# Patient Record
Sex: Male | Born: 1945
Health system: Southern US, Community
[De-identification: ages and names within clinical notes are randomized; demographics above are authoritative.]

## PROBLEM LIST (undated history)

## (undated) DIAGNOSIS — I4891 Unspecified atrial fibrillation: Secondary | ICD-10-CM

## (undated) DIAGNOSIS — Q211 Atrial septal defect: Secondary | ICD-10-CM

## (undated) DIAGNOSIS — E785 Hyperlipidemia, unspecified: Secondary | ICD-10-CM

## (undated) DIAGNOSIS — I6529 Occlusion and stenosis of unspecified carotid artery: Secondary | ICD-10-CM

## (undated) DIAGNOSIS — I499 Cardiac arrhythmia, unspecified: Secondary | ICD-10-CM

## (undated) DIAGNOSIS — N390 Urinary tract infection, site not specified: Secondary | ICD-10-CM

## (undated) DIAGNOSIS — M1990 Unspecified osteoarthritis, unspecified site: Secondary | ICD-10-CM

## (undated) DIAGNOSIS — Q2112 Patent foramen ovale: Secondary | ICD-10-CM

## (undated) DIAGNOSIS — I639 Cerebral infarction, unspecified: Secondary | ICD-10-CM

## (undated) DIAGNOSIS — G43109 Migraine with aura, not intractable, without status migrainosus: Secondary | ICD-10-CM

## (undated) HISTORY — DX: Cerebral infarction, unspecified: I63.9

## (undated) HISTORY — PX: TOOTH EXTRACTION: SUR596

## (undated) HISTORY — PX: JOINT REPLACEMENT: SHX530

## (undated) HISTORY — PX: TONSILLECTOMY: SUR1361

## (undated) HISTORY — DX: Occlusion and stenosis of unspecified carotid artery: I65.29

## (undated) HISTORY — PX: MOUTH SURGERY: SHX715

## (undated) HISTORY — DX: Unspecified osteoarthritis, unspecified site: M19.90

---

## 1999-07-04 ENCOUNTER — Encounter (INDEPENDENT_AMBULATORY_CARE_PROVIDER_SITE_OTHER): Payer: Self-pay

## 1999-07-04 ENCOUNTER — Ambulatory Visit (HOSPITAL_COMMUNITY): Admission: RE | Admit: 1999-07-04 | Discharge: 1999-07-04 | Payer: Self-pay | Admitting: Gastroenterology

## 2005-03-15 DIAGNOSIS — I63412 Cerebral infarction due to embolism of left middle cerebral artery: Secondary | ICD-10-CM | POA: Diagnosis present

## 2006-03-13 DIAGNOSIS — I639 Cerebral infarction, unspecified: Secondary | ICD-10-CM

## 2006-03-13 HISTORY — DX: Cerebral infarction, unspecified: I63.9

## 2006-04-17 ENCOUNTER — Encounter: Payer: Self-pay | Admitting: Internal Medicine

## 2006-04-18 ENCOUNTER — Inpatient Hospital Stay (HOSPITAL_COMMUNITY): Admission: AD | Admit: 2006-04-18 | Discharge: 2006-04-20 | Payer: Self-pay | Admitting: Neurology

## 2006-04-18 ENCOUNTER — Encounter: Payer: Self-pay | Admitting: Cardiology

## 2006-04-20 ENCOUNTER — Ambulatory Visit: Payer: Self-pay | Admitting: Internal Medicine

## 2006-04-24 ENCOUNTER — Encounter: Admission: RE | Admit: 2006-04-24 | Discharge: 2006-05-07 | Payer: Self-pay | Admitting: *Deleted

## 2007-02-18 ENCOUNTER — Ambulatory Visit: Payer: Self-pay | Admitting: Surgery

## 2007-08-19 ENCOUNTER — Ambulatory Visit: Payer: Self-pay | Admitting: Surgery

## 2008-08-06 ENCOUNTER — Ambulatory Visit: Payer: Self-pay | Admitting: Surgery

## 2009-07-12 ENCOUNTER — Ambulatory Visit: Payer: Self-pay | Admitting: Surgery

## 2009-07-13 ENCOUNTER — Encounter
Admission: RE | Admit: 2009-07-13 | Discharge: 2009-08-11 | Payer: Self-pay | Source: Home / Self Care | Admitting: Family Medicine

## 2010-03-22 ENCOUNTER — Emergency Department (HOSPITAL_BASED_OUTPATIENT_CLINIC_OR_DEPARTMENT_OTHER)
Admission: EM | Admit: 2010-03-22 | Discharge: 2010-03-22 | Payer: Self-pay | Source: Home / Self Care | Admitting: Emergency Medicine

## 2010-07-14 ENCOUNTER — Other Ambulatory Visit (INDEPENDENT_AMBULATORY_CARE_PROVIDER_SITE_OTHER): Payer: Medicare Other

## 2010-07-14 DIAGNOSIS — I6529 Occlusion and stenosis of unspecified carotid artery: Secondary | ICD-10-CM

## 2010-07-19 NOTE — Procedures (Unsigned)
CAROTID DUPLEX EXAM  INDICATION:  Carotid disease.  HISTORY: Diabetes:  No. Cardiac:  PFO, a patent foramen ovale. Hypertension:  No. Smoking:  Previous. Previous Surgery:  No. CV History:  Cerebrovascular accident in 2008.  Patient is currently asymptomatic. Amaurosis Fugax No, Paresthesias No, Hemiparesis No.                                      RIGHT             LEFT Brachial systolic pressure:         108               110 Brachial Doppler waveforms:         WNL               WNL Vertebral direction of flow:        Antegrade         Antegrade DUPLEX VELOCITIES (cm/sec) CCA peak systolic                   96                85 ECA peak systolic                   54                74 ICA peak systolic                   59                74 ICA end diastolic                   22                26 PLAQUE MORPHOLOGY:                  Heterogenous      Heterogenous PLAQUE AMOUNT:                      Mild              Mild PLAQUE LOCATION:                    Distal CCA, ECA   ICA  IMPRESSION: 1. Bilaterally, no hemodynamically significant stenosis. 2. Bilateral mild intimal thickening within the common carotid     arteries. 3. Study is stable compared to previous.  ___________________________________________ V. Charlena Cross, MD  OD/MEDQ  D:  07/14/2010  T:  07/15/2010  Job:  [id number]

## 2010-07-26 NOTE — Procedures (Signed)
CAROTID DUPLEX EXAM   INDICATION:  Followup carotid artery disease.   HISTORY:  Diabetes:  No  Cardiac:  PFO  Hypertension:  No  Smoking:  Previous  Previous Surgery:  No  CV History:  CVA in February 2008.  Amaurosis Fugax No.  Paresthesias No, Hemiparesis No                                       RIGHT             LEFT  Brachial systolic pressure:         119               123  Brachial Doppler waveforms:         WNL               WNL  Vertebral direction of flow:        Antegrade         Antegrade  DUPLEX VELOCITIES (cm/sec)  CCA peak systolic                   116               110  ECA peak systolic                   71                72  ICA peak systolic                   67                120  ICA end diastolic                   26                37  PLAQUE MORPHOLOGY:                  Homogenous        Heterogenous  PLAQUE AMOUNT:                      Mild              Mild  PLAQUE LOCATION:                    ICA               ICA   IMPRESSION:  1. Right internal carotid artery shows evidence of 1-19% stenosis.  2. Left internal carotid artery shows evidence of 40-59% stenosis.  3. No significant changes from previous study.   ___________________________________________  V. Charlena Cross, MD   AS/MEDQ  D:  08/06/2008  T:  08/06/2008  Job:  [id number]

## 2010-07-26 NOTE — Procedures (Signed)
CAROTID DUPLEX EXAM   INDICATION:  Carotid artery disease, CVA.   HISTORY:  Diabetes:  No.  Cardiac:  PFO.  Hypertension:  No.  Smoking:  No.  Previous Surgery:  No.  CV History:  CVA in 04/2006, lost ability to read at the time.  Amaurosis Fugax:  No, Paresthesias:  No, Hemiparesis:  No                                       RIGHT             LEFT  Brachial systolic pressure:         113               110  Brachial Doppler waveforms:         Biphasic          Biphasic  Vertebral direction of flow:        Antegrade         Antegrade  DUPLEX VELOCITIES (cm/sec)  CCA peak systolic                   130               117  ECA peak systolic                   95                107  ICA peak systolic                   90                135  ICA end diastolic                   34                40  PLAQUE MORPHOLOGY:                  Homogenous        Homogenous  PLAQUE AMOUNT:                      Very mild         Mild  PLAQUE LOCATION:                    ICA               ICA   IMPRESSION:  Right internal carotid artery shows evidence of 1% to 19%  stenosis.   Left internal carotid artery shows evidence of 40% of 59% stenosis.   ___________________________________________  V. Charlena Cross, MD   AS/MEDQ  D:  02/18/2007  T:  02/19/2007  Job:  [id number]

## 2010-07-26 NOTE — Procedures (Signed)
CAROTID DUPLEX EXAM   INDICATION:  Follow up carotid artery disease.   HISTORY:  Diabetes:  No.  Cardiac:  PFO.  Hypertension:  No.  Smoking:  No.  Previous Surgery:  No.  CV History:  CVA in February, 2008.  Amaurosis Fugax No, Paresthesias No, Hemiparesis No                                       RIGHT             LEFT  Brachial systolic pressure:         102               98  Brachial Doppler waveforms:         Normal            Normal  Vertebral direction of flow:        Antegrade         Antegrade  DUPLEX VELOCITIES (cm/sec)  CCA peak systolic                   114               112  ECA peak systolic                   68                87  ICA peak systolic                   55                116  ICA end diastolic                   21                46  PLAQUE MORPHOLOGY:                  Homogenous        Heterogenous  PLAQUE AMOUNT:                      Minimal           Mild  PLAQUE LOCATION:                    ICA               ICA   IMPRESSION:  1. 1-19% stenosis of the right internal carotid artery.  2. 40-59% stenosis of the left internal carotid artery.  3. No significant change noted from the previous examination on      02/18/07.   ___________________________________________  V. Charlena Cross, MD   CH/MEDQ  D:  08/19/2007  T:  08/19/2007  Job:  [id number]

## 2010-07-26 NOTE — Assessment & Plan Note (Signed)
OFFICE VISIT   TIN, ENGRAM T  DOB:  [birth year]                                       02/18/2007  [id number]#:[id number]   REASON FOR VISIT:  Evaluate carotid disease.   HISTORY:  This is a 65 year old gentleman whom, in February 2008,  presented to the emergency department with an acute left posterior  cerebral artery embolic infarct, thought to be from a patent foramen  ovale.  The patient received intravenous TPA at that time, and has had  near resolution of his symptoms.  He says that he does still have some  difficulty with his reading speed and name recall, however, he has had  no motor or sensory problems.  During his workup, he was found to have a  patent foramen ovale which was thought to be the reason for his stroke.  He also during his workup had a MRA/MRI, which showed approximately 50%  stenosis of the left carotid bifurcation with mild irregularity.  He is  sent to me for further management of his carotid disease.  The patient  is being maintained on Aggrenox.  He was offered a PFO closure device,  however, has not proceeded with this.   REVIEW OF SYSTEMS:  GENERAL:  Negative.  CARDIAC:  Negative.  PULMONARY:  Negative.  GI:  Negative.  GU:  Negative.  VASCULAR:  Please see history.  NEURO:  Please see history.  ORTHO:  Negative.  PSYCH:  Negative.  ENT:  Negative.  HEME:  Negative.   PAST MEDICAL HISTORY:  Positive for stroke, as above, otherwise  negative.   FAMILY HISTORY:  Negative.   SOCIAL HISTORY:  He is married with 2 children.  He is retired.  He does  not smoke.  Has a history of smoking, but quit in 1972.  Drinks 2  glasses of wine daily.   MEDICATIONS:  Aggrenox twice daily and Zocor once daily.   ALLERGIES:  No known drug allergies.   PHYSICAL EXAMINATION:  Heart rate 68, blood pressure 113/77, blood  pressure in the right arm is 113/78.  Generally, he is well-appearing in  no acute distress.  HEENT is  normocephalic, atraumatic.  Sclerae are  anicteric.  Pupils are equal and round.  Neck is supple with no JVD.  No  mass.  No carotid bruits.  Cardiovascular is regular rate and rhythm.  No murmurs, rubs, or gallops.  Pulmonary is clear to auscultation  bilaterally.  His abdomen is soft, nontender.  Neuro:  Cranial nerves II-  XII are grossly intact.  Extremities are warm and well-perfused.  Psych:  He is alert and oriented x3.  Skin is without rash.   DIAGNOSTIC STUDIES:  The patient had a duplex today which revealed right  internal carotid having 1% to 19% stenosis, and the left internal  carotid revealing 40% to 59% stenosis.   ASSESSMENT:  Status post left posterior cerebral artery stroke and left  internal carotid stenosis   PLAN:  At this point in time, the patient's duplex has a 40% to 59%  stenosis.  Given that he is now approximately 9 months out from his  stroke, I feel that continued observation is warranted, rather than end-  arterectomy.  I had an extensive conversation with the patient.  We are  not sure as to the exact underlying etiology of his stroke,  however, he  does carotid disease, and this certainly is a possibility.  He follows  into the 40% to 59% range, and therefore, if we need to figure out  exactly the next step, we could proceed with arteriogram.  Currently, I  would only treat him with end-arterectomy if he were symptomatic and had  a greater than 50% stenosis.  Since he is 9 months out and is currently  asymptomatic, I feel it is reasonable to continue with medication, and  follow his duplex every 6 months.  The patient is agreeable to this.  He  was told if he has any other signs and symptoms to get in touch with me  immediately, and to go to the emergency department.  Otherwise, he will  follow up with me in 6 months.   Jorge Ny, MD  Electronically Signed   VWB/MEDQ  D:  02/18/2007  T:  02/19/2007  Job:  [id number]   cc:   Chales Salmon. Abigail Miyamoto,  M.D.

## 2010-07-26 NOTE — Procedures (Signed)
CAROTID DUPLEX EXAM   INDICATION:  Carotid disease.   HISTORY:  Diabetes:  No.  Cardiac:  PFO.  Hypertension:  No.  Smoking:  Previous.  Previous Surgery:  No.  CV History:  CVA in 2008, currently asymptomatic.  Amaurosis Fugax No, Paresthesias No, Hemiparesis No.                                       RIGHT             LEFT  Brachial systolic pressure:         108               110  Brachial Doppler waveforms:         Normal            Normal  Vertebral direction of flow:        Antegrade         Antegrade  DUPLEX VELOCITIES (cm/sec)  CCA peak systolic                   103               83  ECA peak systolic                   67                86  ICA peak systolic                   53                122  ICA end diastolic                   23                42  PLAQUE MORPHOLOGY:                  Heterogenous      Heterogenous  PLAQUE AMOUNT:                      Minimal           Mild  PLAQUE LOCATION:                    Bifurcation       ICA   IMPRESSION:  1. 1% to 39% stenosis of the right internal carotid artery/bifurcation      region.  2. 40% to 59% stenosis of the left internal carotid artery.  3. No significant change noted when compared to the previous      examination on 08/06/2008.   ___________________________________________  V. Charlena Cross, MD   CH/MEDQ  D:  07/12/2009  T:  07/13/2009  Job:  [id number]

## 2010-07-26 NOTE — Assessment & Plan Note (Signed)
OFFICE VISIT   Robert Mclaughlin, Robert Mclaughlin  DOB:  April 20, 1945                                       08/19/2007  [id number]#:[id number]   REASON FOR VISIT:  Evaluate carotid disease.   HISTORY:  This is a 65 year old gentleman who suffered a left posterior  cerebral artery infarct in February of 2008.  This was thought to be due  to a patent foramen ovale.  The patient was treated with intravenous TPA  and has had near-resolution of his symptoms which consisted of  difficulty with his reading speed, as well as name recall.  He did not  have any motor or sensory deficits.  During his workup, which included  MRA and MRI revealed 50% stenosis of his left carotid bifurcation.  He  had a duplex that revealed 40-59% stenosis of the left internal carotid  artery.  At that time, given that he was 8 months post event and  asymptomatic, we elected to follow his carotid with a duplex and I did  not recommend endarterectomy, but rather medical management which was to  control his blood pressure, cholesterol, and continue his Aggrenox.  He  comes back in today.  He has had no change in his symptoms.  He states  that his  name recall, and reading speed have improved.  He does  complain of some pain in his chest.  However, he has been doing pull-  ups, and this is what sounds like aggravated it.   PHYSICAL EXAMINATION:  Blood pressure is 110/73 in the left arm and  109/75 in the right, pulse 74.  General:  Well-appearing, no acute  distress.  Cardiovascular:  Regular rate and rhythm.  No murmurs, rubs  or gallops.  Pulmonary:  Lungs clear bilaterally.  There are no carotid  bruits.   DIAGNOSTIC STUDIES:  The patient's carotid duplex was performed today  and shows 40-59% stenosis of left internal carotid artery and 1-19%  stenosis of the right.  There have been no changes since his most recent  study in December.   ASSESSMENT/PLAN:  Asymptomatic left carotid stenosis.   PLAN:  The  patient to follow up in my office in 6 months with a repeat  carotid duplex.  If there has been no interval change I would recommend  carotid ultrasound every year unless he develops symptoms or has change  in his stenoses.  Again, he will follow up with me in 6 months.   Jorge Ny, MD  Electronically Signed   VWB/MEDQ  D:  08/19/2007  Mclaughlin:  08/20/2007  Job:  [id number]   cc:   Chales Salmon. Abigail Miyamoto, M.D.

## 2010-07-26 NOTE — Assessment & Plan Note (Signed)
OFFICE VISIT   Robert Mclaughlin, Robert Mclaughlin  DOB:  08-Nov-1945                                       07/12/2009  EAVWU#:[id number]   Patient returns today for follow-up of his carotid disease.  The patient  suffered a left posterior cerebral artery infarct in February 2008  thought to be secondary to a patent foramen ovale.  He was treated with  IV t-PA and had near-resolution of his symptoms.  He only now suffers  with some difficulty in his reading speed as well as some name recall,  but both of these have improved since I last saw him.   I have been following his extracranial carotid disease.  He comes in  today for a repeat ultrasound.  He has not had any symptoms in the last  year, specifically no numbness, weakness, slurred speech, or amaurosis  fugax.   PHYSICAL EXAMINATION:  Heart rate 67, blood pressure 107/65, O2 sats  95%.  General:  He is well-appearing in no distress.  HEENT:  Within  normal limits.  Lungs are clear bilaterally.  No wheezes or rhonchi.  Cardiovascular:  Regular rate and rhythm.  No carotid bruits.  He has  palpable radial pulses.  Abdomen is soft, nontender.  No pulsatile mass.   DIAGNOSTIC STUDIES:  I have independently reviewed his ultrasound.  This  reveals 1% to 39% stenosis of the right internal carotid artery and 40%  to 59% stenosis of the left.  There is no significant change from his  study a year ago.   ASSESSMENT:  Asymptomatic carotid disease.   PLAN:  The patient will follow up my office in 1 year with a repeat  ultrasound.     Jorge Ny, MD  Electronically Signed   VWB/MEDQ  D:  07/12/2009  T:  07/13/2009  Job:  [id number]   cc:   Chales Salmon. Abigail Miyamoto, M.D.

## 2010-07-29 NOTE — H&P (Signed)
NAME:  Robert Mclaughlin, Robert Mclaughlin NO.:  [id number]   MEDICAL RECORD NO.:  [id number]          PATIENT TYPE:  EMS   LOCATION:  ED                           FACILITY:  [facility]   PHYSICIAN:  Melvyn Novas, M.D.  DATE OF BIRTH:  [birth year]   DATE OF ADMISSION:  04/17/2006  DATE OF DISCHARGE:                              HISTORY & PHYSICAL   CODE STROKE ADMISSION:  Mr. Hodes is a 65 year old right-handed married Caucasian  gentleman, retired but partially working, who presented with symptoms of  alexia and anomia at approximately 13 hours.  He stated that he had been  feeling well all morning, exercised in the morning at a local gym.  Was  reading magazines at the gym and talking to other people working out  there.  Upon returning home at about 12 to 12:30, he noted suddenly  difficulty with comprehending and reading his E-mails after he opened  the computer.  He could not read aloud, either.  He paged his wife and  left a voice mail stating that he had difficulties requesting a call-  back.  He for a moment laid down, and he estimates that about 20 minutes  later, his wife came home after receiving his call and brought him to  the ER at [facility].  Here he had been seen by Dr. Gerlene Fee, who described over the phone the following symptoms.  Unable to  name objects.  Unable to read.  Able to write.  Able to calculate and  count.  There were no motor or sensory deficits.  The speech was intact,  although the patient appeared somewhat frustrated.  He had a normal  cranial nerve exam, stable vital signs, and was afebrile.  He has no  history of trauma, tumor, or seizure.   PAST MEDICAL HISTORY:  The patient states that he has never been  severely sick.  He just recently on a trip to [place] was treated for the  flu, but he has not had hospitalizations or surgeries.  He is on no  active medications and has no known drug allergies.  He drinks  occasionally wine,  but he is a nonsmoker.  He is married.  Has one  healthy daughter who is a Engineer, civil (consulting).  He states that his wife works in  health care administration.   He denies any distress.  No fever, headaches, nausea, dizziness, or any  focal tenderness can be found.   PHYSICAL EXAMINATION:  VITAL SIGNS:  Blood pressure 130/72, 70 beats per  minute, regular heart rate, 18 respiratory rate per minute, temperature  97.8.  LUNGS:  Clear to auscultation.  COR:  Regular rate and rhythm with no murmur, no bruits.  Patient was  examined by Dr. Radonna Ricker, internal medicine.  He showed no abdominal  tenderness, rigor, or spinal tenderness, according to Dr. Louanne Belton notes.  NEUROLOGIC:  Cranial nerves examination shows full focal movements, no  droop, facial sensation, or extraocular movement abnormalities.  He has  full visual changes to bilateral simultaneous stimulation.  He denies any tongue or uvular sensory abnormalities, and his tongue and  uvula move midline.  He has no swallowing difficulties.  Motor  examination shows 5/5 strength, equal grip strength, normal tone and  strength ,no tremor or rigor.  Deep tendon reflexes are symmetric.  Babinski's response could not be elicited.  Sensory is intact to  pinprick and touch.  The patient shows no ataxia or dysmetria.  Focal stroke symptoms of alexia and anomia without aphasia, agraphia and  acalculia, likely pericallosal, and an MRI is recommended at this time,  pending.     A CT was negative.  The patient would not score on the NIH stroke  scale, but the deficit would definitely impair his ability to have a  normal life and life quality. his deficit is alexia without agraphia, be  NIH 1 point.    For this reason, t-PA was given at two-thirds of the normal dose, per  IV, since the patient was just within the three hour window for this  therapy.   DIAGNOSES:  Likely pericallosal stroke, left hemisphere.    Melvyn Novas M.D. cc Dr Abigail Miyamoto       Melvyn Novas, M.D.  Electronically Signed     CD/MEDQ  D:  04/17/2006  T:  04/17/2006  Job:  [id number]

## 2010-07-29 NOTE — Discharge Summary (Signed)
NAME:  Robert Mclaughlin, Robert Mclaughlin NO.:  [id number]   MEDICAL RECORD NO.:  [id number]          PATIENT TYPE:  INP   LOCATION:  3015                         FACILITY:  [facility]   PHYSICIAN:  Annie Main, N.P.      DATE OF BIRTH:  1945-08-20   DATE OF ADMISSION:  04/18/2006  DATE OF DISCHARGE:  04/20/2006                               DISCHARGE SUMMARY   DIAGNOSES AT TIME OF DISCHARGE:  1. Left posterior cerebral artery infarct, embolic, likely secondary      to patent foramen ovale, though this is not completely confirmed at      time of discharge.  2. Dyslipidemia.  3. Left internal carotid artery stenosis, up to 80%.  4. Large patent foramen ovale.   MEDICINES AT TIME OF DISCHARGE:  1. Zocor 20 mg a day.  2. Aggrenox 1 p.o. q. h.s. times 2 weeks.  3. __________  mg b.i.d.  4. Aspirin 81 mg q. a.m.  5. __________ mg, just have her start it at 1 hour before Aggrenox      times 1 week only.   STUDIES PERFORMED:  1. CT of the brain on admission showed no acute abnormality.  2. MRI of the brain showed an acute left anterior cerebral artery      infarct involving the inferomedial aspect of the left temporal      lobe.  There is no evidence for hemorrhage or mass affect.  3. MRA of the neck showed 50% stenosis proximal left internal carotid      artery.  4. MRA of the head showed unremarkable __________ .  5. A 2D echocardiogram showed EF of 60% to 65% and no left ventricular      regional wall motion abnormality.  There was a __________  aortic      regurgitation.  __________  6. Transesophageal echocardiogram showed EF of 60% with mildly      __________  in the intraatrial septum with prominent __________ .  7. EKG shows normal sinus rhythm.   LABORATORY STUDIES:  CBC normal.  Chemistry normal.  Coagulation studies  normal.  Liver function tests with alkaline phosphatase 33; otherwise,  normal.  Cholesterol 187, triglycerides 58, HDL 40, LDL 133.  Cardiac  enzymes were  negative.  Electrolytes normal.  Urinalysis with 7-10 white  blood cells, 3-6 red cells, small leukocyte esterase.  Hemoglobin A1c  5.3.  __________ .   HISTORY OF PRESENT ILLNESS:  [name] is a 65 year old right-  handed Caucasian male who was returning __________ .  He presents with  symptoms of __________  and anomia at approximately 13 hours of onset.  He states that he has been __________  all morning.  __________  He was  reading magazines and talking to other people.  He says that after work,  __________  upon returning home, __________  he noted __________  difficulty with __________ and requested a call back.  He laid down for  a moment, and he estimates that about 20 minutes later his wife came  home after receiving the call.  She brought him to the  __________  emergency room where he was seen by Dr. Charolotte Capuchin.  He describes  inability to __________  arrived.  He was able to calculate and count.  He had no motor or sensory deficits.  Speech was intact.  He had no  history of tremors or seizures.  A CT of the brain was unremarkable.  He  was transferred to [facility] and admitted for further evaluation.  He  did arrive within the 3 hour window and was thought to be a __________  candidate.  __________   HOSPITAL COURSE:  An MRI of the brain did show a left ICA infarction.  We found an incidental left 60-80% __________ .  Transesophageal  echocardiogram showed a very large patent foramen ovale with positive  __________  right to left shunt at rest.  The patient was placed on  Aggrenox for secondary stroke prevention as well as Zocor for  dyslipidemia.  He has been advised to restrict his activity and will be  discharged home.  The patient did have a urinalysis that was abnormal  and urine culture was pending at time of discharge.  We will call  patient with results since they were abnormal.   CONDITION ON DISCHARGE:  The patient alert and oriented x3.  He is   comfortable.  His naming and reading has improved, but still not normal.  His head is normocephalic, atraumatic.  Extraocular movements are  intact.  Face is symmetric.  Heart is regular.  Respiratory:  Clear to  auscultation.  Abdomen:  Soft and nondistended.  His strength is normal.  Sensation __________ .   DISCHARGE PLAN:  1. Discharged home with his wife.  2. Aggrenox for secondary stroke prevention.  3. Use Zocor.  4. Follow up with primary care physician for risk factor control.  5. Follow up with Dr. __________ .  The patient has been instructed to      call and arrange a __________  study and outpatient MRI.      __________  He and his wife verbalize understanding.   DICTATED BY:  Annie Main, N.P.      Annie Main, N.P.     SB/MEDQ  D:  04/20/2006  T:  04/22/2006  Job:  [id number]   cc:   Cristy Hilts. Jacinto Halim, MD

## 2011-04-05 DIAGNOSIS — M25569 Pain in unspecified knee: Secondary | ICD-10-CM | POA: Diagnosis not present

## 2011-04-18 DIAGNOSIS — R972 Elevated prostate specific antigen [PSA]: Secondary | ICD-10-CM | POA: Diagnosis not present

## 2011-06-02 DIAGNOSIS — K644 Residual hemorrhoidal skin tags: Secondary | ICD-10-CM | POA: Diagnosis not present

## 2011-06-19 ENCOUNTER — Other Ambulatory Visit: Payer: Self-pay | Admitting: Vascular Surgery

## 2011-06-20 DIAGNOSIS — J069 Acute upper respiratory infection, unspecified: Secondary | ICD-10-CM | POA: Diagnosis not present

## 2011-07-17 ENCOUNTER — Encounter: Payer: Self-pay | Admitting: Surgery

## 2011-07-21 ENCOUNTER — Encounter: Payer: Self-pay | Admitting: Neurosurgery

## 2011-07-24 ENCOUNTER — Encounter: Payer: Self-pay | Admitting: Neurosurgery

## 2011-07-24 ENCOUNTER — Ambulatory Visit (INDEPENDENT_AMBULATORY_CARE_PROVIDER_SITE_OTHER): Payer: Medicare Other | Admitting: Neurosurgery

## 2011-07-24 ENCOUNTER — Ambulatory Visit (INDEPENDENT_AMBULATORY_CARE_PROVIDER_SITE_OTHER): Payer: Medicare Other | Admitting: *Deleted

## 2011-07-24 VITALS — BP 141/83 | HR 64 | Resp 16 | Ht 70.0 in | Wt 197.4 lb

## 2011-07-24 DIAGNOSIS — I6529 Occlusion and stenosis of unspecified carotid artery: Secondary | ICD-10-CM | POA: Diagnosis not present

## 2011-07-24 NOTE — Progress Notes (Signed)
Addended by: Sharee Pimple on: 07/24/2011 02:33 PM   Modules accepted: Orders

## 2011-07-24 NOTE — Progress Notes (Signed)
[facility] [place] HISTORY AND PHYSICAL   CC: Annual carotid duplex Referring Physician:  Brabham  History of Present Illness: 66 year old male patient of Dr. Myra Gianotti with a known history of carotid disease. Patient denies any signs or symptoms CVA, TIA, amaurosis or word finding difficulty. Patient reports only joint problems  for which he sees an orthopedist.  Past Medical History  Diagnosis Date  . Arthritis   . Carotid artery occlusion   . Stroke 2008    ROS: [x]  Positive   [ ]  Denies    General: [ ]  Weight loss, [ ]  Fever, [ ]  chills Neurologic: [ ]  Dizziness, [ ]  Blackouts, [ ]  Seizure [ ]  Stroke, [ ]  "Mini stroke", [ ]  Slurred speech, [ ]  Temporary blindness; [ ]  weakness in arms or legs, [ ]  Hoarseness Cardiac: [ ]  Chest pain/pressure, [ ]  Shortness of breath at rest [ ]  Shortness of breath with exertion, [ ]  Atrial fibrillation or irregular heartbeat Vascular: [ ]  Pain in legs with walking, [ ]  Pain in legs at rest, [ ]  Pain in legs at night,  [ ]  Non-healing ulcer, [ ]  Blood clot in vein/DVT,   Pulmonary: [ ]  Home oxygen, [ ]  Productive cough, [ ]  Coughing up blood, [ ]  Asthma,  [ ]  Wheezing Musculoskeletal:  [ ]  Arthritis, [ ]  Low back pain, [ ]  Joint pain Hematologic: [ ]  Easy Bruising, [ ]  Anemia; [ ]  Hepatitis Gastrointestinal: [ ]  Blood in stool, [ ]  Gastroesophageal Reflux/heartburn, [ ]  Trouble swallowing Urinary: [ ]  chronic Kidney disease, [ ]  on HD - [ ]  MWF or [ ]  TTHS, [ ]  Burning with urination, [ ]  Difficulty urinating Skin: [ ]  Rashes, [ ]  Wounds Psychological: [ ]  Anxiety, [ ]  Depression   Social History History  Substance Use Topics  . Smoking status: Former Smoker    Quit date: 03/13/1969  . Smokeless tobacco: Not on file  . Alcohol Use: Yes     2 drinks per day    Family History Family History  Problem Relation Age of Onset  . Heart disease Mother     Heart Disease before age 37    No Known Allergies  Current  Outpatient Prescriptions  Medication Sig Dispense Refill  . dipyridamole-aspirin (AGGRENOX) 25-200 MG per 12 hr capsule Take 1 capsule by mouth 2 (two) times daily.      . Multiple Vitamin (MULTIVITAMIN) tablet Take 1 tablet by mouth daily.      . simvastatin (ZOCOR) 20 MG tablet Take 20 mg by mouth every evening.        Physical Examination  Filed Vitals:   07/24/11 1132  BP: 141/83  Pulse: 64  Resp: 16    Body mass index is 28.32 kg/(m^2).  General:  WDWN in NAD Gait: Normal HEENT: WNL Eyes: Pupils equal Pulmonary: normal non-labored breathing , without Rales, rhonchi,  wheezing Cardiac: RRR, without  Murmurs, rubs or gallops; Abdomen: soft, NT, no masses Skin: no rashes, ulcers noted  Vascular Exam Pulses: 2+ radial pulses bilaterally Carotid bruits: Patient has bilateral carotid pulses to auscultation no bruits are heard Extremities without ischemic changes, no Gangrene , no cellulitis; no open wounds;  Musculoskeletal: no muscle wasting or atrophy   Neurologic: A&O X 3; Appropriate Affect ; SENSATION: normal; MOTOR FUNCTION:  moving all extremities equally. Speech is fluent/normal  Non-Invasive Vascular Imaging CAROTID DUPLEX 07/24/2011  Right ICA 20 - 39 % stenosis Left ICA 20 - 39 % stenosis  ASSESSMENT/PLAN: Asymptomatic very mild carotid stenosis. The patient return in one year for repeat carotid duplex and be seen in my clinic, his questions were encouraged and answered he is in agreement with this plan.  Lauree Chandler ANP  Clinic MD: Myra Gianotti

## 2011-07-31 NOTE — Procedures (Unsigned)
CAROTID DUPLEX EXAM  INDICATION:  Carotid disease.  HISTORY: Diabetes:  No. Cardiac:  PFO. Hypertension:  No. Smoking:  Previous. Previous Surgery:  No. CV History:  CVA history, currently asymptomatic. Amaurosis Fugax No, Paresthesias No, Hemiparesis No                                      RIGHT             LEFT Brachial systolic pressure:         123               122 Brachial Doppler waveforms:         Normal            Normal Vertebral direction of flow:        Antegrade         Antegrade DUPLEX VELOCITIES (cm/sec) CCA peak systolic                   95                83 ECA peak systolic                   58                63 ICA peak systolic                   63                74 ICA end diastolic                   27                30 PLAQUE MORPHOLOGY:                  Heterogenous      Heterogenous PLAQUE AMOUNT:                      Minimal           Minimal PLAQUE LOCATION:                    Bifurcation       ICA   IMPRESSION: 1. No hemodynamically significant stenosis of the right or left     internal carotid artery. 2. Stable in comparison to last study performed on 07/14/2010.       ___________________________________________ V. Charlena Cross, MD  EM/MEDQ  D:  07/25/2011  T:  07/25/2011  Job:  [id number]

## 2011-09-27 DIAGNOSIS — K644 Residual hemorrhoidal skin tags: Secondary | ICD-10-CM | POA: Diagnosis not present

## 2011-09-27 DIAGNOSIS — L570 Actinic keratosis: Secondary | ICD-10-CM | POA: Diagnosis not present

## 2011-09-28 DIAGNOSIS — H2510 Age-related nuclear cataract, unspecified eye: Secondary | ICD-10-CM | POA: Diagnosis not present

## 2011-09-28 DIAGNOSIS — H401290 Low-tension glaucoma, unspecified eye, stage unspecified: Secondary | ICD-10-CM | POA: Diagnosis not present

## 2011-10-02 DIAGNOSIS — I63219 Cerebral infarction due to unspecified occlusion or stenosis of unspecified vertebral arteries: Secondary | ICD-10-CM | POA: Diagnosis not present

## 2011-10-26 DIAGNOSIS — M1710 Unilateral primary osteoarthritis, unspecified knee: Secondary | ICD-10-CM | POA: Diagnosis not present

## 2011-11-17 DIAGNOSIS — M1710 Unilateral primary osteoarthritis, unspecified knee: Secondary | ICD-10-CM | POA: Diagnosis not present

## 2012-01-26 DIAGNOSIS — Z0000 Encounter for general adult medical examination without abnormal findings: Secondary | ICD-10-CM | POA: Diagnosis not present

## 2012-01-26 DIAGNOSIS — E7800 Pure hypercholesterolemia, unspecified: Secondary | ICD-10-CM | POA: Diagnosis not present

## 2012-01-26 DIAGNOSIS — Z125 Encounter for screening for malignant neoplasm of prostate: Secondary | ICD-10-CM | POA: Diagnosis not present

## 2012-01-26 DIAGNOSIS — Z79899 Other long term (current) drug therapy: Secondary | ICD-10-CM | POA: Diagnosis not present

## 2012-01-26 DIAGNOSIS — I679 Cerebrovascular disease, unspecified: Secondary | ICD-10-CM | POA: Diagnosis not present

## 2012-04-08 DIAGNOSIS — H2510 Age-related nuclear cataract, unspecified eye: Secondary | ICD-10-CM | POA: Diagnosis not present

## 2012-04-08 DIAGNOSIS — H401290 Low-tension glaucoma, unspecified eye, stage unspecified: Secondary | ICD-10-CM | POA: Diagnosis not present

## 2012-05-15 DIAGNOSIS — H401290 Low-tension glaucoma, unspecified eye, stage unspecified: Secondary | ICD-10-CM | POA: Diagnosis not present

## 2012-05-15 DIAGNOSIS — H2510 Age-related nuclear cataract, unspecified eye: Secondary | ICD-10-CM | POA: Diagnosis not present

## 2012-05-15 DIAGNOSIS — H04129 Dry eye syndrome of unspecified lacrimal gland: Secondary | ICD-10-CM | POA: Diagnosis not present

## 2012-07-22 ENCOUNTER — Ambulatory Visit: Payer: Federal, State, Local not specified - PPO | Admitting: Neurosurgery

## 2012-07-22 ENCOUNTER — Other Ambulatory Visit (INDEPENDENT_AMBULATORY_CARE_PROVIDER_SITE_OTHER): Payer: Medicare Other | Admitting: *Deleted

## 2012-07-22 DIAGNOSIS — I6529 Occlusion and stenosis of unspecified carotid artery: Secondary | ICD-10-CM | POA: Diagnosis not present

## 2012-07-29 ENCOUNTER — Other Ambulatory Visit: Payer: Self-pay | Admitting: *Deleted

## 2012-07-31 ENCOUNTER — Encounter: Payer: Self-pay | Admitting: Surgery

## 2012-11-21 DIAGNOSIS — H04129 Dry eye syndrome of unspecified lacrimal gland: Secondary | ICD-10-CM | POA: Diagnosis not present

## 2012-11-21 DIAGNOSIS — H2510 Age-related nuclear cataract, unspecified eye: Secondary | ICD-10-CM | POA: Diagnosis not present

## 2012-11-21 DIAGNOSIS — H401290 Low-tension glaucoma, unspecified eye, stage unspecified: Secondary | ICD-10-CM | POA: Diagnosis not present

## 2012-12-11 DIAGNOSIS — L570 Actinic keratosis: Secondary | ICD-10-CM | POA: Diagnosis not present

## 2013-02-10 DIAGNOSIS — Z79899 Other long term (current) drug therapy: Secondary | ICD-10-CM | POA: Diagnosis not present

## 2013-02-10 DIAGNOSIS — Z8673 Personal history of transient ischemic attack (TIA), and cerebral infarction without residual deficits: Secondary | ICD-10-CM | POA: Diagnosis not present

## 2013-02-10 DIAGNOSIS — E7800 Pure hypercholesterolemia, unspecified: Secondary | ICD-10-CM | POA: Diagnosis not present

## 2013-02-13 DIAGNOSIS — M1710 Unilateral primary osteoarthritis, unspecified knee: Secondary | ICD-10-CM | POA: Diagnosis not present

## 2013-04-29 ENCOUNTER — Other Ambulatory Visit: Payer: Self-pay | Admitting: Surgery

## 2013-04-29 DIAGNOSIS — I6529 Occlusion and stenosis of unspecified carotid artery: Secondary | ICD-10-CM

## 2013-05-20 DIAGNOSIS — H401290 Low-tension glaucoma, unspecified eye, stage unspecified: Secondary | ICD-10-CM | POA: Diagnosis not present

## 2013-08-07 ENCOUNTER — Encounter: Payer: Self-pay | Admitting: Family

## 2013-08-08 ENCOUNTER — Ambulatory Visit (INDEPENDENT_AMBULATORY_CARE_PROVIDER_SITE_OTHER): Payer: Medicare Other | Admitting: Family

## 2013-08-08 ENCOUNTER — Ambulatory Visit (HOSPITAL_COMMUNITY)
Admission: RE | Admit: 2013-08-08 | Discharge: 2013-08-08 | Disposition: A | Discharge status: Home / Self Care | Payer: Medicare Other | Source: Ambulatory Visit | Attending: Family | Admitting: Family

## 2013-08-08 ENCOUNTER — Encounter: Payer: Self-pay | Admitting: Family

## 2013-08-08 VITALS — BP 109/74 | HR 65 | Resp 16 | Ht 70.0 in | Wt 186.0 lb

## 2013-08-08 DIAGNOSIS — I6529 Occlusion and stenosis of unspecified carotid artery: Secondary | ICD-10-CM

## 2013-08-08 NOTE — Addendum Note (Signed)
Addended by: Peter Minium K on: 08/08/2013 03:00 PM   Modules accepted: Orders

## 2013-08-08 NOTE — Patient Instructions (Signed)

## 2013-08-08 NOTE — Progress Notes (Signed)
Established Carotid Patient   History of Present Illness  Robert Mclaughlin is a 68 y.o. male patient of Dr. Trula Slade with a known history of carotid disease.  Patient has not had previous carotid artery intervention.  Patient has Positive history of stroke in 2008 as manifested by inability to read for about 4 days while hospitalized.  The patient denies amaurosis fugax or monocular blindness.  The patient  denies facial drooping.  Pt. denies hemiplegia.  The patient denies receptive or expressive aphasia.  Pt. denies extremity weakness. He has had no further stroke or TIA symptoms.  The patient's previous neurologic deficits are resolved. He has a patent foramen ovale. He was released by his neurologist, Dr. Salvatore Decent. Pt denies claudication symptoms with walking, denies non healing wounds. Pt denies tingling, pain, or weakness in arms or hands.  Pt  denies New Medical or Surgical History.  Pt Diabetic: No Pt smoker: former smoker, quit in 1971  Pt meds include: Statin : Yes ASA: No Other anticoagulants/antiplatelets: Plavix   Past Medical History  Diagnosis Date  . Arthritis   . Carotid artery occlusion   . Stroke 2008    Social History History  Substance Use Topics  . Smoking status: Former Smoker    Quit date: 03/13/1969  . Smokeless tobacco: Not on file  . Alcohol Use: Yes     Comment: 2 drinks per day    Family History Family History  Problem Relation Age of Onset  . Heart disease Mother     Heart Disease before age 38    Surgical History No past surgical history on file.  No Known Allergies  Current Outpatient Prescriptions  Medication Sig Dispense Refill  . clopidogrel (PLAVIX) 75 MG tablet daily.      . Multiple Vitamin (MULTIVITAMIN) tablet Take 1 tablet by mouth daily.      . simvastatin (ZOCOR) 20 MG tablet Take 20 mg by mouth every evening.      . dipyridamole-aspirin (AGGRENOX) 25-200 MG per 12 hr capsule Take 1 capsule by mouth 2 (two) times  daily.       No current facility-administered medications for this visit.    Review of Systems : See HPI for pertinent positives and negatives.  Physical Examination  Filed Vitals:   08/08/13 1417 08/08/13 1420  BP: 98/72 109/74  Pulse: 67 65  Resp:  16  Height:  5\' 10"  (1.778 m)  Weight:  186 lb (84.369 kg)  SpO2:  99%   Body mass index is 26.69 kg/(m^2).  General: WDWN male in NAD GAIT: normal Eyes: PERRLA Pulmonary:  Non-labored, CTAB, Negative  Rales, Negative rhonchi, & Negative wheezing.  Cardiac: regular Rhythm ,  Negative detected murmur.  VASCULAR EXAM Carotid Bruits Left Right   Negative Negative   Radial pulses are 2+ palpable and equal.  Gastrointestinal: soft, nontender, BS WNL, no r/g,  negative masses.  Musculoskeletal: Negative muscle atrophy/wasting. M/S 5/5 throughout, Extremities without ischemic changes.  Neurologic: A&O X 3; Appropriate Affect ; SENSATION ;normal;  Speech is normal CN 2-12 intact, Pain and light touch intact in extremities, Motor exam as listed above.   Non-Invasive Vascular Imaging CAROTID DUPLEX 08/08/2013   CEREBROVASCULAR DUPLEX EVALUATION    INDICATION: Carotid disease    PREVIOUS INTERVENTION(S):     DUPLEX EXAM:     RIGHT  LEFT  Peak Systolic Velocities (cm/s) End Diastolic Velocities (cm/s) Plaque LOCATION Peak Systolic Velocities (cm/s) End Diastolic Velocities (cm/s) Plaque  133 23  CCA PROXIMAL 118 21   110 22  CCA MID 106 26   80 22 HT CCA DISTAL 77 22   60 9  ECA 78 11   41 13  ICA PROXIMAL 100 38 HT  77 28  ICA MID 73 23   57 21  ICA DISTAL 73 28     0.51 ICA / CCA Ratio (PSV) 1.3  Antegrade Vertebral Flow Antegrade  161 Brachial Systolic Pressure (mmHg) 096  Multiphasic (subclavian artery) Brachial Artery Waveforms Multiphasic (subclavian artery)    Plaque Morphology:  HM =  Homogeneous, HT = Heterogeneous, CP = Calcific Plaque, SP = Smooth Plaque, IP = Irregular Plaque     ADDITIONAL FINDINGS: No significant stenosis of the bilateral external or common carotid arteries.    IMPRESSION: Doppler velocities suggest no right internal carotid artery and a less than 40% stenosis of the left proximal internal carotid artery.    Compared to the previous exam:  No significant change in the bilateral carotid artery velocities noted when compared to the previous exam on 07/22/12.       Assessment: CHADEN DOOM is a 68 y.o. male who presents with asymptomatic,  no right internal carotid artery and a less than 40% stenosis of the left proximal internal carotid artery. No significant change in the bilateral carotid artery velocities noted when compared to the previous exam on 07/22/12.   Plan:  Follow-up in 1 year with Carotid Duplex scan.   I discussed in depth with the patient the nature of atherosclerosis, and emphasized the importance of maximal medical management including strict control of blood pressure, blood glucose, and lipid levels, obtaining regular exercise, and continued cessation of smoking.  The patient is aware that without maximal medical management the underlying atherosclerotic disease process will progress, limiting the benefit of any interventions. The patient was given information about stroke prevention and what symptoms should prompt the patient to seek immediate medical care. Thank you for allowing [place] to participate in this patient's care.  Clemon Chambers, RN, MSN, FNP-C [facility] [place] Office: [phone]  Clinic Physician: Bridgett Larsson on call  08/08/2013 2:12 PM

## 2013-08-11 ENCOUNTER — Other Ambulatory Visit (HOSPITAL_COMMUNITY): Payer: Medicare Other

## 2013-08-11 ENCOUNTER — Ambulatory Visit: Payer: Medicare Other | Admitting: Surgery

## 2013-08-18 DIAGNOSIS — D128 Benign neoplasm of rectum: Secondary | ICD-10-CM | POA: Diagnosis not present

## 2013-08-18 DIAGNOSIS — D126 Benign neoplasm of colon, unspecified: Secondary | ICD-10-CM | POA: Diagnosis not present

## 2013-08-18 DIAGNOSIS — Z09 Encounter for follow-up examination after completed treatment for conditions other than malignant neoplasm: Secondary | ICD-10-CM | POA: Diagnosis not present

## 2013-08-18 DIAGNOSIS — K620 Anal polyp: Secondary | ICD-10-CM | POA: Diagnosis not present

## 2013-08-18 DIAGNOSIS — Z86010 Personal history of colonic polyps: Secondary | ICD-10-CM | POA: Diagnosis not present

## 2013-08-22 DIAGNOSIS — H269 Unspecified cataract: Secondary | ICD-10-CM | POA: Diagnosis not present

## 2013-08-22 DIAGNOSIS — H04129 Dry eye syndrome of unspecified lacrimal gland: Secondary | ICD-10-CM | POA: Diagnosis not present

## 2013-08-22 DIAGNOSIS — H401290 Low-tension glaucoma, unspecified eye, stage unspecified: Secondary | ICD-10-CM | POA: Diagnosis not present

## 2014-01-05 DIAGNOSIS — H2513 Age-related nuclear cataract, bilateral: Secondary | ICD-10-CM | POA: Diagnosis not present

## 2014-01-05 DIAGNOSIS — H401231 Low-tension glaucoma, bilateral, mild stage: Secondary | ICD-10-CM | POA: Diagnosis not present

## 2014-01-15 DIAGNOSIS — M25562 Pain in left knee: Secondary | ICD-10-CM | POA: Diagnosis not present

## 2014-02-16 DIAGNOSIS — E780 Pure hypercholesterolemia: Secondary | ICD-10-CM | POA: Diagnosis not present

## 2014-02-16 DIAGNOSIS — Z8673 Personal history of transient ischemic attack (TIA), and cerebral infarction without residual deficits: Secondary | ICD-10-CM | POA: Diagnosis not present

## 2014-02-16 DIAGNOSIS — Z23 Encounter for immunization: Secondary | ICD-10-CM | POA: Diagnosis not present

## 2014-02-16 DIAGNOSIS — Z79899 Other long term (current) drug therapy: Secondary | ICD-10-CM | POA: Diagnosis not present

## 2014-02-16 DIAGNOSIS — Z0001 Encounter for general adult medical examination with abnormal findings: Secondary | ICD-10-CM | POA: Diagnosis not present

## 2014-02-16 DIAGNOSIS — Z125 Encounter for screening for malignant neoplasm of prostate: Secondary | ICD-10-CM | POA: Diagnosis not present

## 2014-02-16 DIAGNOSIS — L570 Actinic keratosis: Secondary | ICD-10-CM | POA: Diagnosis not present

## 2014-05-08 DIAGNOSIS — H401231 Low-tension glaucoma, bilateral, mild stage: Secondary | ICD-10-CM | POA: Diagnosis not present

## 2014-05-08 DIAGNOSIS — H2513 Age-related nuclear cataract, bilateral: Secondary | ICD-10-CM | POA: Diagnosis not present

## 2014-08-11 ENCOUNTER — Encounter: Payer: Self-pay | Admitting: Family

## 2014-08-14 ENCOUNTER — Ambulatory Visit (HOSPITAL_COMMUNITY)
Admission: RE | Admit: 2014-08-14 | Discharge: 2014-08-14 | Disposition: A | Discharge status: Home / Self Care | Payer: Medicare Other | Source: Ambulatory Visit | Attending: Family | Admitting: Family

## 2014-08-14 ENCOUNTER — Ambulatory Visit (INDEPENDENT_AMBULATORY_CARE_PROVIDER_SITE_OTHER): Payer: Medicare Other | Admitting: Family

## 2014-08-14 ENCOUNTER — Encounter: Payer: Self-pay | Admitting: Family

## 2014-08-14 VITALS — BP 117/72 | HR 69 | Resp 14 | Ht 71.0 in | Wt 188.0 lb

## 2014-08-14 DIAGNOSIS — I6523 Occlusion and stenosis of bilateral carotid arteries: Secondary | ICD-10-CM | POA: Insufficient documentation

## 2014-08-14 DIAGNOSIS — Z87891 Personal history of nicotine dependence: Secondary | ICD-10-CM

## 2014-08-14 DIAGNOSIS — Z8673 Personal history of transient ischemic attack (TIA), and cerebral infarction without residual deficits: Secondary | ICD-10-CM

## 2014-08-14 NOTE — Patient Instructions (Signed)
Stroke Prevention Some medical conditions and behaviors are associated with an increased chance of having a stroke. You may prevent a stroke by making healthy choices and managing medical conditions. HOW CAN I REDUCE MY RISK OF HAVING A STROKE?   Stay physically active. Get at least 30 minutes of activity on most or all days.  Do not smoke. It may also be helpful to avoid exposure to secondhand smoke.  Limit alcohol use. Moderate alcohol use is considered to be:  No more than 2 drinks per day for men.  No more than 1 drink per day for nonpregnant women.  Eat healthy foods. This involves:  Eating 5 or more servings of fruits and vegetables a day.  Making dietary changes that address high blood pressure (hypertension), high cholesterol, diabetes, or obesity.  Manage your cholesterol levels.  Making food choices that are high in fiber and low in saturated fat, trans fat, and cholesterol may control cholesterol levels.  Take any prescribed medicines to control cholesterol as directed by your health care provider.  Manage your diabetes.  Controlling your carbohydrate and sugar intake is recommended to manage diabetes.  Take any prescribed medicines to control diabetes as directed by your health care provider.  Control your hypertension.  Making food choices that are low in salt (sodium), saturated fat, trans fat, and cholesterol is recommended to manage hypertension.  Take any prescribed medicines to control hypertension as directed by your health care provider.  Maintain a healthy weight.  Reducing calorie intake and making food choices that are low in sodium, saturated fat, trans fat, and cholesterol are recommended to manage weight.  Stop drug abuse.  Avoid taking birth control pills.  Talk to your health care provider about the risks of taking birth control pills if you are over 35 years old, smoke, get migraines, or have ever had a blood clot.  Get evaluated for sleep  disorders (sleep apnea).  Talk to your health care provider about getting a sleep evaluation if you snore a lot or have excessive sleepiness.  Take medicines only as directed by your health care provider.  For some people, aspirin or blood thinners (anticoagulants) are helpful in reducing the risk of forming abnormal blood clots that can lead to stroke. If you have the irregular heart rhythm of atrial fibrillation, you should be on a blood thinner unless there is a good reason you cannot take them.  Understand all your medicine instructions.  Make sure that other conditions (such as anemia or atherosclerosis) are addressed. SEEK IMMEDIATE MEDICAL CARE IF:   You have sudden weakness or numbness of the face, arm, or leg, especially on one side of the body.  Your face or eyelid droops to one side.  You have sudden confusion.  You have trouble speaking (aphasia) or understanding.  You have sudden trouble seeing in one or both eyes.  You have sudden trouble walking.  You have dizziness.  You have a loss of balance or coordination.  You have a sudden, severe headache with no known cause.  You have new chest pain or an irregular heartbeat. Any of these symptoms may represent a serious problem that is an emergency. Do not wait to see if the symptoms will go away. Get medical help at once. Call your local emergency services (911 in [place] Do not drive yourself to the hospital. Document Released: 04/06/2004 Document Revised: 07/14/2013 Document Reviewed: 08/30/2012 [employer] Patient Information 2015 [employer]. This information is not intended to replace advice given   to you by your health care provider. Make sure you discuss any questions you have with your health care provider.  

## 2014-08-14 NOTE — Progress Notes (Signed)
Established Carotid Patient   History of Present Illness  Robert Mclaughlin is a 69 y.o. male  patient of Dr. Trula Slade with a known history of carotid artery disease.  Patient has not had previous carotid artery intervention.  Patient has Positive history of stroke in 2008 as manifested by inability to read for about 4 days while hospitalized. The patient denies amaurosis fugax or monocular blindness. The patient denies facial drooping. Pt. denies hemiplegia. The patient denies receptive or expressive aphasia. Pt. denies extremity weakness. He has had no further stroke or TIA symptoms.  The patient's previous neurologic deficits are resolved. He has a patent foramen ovale. He was released by his neurologist, Dr. Leonie Man. Pt denies claudication symptoms with walking, denies non healing wounds. Pt denies tingling, pain, or weakness in arms or hands.  Pt denies New Medical or Surgical History.  Pt Diabetic: No Pt smoker: former smoker, quit in 1971  Pt meds include: Statin : Yes ASA: No, no longer takes Aggrenox Other anticoagulants/antiplatelets: Plavix  Past Medical History  Diagnosis Date  . Arthritis   . Carotid artery occlusion   . Stroke 2008    Social History History  Substance Use Topics  . Smoking status: Former Smoker    Quit date: 03/13/1969  . Smokeless tobacco: Never Used  . Alcohol Use: 0.0 oz/week    0 Standard drinks or equivalent per week     Comment: 2 drinks per day    Family History Family History  Problem Relation Age of Onset  . Heart disease Mother     Heart Disease before age 54  . Varicose Veins Mother   . Hyperlipidemia Father     Surgical History Past Surgical History  Procedure Laterality Date  . Tonsillectomy      No Known Allergies  Current Outpatient Prescriptions  Medication Sig Dispense Refill  . B Complex Vitamins (B COMPLEX PO) Take by mouth daily.    . clopidogrel (PLAVIX) 75 MG tablet daily.    [name] Kitchen  dipyridamole-aspirin (AGGRENOX) 25-200 MG per 12 hr capsule Take 1 capsule by mouth 2 (two) times daily.    . Multiple Vitamin (MULTIVITAMIN) tablet Take 1 tablet by mouth daily.    . simvastatin (ZOCOR) 20 MG tablet Take 20 mg by mouth every evening.     No current facility-administered medications for this visit.    Review of Systems : See HPI for pertinent positives and negatives.  Physical Examination  Filed Vitals:   08/14/14 1501 08/14/14 1503  BP: 118/63 117/72  Pulse: 75 69  Resp: 14   Height: 5\' 11"  (1.803 m)   Weight: 188 lb (85.276 kg)    Body mass index is 26.23 kg/(m^2).  General: WDWN male in NAD GAIT: normal Eyes: PERRLA Pulmonary: Non-labored, CTAB, Negative Rales, Negative rhonchi, & Negative wheezing.  Cardiac: regular Rhythm, no detected murmur.  VASCULAR EXAM Carotid Bruits Left Right   Negative Negative   Radial pulses are 2+ palpable and equal. Pedal pulses: Bilateral DP and PT pulses are palpable      Gastrointestinal: soft, nontender, BS WNL, no r/g,no palpable masses.  Musculoskeletal: Negative muscle atrophy/wasting. M/S 5/5 throughout, Extremities without ischemic changes.  Neurologic: A&O X 3; Appropriate Affect, Speech is normal CN 2-12 intact, Pain and light touch intact in extremities, Motor exam as listed above.        Non-Invasive Vascular Imaging CAROTID DUPLEX 08/14/2014   CEREBROVASCULAR DUPLEX EVALUATION    INDICATION: Carotid artery disease     PREVIOUS  INTERVENTION(S):     DUPLEX EXAM:     RIGHT  LEFT  Peak Systolic Velocities (cm/s) End Diastolic Velocities (cm/s) Plaque LOCATION Peak Systolic Velocities (cm/s) End Diastolic Velocities (cm/s) Plaque  127 26  CCA PROXIMAL 121 24   106 23  CCA MID 88 19   72 13 HT CCA DISTAL 72 15   78 13  ECA 79 12   42 11  ICA PROXIMAL 68 20 HT   51 17  ICA MID 108 31   83 29  ICA DISTAL 95 28     0.78 ICA / CCA Ratio (PSV) 1.22  Antegrade  Vertebral Flow Antegrade   259 Brachial Systolic Pressure (mmHg) 563  Multiphasic (Subclavian artery) Brachial Artery Waveforms Multiphasic (Subclavian artery)    Plaque Morphology:  HM = Homogeneous, HT = Heterogeneous, CP = Calcific Plaque, SP = Smooth Plaque, IP = Irregular Plaque  ADDITIONAL FINDINGS:     IMPRESSION: Bilateral internal carotid artery velocities suggest a 1-49% stenosis.     Compared to the previous exam:  No significant change in comparison to the last exam on 08/08/2013.      Assessment: Robert Mclaughlin is a 69 y.o. male who has had no stroke or TIA since 2008 as manifested by inability to read for about 4 days while hospitalized. Today's carotid Duplex suggests 1-49% stenosis bilateral ICA stenosis. No significant change in comparison to the last exam on 08/08/2013.    Plan: Follow-up in 1 year with Carotid Duplex.      I discussed in depth with the patient the nature of atherosclerosis, and emphasized the importance of maximal medical management including strict control of blood pressure, blood glucose, and lipid levels, obtaining regular exercise, and continued cessation of smoking.  The patient is aware that without maximal medical management the underlying atherosclerotic disease process will progress, limiting the benefit of any interventions. The patient was given information about stroke prevention and what symptoms should prompt the patient to seek immediate medical care. Thank you for allowing [place] to participate in this patient's care.  Clemon Chambers, RN, MSN, FNP-C [facility] [place] Office: [phone]  Clinic Physician: Oneida Alar on call  08/14/2014 3:18 PM

## 2014-08-17 NOTE — Addendum Note (Signed)
Addended by: Dorthula Rue L on: 08/17/2014 04:36 PM   Modules accepted: Orders

## 2014-09-07 ENCOUNTER — Other Ambulatory Visit: Payer: Self-pay

## 2014-09-16 DIAGNOSIS — Z09 Encounter for follow-up examination after completed treatment for conditions other than malignant neoplasm: Secondary | ICD-10-CM | POA: Diagnosis not present

## 2014-09-16 DIAGNOSIS — Z86010 Personal history of colonic polyps: Secondary | ICD-10-CM | POA: Diagnosis not present

## 2014-10-02 DIAGNOSIS — Z1283 Encounter for screening for malignant neoplasm of skin: Secondary | ICD-10-CM | POA: Diagnosis not present

## 2014-10-02 DIAGNOSIS — L309 Dermatitis, unspecified: Secondary | ICD-10-CM | POA: Diagnosis not present

## 2014-10-09 DIAGNOSIS — R972 Elevated prostate specific antigen [PSA]: Secondary | ICD-10-CM | POA: Diagnosis not present

## 2014-10-23 DIAGNOSIS — H2513 Age-related nuclear cataract, bilateral: Secondary | ICD-10-CM | POA: Diagnosis not present

## 2014-10-23 DIAGNOSIS — H4011X1 Primary open-angle glaucoma, mild stage: Secondary | ICD-10-CM | POA: Diagnosis not present

## 2014-11-25 DIAGNOSIS — M25522 Pain in left elbow: Secondary | ICD-10-CM | POA: Diagnosis not present

## 2014-11-25 DIAGNOSIS — M25562 Pain in left knee: Secondary | ICD-10-CM | POA: Diagnosis not present

## 2014-12-14 DIAGNOSIS — Z23 Encounter for immunization: Secondary | ICD-10-CM | POA: Diagnosis not present

## 2015-01-12 DIAGNOSIS — H43812 Vitreous degeneration, left eye: Secondary | ICD-10-CM | POA: Diagnosis not present

## 2015-01-29 DIAGNOSIS — L02522 Furuncle left hand: Secondary | ICD-10-CM | POA: Diagnosis not present

## 2015-01-29 DIAGNOSIS — B9689 Other specified bacterial agents as the cause of diseases classified elsewhere: Secondary | ICD-10-CM | POA: Diagnosis not present

## 2015-01-29 DIAGNOSIS — L259 Unspecified contact dermatitis, unspecified cause: Secondary | ICD-10-CM | POA: Diagnosis not present

## 2015-03-14 HISTORY — PX: MASS EXCISION: SHX2000

## 2015-04-29 DIAGNOSIS — Z79899 Other long term (current) drug therapy: Secondary | ICD-10-CM | POA: Diagnosis not present

## 2015-04-29 DIAGNOSIS — I679 Cerebrovascular disease, unspecified: Secondary | ICD-10-CM | POA: Diagnosis not present

## 2015-04-29 DIAGNOSIS — R972 Elevated prostate specific antigen [PSA]: Secondary | ICD-10-CM | POA: Diagnosis not present

## 2015-04-29 DIAGNOSIS — E7800 Pure hypercholesterolemia, unspecified: Secondary | ICD-10-CM | POA: Diagnosis not present

## 2015-04-30 DIAGNOSIS — H04123 Dry eye syndrome of bilateral lacrimal glands: Secondary | ICD-10-CM | POA: Diagnosis not present

## 2015-04-30 DIAGNOSIS — H401231 Low-tension glaucoma, bilateral, mild stage: Secondary | ICD-10-CM | POA: Diagnosis not present

## 2015-04-30 DIAGNOSIS — H2513 Age-related nuclear cataract, bilateral: Secondary | ICD-10-CM | POA: Diagnosis not present

## 2015-05-05 DIAGNOSIS — M25562 Pain in left knee: Secondary | ICD-10-CM | POA: Diagnosis not present

## 2015-07-23 DIAGNOSIS — L570 Actinic keratosis: Secondary | ICD-10-CM | POA: Diagnosis not present

## 2015-07-23 DIAGNOSIS — X32XXXA Exposure to sunlight, initial encounter: Secondary | ICD-10-CM | POA: Diagnosis not present

## 2015-08-16 ENCOUNTER — Ambulatory Visit: Payer: Medicare Other | Admitting: Family

## 2015-08-16 ENCOUNTER — Encounter (HOSPITAL_COMMUNITY): Payer: Medicare Other

## 2015-09-15 DIAGNOSIS — M1712 Unilateral primary osteoarthritis, left knee: Secondary | ICD-10-CM | POA: Diagnosis not present

## 2015-09-22 ENCOUNTER — Ambulatory Visit (HOSPITAL_COMMUNITY)
Admission: RE | Admit: 2015-09-22 | Discharge: 2015-09-22 | Disposition: A | Discharge status: Home / Self Care | Payer: Medicare Other | Source: Ambulatory Visit | Attending: Family | Admitting: Family

## 2015-09-22 ENCOUNTER — Other Ambulatory Visit: Payer: Self-pay | Admitting: Family

## 2015-09-22 DIAGNOSIS — Z8673 Personal history of transient ischemic attack (TIA), and cerebral infarction without residual deficits: Secondary | ICD-10-CM | POA: Diagnosis not present

## 2015-09-22 DIAGNOSIS — I6523 Occlusion and stenosis of bilateral carotid arteries: Secondary | ICD-10-CM

## 2015-09-22 DIAGNOSIS — Z87891 Personal history of nicotine dependence: Secondary | ICD-10-CM

## 2015-09-22 DIAGNOSIS — M1712 Unilateral primary osteoarthritis, left knee: Secondary | ICD-10-CM | POA: Diagnosis not present

## 2015-09-23 ENCOUNTER — Encounter: Payer: Self-pay | Admitting: Family

## 2015-09-27 ENCOUNTER — Ambulatory Visit (INDEPENDENT_AMBULATORY_CARE_PROVIDER_SITE_OTHER): Payer: Medicare Other | Admitting: Family

## 2015-09-27 ENCOUNTER — Encounter: Payer: Self-pay | Admitting: Family

## 2015-09-27 VITALS — BP 119/81 | HR 71 | Temp 97.2°F | Resp 20 | Ht 71.0 in | Wt 186.6 lb

## 2015-09-27 DIAGNOSIS — Z8673 Personal history of transient ischemic attack (TIA), and cerebral infarction without residual deficits: Secondary | ICD-10-CM | POA: Diagnosis not present

## 2015-09-27 DIAGNOSIS — I6523 Occlusion and stenosis of bilateral carotid arteries: Secondary | ICD-10-CM

## 2015-09-27 DIAGNOSIS — Q211 Atrial septal defect: Secondary | ICD-10-CM | POA: Diagnosis not present

## 2015-09-27 DIAGNOSIS — Z87891 Personal history of nicotine dependence: Secondary | ICD-10-CM | POA: Diagnosis not present

## 2015-09-27 DIAGNOSIS — Q2112 Patent foramen ovale: Secondary | ICD-10-CM

## 2015-09-27 NOTE — Patient Instructions (Signed)
Chief Complaint: Follow up Extracranial Carotid Artery Stenosis   History of Present Illness  Robert Mclaughlin is a 70 y.o. male patient of Dr. Trula Slade with a known history of carotid artery disease.  Patient has not had previous carotid artery intervention.  Patient has Positive history of stroke in 2008 as manifested by inability to read for about 4 days while hospitalized. The patient denies amaurosis fugax or monocular blindness. The patient denies facial drooping. Pt. denies hemiplegia. The patient denies receptive or expressive aphasia. Pt. denies extremity weakness. He has had no further stroke or TIA symptoms.  Pt denies chest pain, denies dyspnea, denies feeling light headed.  He is receiving injections in his left knee for OA pain.  The patient's previous neurologic deficits are resolved. He has a patent foramen ovale; pt states he was told that his stroke was likely caused by this. He was released by his neurologist, Dr. Leonie Man. Pt denies claudication symptoms with walking, denies non healing wounds. Pt denies tingling, pain, or weakness in arms or hands.  Pt denies New Medical or Surgical History.  Pt Diabetic: No Pt smoker: former smoker, quit in 1971  Pt meds include: Statin : Yes ASA: No, no longer takes Aggrenox Other anticoagulants/antiplatelets: Plavix   Past Medical History  Diagnosis Date  . Arthritis   . Carotid artery occlusion   . Stroke [facility]) 2008    Social History Social History  Substance Use Topics  . Smoking status: Former Smoker    Quit date: 03/13/1969  . Smokeless tobacco: Never Used  . Alcohol Use: 0.0 oz/week    0 Standard drinks or equivalent per week     Comment: 2 drinks per day    Family History Family History  Problem Relation Age of Onset  . Heart disease Mother     Heart Disease before age 100  . Varicose Veins Mother   . Hyperlipidemia Father     Surgical History Past Surgical History  Procedure  Laterality Date  . Tonsillectomy      No Known Allergies  Current Outpatient Prescriptions  Medication Sig Dispense Refill  . B Complex Vitamins (B COMPLEX PO) Take by mouth daily.    . clopidogrel (PLAVIX) 75 MG tablet daily.    . Multiple Vitamin (MULTIVITAMIN) tablet Take 1 tablet by mouth daily.    . simvastatin (ZOCOR) 20 MG tablet Take 20 mg by mouth every evening.     No current facility-administered medications for this visit.    Review of Systems : See HPI for pertinent positives and negatives.  Physical Examination  Filed Vitals:   09/27/15 1048  BP: 119/81  Pulse: 71  Temp: 97.2 F (36.2 C)  TempSrc: Oral  Resp: 20  Height: 5\' 11"  (1.803 m)  Weight: 186 lb 9.6 oz (84.641 kg)  SpO2: 97%   Body mass index is 26.04 kg/(m^2).  General: WDWN fit appearing male in NAD GAIT: normal Eyes: PERRLA Pulmonary: Respirations are non-labored, CTAB  Cardiac: regular rhythm with regular premature contractions with compensatory pauses, no detected murmur.  VASCULAR EXAM Carotid Bruits Left Right   Negative Negative   Radial pulses are 2+ palpable and equal. Pedal pulses: Bilateral DP and PT pulses are palpable     Gastrointestinal: soft, nontender, BS WNL, no r/g,no palpable masses.  Musculoskeletal: No muscle atrophy/wasting. M/S 5/5 throughout, Extremities without ischemic changes.  Neurologic: A&O X 3; Appropriate Affect, Speech is normal CN 2-12 intact, Pain and light touch intact in extremities, Motor exam  as listed above.               Non-Invasive Vascular Imaging CAROTID DUPLEX 09/22/15   CEREBROVASCULAR DUPLEX EVALUATION    INDICATION: Carotid disease    PREVIOUS INTERVENTION(S):     DUPLEX EXAM:     RIGHT  LEFT  Peak Systolic Velocities (cm/s) End Diastolic Velocities (cm/s) Plaque LOCATION Peak Systolic  Velocities (cm/s) End Diastolic Velocities (cm/s) Plaque  129 18  CCA PROXIMAL 125 23   123 24  CCA MID 116 27   84 20  CCA DISTAL 82 21   61 11 HT ECA 73 13   45 13  ICA PROXIMAL 82 24 HT  58 20  ICA MID 71 20   68 23  ICA DISTAL 85 29     Not Calculated ICA / CCA Ratio (PSV) Not Calculated  Antegrade Vertebral Flow Antegrade   Brachial Systolic Pressure (mmHg)   Multiphasic (subclavian artery) Brachial Artery Waveforms Multiphasic (subclavian artery)    Plaque Morphology:  HM = Homogeneous, HT = Heterogeneous, CP = Calcific Plaque, SP = Smooth Plaque, IP = Irregular Plaque     ADDITIONAL FINDINGS: No significant stenosis of the bilateral external or common carotid arteries.    IMPRESSION: Doppler velocities suggest no hemodynamically significant stenoses of the bilateral proximal internal carotid arteries.    Compared to the previous exam:  No significant change noted when compared to the previous exam on 08/14/14.        Assessment: Robert Mclaughlin is a 70 y.o. male who has had no stroke or TIA since 2008 as manifested by inability to read for about 4 days while hospitalized. He has not had any subsequent stroke or TIA.  Pt states he was told that his stroke was most likely caused by his patent foramen ovale.  Today's carotid duplex suggests no hemodynamically significant stenoses of the bilateral proximal internal carotid arteries, no significant stenosis of the bilateral external or common carotid arteries. No significant change noted when compared to the previous exam on 08/14/14.   He denies chest pain, denies dyspnea, denies feeling light headed. His cardiac rhythm is regular   Plan: Follow-up in 2 years with Carotid Duplex scan.   I discussed in depth with the patient the nature of atherosclerosis, and emphasized the importance of maximal medical management including strict control of blood pressure, blood glucose, and lipid levels, obtaining regular exercise, and  continued cessation of smoking.  The patient is aware that without maximal medical management the underlying atherosclerotic disease process will progress, limiting the benefit of any interventions. The patient was given information about stroke prevention and what symptoms should prompt the patient to seek immediate medical care. Thank you for allowing [place] to participate in this patient's care.  Clemon Chambers, RN, MSN, FNP-C [facility] Office: [facility] Physician: Trula Slade  09/27/2015,  10:55 AM

## 2015-09-27 NOTE — Progress Notes (Signed)
Chief Complaint: Follow up Extracranial Carotid Artery Stenosis   History of Present Illness  Robert Mclaughlin is a 70 y.o. male patient of Dr. Trula Slade with a known history of mild extracranial carotid artery disease.  Patient has not had previous carotid artery intervention.  He has a history of stroke in 2008 as manifested by inability to read for about 4 days while hospitalized. He denies a history of amaurosis fugax or monocular blindness, unilateral facial drooping, hemiplegia, or receptive or expressive aphasia.  He has had no further stroke or TIA symptoms.  The patient's previous neurologic deficits are resolved. He has a patent foramen ovale. He was released by his neurologist, Dr. Leonie Man. Pt denies claudication symptoms with walking, denies non healing wounds. Pt denies tingling, pain, or weakness in arms or hands.  Pt denies New Medical or Surgical History.  Pt Diabetic: No Pt smoker: former smoker, quit in 1971  Pt meds include: Statin : Yes ASA: No, no longer takes Aggrenox Other anticoagulants/antiplatelets: Plavix   Past Medical History  Diagnosis Date  . Arthritis   . Carotid artery occlusion   . Stroke [facility]) 2008    Social History Social History  Substance Use Topics  . Smoking status: Former Smoker    Quit date: 03/13/1969  . Smokeless tobacco: Never Used  . Alcohol Use: 0.0 oz/week    0 Standard drinks or equivalent per week     Comment: 2 drinks per day    Family History Family History  Problem Relation Age of Onset  . Heart disease Mother     Heart Disease before age 32  . Varicose Veins Mother   . Hyperlipidemia Father     Surgical History Past Surgical History  Procedure Laterality Date  . Tonsillectomy      No Known Allergies  Current Outpatient Prescriptions  Medication Sig Dispense Refill  . B Complex Vitamins (B COMPLEX PO) Take by mouth daily.    . clopidogrel (PLAVIX) 75 MG tablet daily.    . Multiple Vitamin  (MULTIVITAMIN) tablet Take 1 tablet by mouth daily.    . simvastatin (ZOCOR) 20 MG tablet Take 20 mg by mouth every evening.     No current facility-administered medications for this visit.    Review of Systems : See HPI for pertinent positives and negatives.  Physical Examination  Filed Vitals:   09/27/15 1048  BP: 119/81  Pulse: 71  Temp: 97.2 F (36.2 C)  TempSrc: Oral  Resp: 20  Height: 5\' 11"  (1.803 m)  Weight: 186 lb 9.6 oz (84.641 kg)  SpO2: 97%   Body mass index is 26.04 kg/(m^2).  General: WDWN male in NAD GAIT: normal Eyes: PERRLA Pulmonary: Respirations are non-labored, CTAB Cardiac: regular rhythm, no detected murmur  VASCULAR EXAM Carotid Bruits Left Right   Negative Negative   Radial pulses are 2+ palpable and equal. Pedal pulses: Bilateral DP and PT pulses are palpable      Gastrointestinal: soft, nontender, BS WNL, no r/g,no palpable masses.  Musculoskeletal: No muscle atrophy/wasting. M/S 5/5 throughout, Extremities without ischemic changes.  Neurologic: A&O X 3; Appropriate Affect, Speech is normal, CN 2-12 intact, Pain and light touch intact in extremities, Motor exam as listed above.               Non-Invasive Vascular Imaging CAROTID DUPLEX (09/22/15)   CEREBROVASCULAR DUPLEX EVALUATION    INDICATION: Carotid disease    PREVIOUS INTERVENTION(S):     DUPLEX EXAM:     RIGHT  LEFT  Peak Systolic Velocities (cm/s) End Diastolic Velocities (cm/s) Plaque LOCATION Peak Systolic Velocities (cm/s) End Diastolic Velocities (cm/s) Plaque  129 18  CCA PROXIMAL 125 23   123 24  CCA MID 116 27   84 20  CCA DISTAL 82 21   61 11 HT ECA 73 13   45 13  ICA PROXIMAL 82 24 HT  58 20  ICA MID 71 20   68 23  ICA DISTAL 85 29     Not Calculated ICA / CCA Ratio (PSV) Not Calculated  Antegrade Vertebral  Flow Antegrade   Brachial Systolic Pressure (mmHg)   Multiphasic (subclavian artery) Brachial Artery Waveforms Multiphasic (subclavian artery)    Plaque Morphology:  HM = Homogeneous, HT = Heterogeneous, CP = Calcific Plaque, SP = Smooth Plaque, IP = Irregular Plaque     ADDITIONAL FINDINGS: No significant stenosis of the bilateral external or common carotid arteries.    IMPRESSION: Doppler velocities suggest no hemodynamically significant stenoses of the bilateral proximal internal carotid arteries.    Compared to the previous exam:  No significant change noted when compared to the previous exam on 08/14/14.        Assessment: Robert Mclaughlin is a 70 y.o. male who has had no stroke or TIA since 2008; this manifested by inability to read for about 4 days while hospitalized. Pt states he was told that the genesis of his stroke might have been his patent foramen ovale. 09/22/15 carotid Duplex suggests no hemodynamically significant stenoses of the bilateral proximal internal carotid arteries.  No significant change noted when compared to the previous exam on 08/14/14.    He takes a statin and Plavix He does not have DM and quit smoking in 1971.  Plan: Follow-up in 2 years with Carotid Duplex scan.   I discussed in depth with the patient the nature of atherosclerosis, and emphasized the importance of maximal medical management including strict control of blood pressure, blood glucose, and lipid levels, obtaining regular exercise, and continued cessation of smoking.  The patient is aware that without maximal medical management the underlying atherosclerotic disease process will progress, limiting the benefit of any interventions. The patient was given information about stroke prevention and what symptoms should prompt the patient to seek immediate medical care. Thank you for allowing [place] to participate in this patient's care.  Clemon Chambers, RN, MSN, FNP-C [facility]  [place] Office: [facility] Physician: Trula Slade  09/27/2015 10:53 AM

## 2015-09-29 ENCOUNTER — Encounter: Payer: Self-pay | Admitting: Family

## 2015-09-29 DIAGNOSIS — M1712 Unilateral primary osteoarthritis, left knee: Secondary | ICD-10-CM | POA: Diagnosis not present

## 2015-09-29 DIAGNOSIS — M25562 Pain in left knee: Secondary | ICD-10-CM | POA: Diagnosis not present

## 2015-10-29 DIAGNOSIS — H2513 Age-related nuclear cataract, bilateral: Secondary | ICD-10-CM | POA: Diagnosis not present

## 2015-10-29 DIAGNOSIS — H401231 Low-tension glaucoma, bilateral, mild stage: Secondary | ICD-10-CM | POA: Diagnosis not present

## 2015-10-29 DIAGNOSIS — H04123 Dry eye syndrome of bilateral lacrimal glands: Secondary | ICD-10-CM | POA: Diagnosis not present

## 2015-11-18 ENCOUNTER — Other Ambulatory Visit: Payer: Self-pay

## 2015-12-30 ENCOUNTER — Ambulatory Visit (INDEPENDENT_AMBULATORY_CARE_PROVIDER_SITE_OTHER): Payer: Medicare Other | Admitting: Sports Medicine

## 2015-12-30 DIAGNOSIS — M25562 Pain in left knee: Secondary | ICD-10-CM | POA: Diagnosis not present

## 2016-01-05 ENCOUNTER — Ambulatory Visit (INDEPENDENT_AMBULATORY_CARE_PROVIDER_SITE_OTHER): Payer: Self-pay | Admitting: Orthopedic Surgery

## 2016-01-27 ENCOUNTER — Encounter (INDEPENDENT_AMBULATORY_CARE_PROVIDER_SITE_OTHER): Payer: Self-pay | Admitting: Orthopedic Surgery

## 2016-01-27 ENCOUNTER — Ambulatory Visit (INDEPENDENT_AMBULATORY_CARE_PROVIDER_SITE_OTHER): Payer: Medicare Other | Admitting: Orthopedic Surgery

## 2016-01-27 DIAGNOSIS — I6523 Occlusion and stenosis of bilateral carotid arteries: Secondary | ICD-10-CM

## 2016-01-27 DIAGNOSIS — M1712 Unilateral primary osteoarthritis, left knee: Secondary | ICD-10-CM | POA: Diagnosis not present

## 2016-01-27 NOTE — Progress Notes (Signed)
Office Visit Note   Patient: Robert Mclaughlin           Date of Birth: [birth year]           MRN: [id number] Visit Date: 01/27/2016 Requested by: Lawerance Cruel, MD [place], [place] [id number] PCP: Melinda Crutch, MD  Subjective: Chief Complaint  Patient presents with  . Left Knee - Pain, Follow-up    HPI Robert Mclaughlin is a 70 year old patient with left knee pain.  Recently went to [place] and had a difficult time ambulating around the country.  Heavy get a cane for trip.  Scanning heart for him to even do nonweightbearing exercises such as biking.  Patient did have a stroke 10 years ago and had a patent foramen ovale.  Has been on Plavix since that time.  He does describe significant difficulty going up and down stairs.  Has a known medial meniscal tear which is been treated nonoperatively.  Had Euflexxa No. 3 in July 2017 and cortisone injection in October 2017.  Symptoms have progressed since then.  He is taking diclofenac as well.              Review of Systems  All systems reviewed are negative as they relate to the chief complaint within the history of present illness.  Patient denies  fevers or chills.  No issues with cognitive function or any stroke-related symptoms Assessment & Plan: Visit Diagnoses:  1. Primary osteoarthritis of left knee     Plan: Impression is left knee pain refractory to nonoperative management.  See if can arthritis is present primarily in the patellofemoral and medial compartments.  Plan is total knee replacement.  He has pretty significant tibial slope on radiographs.  He may be a candidate for press-fit prosthesis based on bone density and quality.  Risks and benefits of knee replacement discussed including not limited to infection or vessel damage knee stiffness the prolonged rehabilitative process required as well as chance for DVT pulmonary embolism stroke.  I would not use transiently make acid in this patient because of his history of stroke.  We  will need to get medical evaluation of perioperative Plavix management for this patient as well.  Patient understands the risks benefits and wishes to proceed.  All questions answered Follow-Up Instructions: No Follow-up on file.   Orders:  No orders of the defined types were placed in this encounter.  No orders of the defined types were placed in this encounter.     Procedures: No procedures performed   Clinical Data: No additional findings.  Objective: Vital Signs: There were no vitals taken for this visit.  Physical Exam  Constitutional: He appears well-developed.  HENT:  Head: Normocephalic.  Eyes: EOM are normal.  Neck: Normal range of motion.  Cardiovascular: Normal rate.   Pulmonary/Chest: Effort normal.  Neurological: He is alert.  Skin: Skin is warm.  Psychiatric: He has a normal mood and affect.    Ortho Exam examination of the left knee demonstrates about a 10 flexion contracture palpable pedal pulses stable collateral crucial ligaments medial and patellofemoral joint line tenderness range of motion is easily to about 115 of flexion there is no groin pain with internal/external rotation leg no other masses lymph adenopathy or skin changes noted in the left knee region.  No effusion today.  Specialty Comments:  No specialty comments available.  Imaging: No results found.   PMFS History: Patient Active Problem List   Diagnosis Date Noted  .  Occlusion and stenosis of carotid artery without mention of cerebral infarction 07/24/2011   Past Medical History:  Diagnosis Date  . Arthritis   . Carotid artery occlusion   . Stroke [facility]) 2008    Family History  Problem Relation Age of Onset  . Heart disease Mother     Heart Disease before age 36  . Varicose Veins Mother   . Hyperlipidemia Father     Past Surgical History:  Procedure Laterality Date  . TONSILLECTOMY     Social History   Occupational History  . Not on file.   Social History Main  Topics  . Smoking status: Former Smoker    Quit date: 03/13/1969  . Smokeless tobacco: Never Used  . Alcohol use 0.0 oz/week     Comment: 2 drinks per day  . Drug use: No  . Sexual activity: Not on file

## 2016-01-28 NOTE — Addendum Note (Signed)
Addended by: Lianne Cure A on: 01/28/2016 12:29 PM   Modules accepted: Orders

## 2016-02-23 ENCOUNTER — Other Ambulatory Visit (INDEPENDENT_AMBULATORY_CARE_PROVIDER_SITE_OTHER): Payer: Self-pay | Admitting: Orthopedic Surgery

## 2016-02-23 DIAGNOSIS — M1712 Unilateral primary osteoarthritis, left knee: Secondary | ICD-10-CM

## 2016-02-25 DIAGNOSIS — D225 Melanocytic nevi of trunk: Secondary | ICD-10-CM | POA: Diagnosis not present

## 2016-02-25 DIAGNOSIS — L570 Actinic keratosis: Secondary | ICD-10-CM | POA: Diagnosis not present

## 2016-02-25 DIAGNOSIS — X32XXXD Exposure to sunlight, subsequent encounter: Secondary | ICD-10-CM | POA: Diagnosis not present

## 2016-03-01 ENCOUNTER — Encounter (HOSPITAL_COMMUNITY): Payer: Self-pay

## 2016-03-01 ENCOUNTER — Ambulatory Visit (HOSPITAL_COMMUNITY)
Admission: RE | Admit: 2016-03-01 | Discharge: 2016-03-01 | Disposition: A | Discharge status: Home / Self Care | Payer: Medicare Other | Source: Ambulatory Visit | Attending: Orthopedic Surgery | Admitting: Orthopedic Surgery

## 2016-03-01 ENCOUNTER — Encounter (HOSPITAL_COMMUNITY)
Admission: RE | Admit: 2016-03-01 | Discharge: 2016-03-01 | Disposition: A | Discharge status: Home / Self Care | Payer: Medicare Other | Source: Ambulatory Visit | Attending: Orthopedic Surgery | Admitting: Orthopedic Surgery

## 2016-03-01 DIAGNOSIS — R918 Other nonspecific abnormal finding of lung field: Secondary | ICD-10-CM | POA: Diagnosis not present

## 2016-03-01 DIAGNOSIS — Z01810 Encounter for preprocedural cardiovascular examination: Secondary | ICD-10-CM | POA: Diagnosis not present

## 2016-03-01 DIAGNOSIS — Z01818 Encounter for other preprocedural examination: Secondary | ICD-10-CM

## 2016-03-01 DIAGNOSIS — Z01812 Encounter for preprocedural laboratory examination: Secondary | ICD-10-CM | POA: Diagnosis not present

## 2016-03-01 HISTORY — DX: Atrial septal defect: Q21.1

## 2016-03-01 HISTORY — DX: Patent foramen ovale: Q21.12

## 2016-03-01 HISTORY — DX: Cardiac arrhythmia, unspecified: I49.9

## 2016-03-01 NOTE — Pre-Procedure Instructions (Addendum)
Robert Mclaughlin  03/01/2016      [id number] - [name] [place] [address] Phone: [phone] Fax: [phone]    Your procedure is scheduled on 03/29/15  Report to [name] Admitting at 1245 A.M.  Call this number if you have problems the morning of surgery:  [phone]   Remember:  Do not eat food or drink liquids after midnight.  Take these medicines the morning of surgery with A SIP OF WATER   NONE  STOP all herbel meds, nsaids (aleve,naproxen,advil,ibuprofen) 7 days prior to surgery starting 03/23/15 including all vitamins ,voltaren  STOP plavix per dr   Robert Mclaughlin not wear jewelry, make-up or nail polish.  Do not wear lotions, powders, or perfumes, or deoderant.  Do not shave 48 hours prior to surgery.  Men may shave face and neck.  Do not bring valuables to the hospital.  [name] is not responsible for any belongings or valuables.  Contacts, dentures or bridgework may not be worn into surgery.  Leave your suitcase in the car.  After surgery it may be brought to your room.  For patients admitted to the hospital, discharge time will be determined by your treatment team.  Patients discharged the day of surgery will not be allowed to drive home.   Special instructions:  Special Instructions: [name] - Preparing for Surgery  Before surgery, you can play an important role.  Because skin is not sterile, your skin needs to be as free of germs as possible.  You can reduce the number of germs on you skin by washing with CHG (chlorahexidine gluconate) soap before surgery.  CHG is an antiseptic cleaner which kills germs and bonds with the skin to continue killing germs even after washing.  Please DO NOT use if you have an allergy to CHG or antibacterial soaps.  If your skin becomes reddened/irritated stop using the CHG and inform your nurse when you arrive at Short Stay.  Do not shave (including legs  and underarms) for at least 48 hours prior to the first CHG shower.  You may shave your face.  Please follow these instructions carefully:   1.  Shower with CHG Soap the night before surgery and the morning of Surgery.  2.  If you choose to wash your hair, wash your hair first as usual with your normal shampoo.  3.  After you shampoo, rinse your hair and body thoroughly to remove the Shampoo.  4.  Use CHG as you would any other liquid soap.  You can apply chg directly  to the skin and wash gently with scrungie or a clean washcloth.  5.  Apply the CHG Soap to your body ONLY FROM THE NECK DOWN.  Do not use on open wounds or open sores.  Avoid contact with your eyes ears, mouth and genitals (private parts).  Wash genitals (private parts)       with your normal soap.  6.  Wash thoroughly, paying special attention to the area where your surgery will be performed.  7.  Thoroughly rinse your body with warm water from the neck down.  8.  DO NOT shower/wash with your normal soap after using and rinsing off the CHG Soap.  9.  Pat yourself dry with a clean towel.            10.  Wear clean pajamas.            11.  Place clean sheets on your bed the night of your first shower and do not sleep with pets.  Day of Surgery  Do not apply any lotions/deodorants the morning of surgery.  Please wear clean clothes to the hospital/surgery center.  Please read over the fact sheets that you were given.

## 2016-03-02 ENCOUNTER — Telehealth (INDEPENDENT_AMBULATORY_CARE_PROVIDER_SITE_OTHER): Payer: Self-pay | Admitting: Orthopedic Surgery

## 2016-03-02 ENCOUNTER — Encounter (HOSPITAL_COMMUNITY): Payer: Self-pay | Admitting: Vascular Surgery

## 2016-03-02 ENCOUNTER — Encounter (HOSPITAL_COMMUNITY): Payer: Self-pay

## 2016-03-02 NOTE — Progress Notes (Addendum)
Anesthesia Chart Review: Patient is a 70 year old male scheduled for left TKA on 03/28/16 by Dr. Marlou Sa. Patient had PAT appointment scheduled 03/01/16, but surgery date was apparently changed. Labs were not done on 03/01/16 due to being out of the 14 day range. Case is posted for general anesthesia.   History includes former smoker (quit '71), arthritis, CVA 04/18/06 s/p TPA (possibley due to PFO; by notes, patient declined closure at that time and was treated with Aggrenox--now on Plavix), dysrhythmia ("irregular heart beat" years ago), tonsillectomy, carotid artery occlusive disease (A999333 LICA stenosis by MRA 04/18/06, but no significant proximal ICA stenosis noted by Duplex 09/22/15).  PCP is Dr. Myriam Jacobson.   Vascular surgeon is Dr. Trula Slade, last visit with Clemon Chambers, NP on 09/27/15. Neurologist was Dr. Leonie Man, but discharged back to PCP follow-up after 10/02/11 visit. His note indicates no definite CVA source identified although patient with large PFO. Continue Aggrenox for CVA prevention recommended.   Meds include Plavix, Zocor. By PAT RN notes, patient was instructed to hold Plavix for 5 days prior to surgery.   03/01/16 EKG: SR with occasional PVC.  04/25/06 Transcranial Doppler Bubble Study ([facility]): Impression: TCD bubble study is positive for the presence of a medium to large intracardiac right to left shunt.   04/18/06 Echo (transthoracic): SUMMARY - Overall left ventricular systolic function was normal. Left    ventricular ejection fraction was estimated , range being 60    % to 65 %. There was no diagnostic evidence of left    ventricular regional wall motion abnormalities. Left    ventricular wall thickness was mildly increased. There was an    increased relative contribution of atrial contraction to left    ventricular filling. - There was mild aortic valvular regurgitation. The aortic    regurgitation jet was eccentric. - The mitral regurgitation  jet was eccentric. - Right ventricular size was at the upper limits of normal. (By discharge summary, patient also had a TEE that showed "EF of 60% with mildly      __________  in the intraatrial septum with prominent __________ .[name] KitchenMarland KitchenTransesophageal  echocardiogram showed a very large patent foramen ovale with positive  __________  right to left shunt at rest." TEE report requested from [facility] HIM department.)   09/22/15 Carotid U/S: Doppler velocities suggest no hemodynamically significant stenosis of bilateral proximal ICA's.  03/01/16 CXR: IMPRESSION: No radiographic evidence of acute cardiopulmonary disease.  He will need labs prior to surgery.   Reviewed above with anesthesiologist Dr. Orene Desanctis. Patient with large PFO with right to left shunt in 2008. He was seen by neurology, but no clear documentation that he saw cardiology to discuss. Recommend preoperative cardiology evaluation regarding PFO. Dr. Orene Desanctis thought he may need a follow-up echo, but would defer to cardiology. If continued medical therapy is planned, then patient should understand that he is at further increased risk for CVA with orthopedic surgery. Plavix instructions may also need clarifying. Currently told to hold Plavix for 5 days and case posted for GA. For spinal anesthesia to be an option would need to hold for 7 days, but may want to minimize time off of Plavix with PFO. Will see if Dr. Randel Pigg office can clarify once patient is seen by cardiology (message left with Maudie Mercury).  George Hugh [facility] Short Stay Center/Anesthesiology Phone [phone] 03/02/2016 5:17 PM  Addendum: Since 03/02/16, patient has seen cardiologist Dr. Johnsie Cancel with echo (see below) and was subsequently referred to cardiologist Dr. Legrand Como  Cooper on 03/22/16 to discuss consideration of transcatheter PFO closure. In his assessment and plan, he writes, "PFO with atrial septal aneurysm: The patient had a stroke 10 years ago that occurred after  working out at the gym. He was diagnosed at the time with a PFO and atrial septal aneurysm with moderate shunting at rest based on TEE study. He has been treated with antiplatelet therapy ever since that time and has had no recurrent events. I have reviewed available clinical trial data with the patient. He understands that he does fall outside of the typical inclusion criteria of PFO/stroke clinical trials since his stroke was so long ago. He does have some high risk anatomic features based on review of his TEE. We discussed potential risks of transcatheter PFO closure and he understands that the risks of serious complications like stroke, MI, major bleeding, cardiac perforation, device embolization, and need for emergency surgery all occur at a very low frequency less than 1%. Since he has done so well on antiplatelet therapy and has never had any bleeding problems, I have recommended at least for the present time the continue with this approach. I am going to send him for neurologic evaluation to get further input into whether PFO closure should be considered. I did inform him of the importance of aggressive DVT prevention with his upcoming knee surgery and he understands this. I will communicate that to Dr. Marlou Sa." (Patient is planning to see Neurologist Dr. Erlinda Hong following recovery from surgery.)  03/16/16 Echo with Bubble Study: Study Conclusions - Left ventricle: The cavity size was normal. There was mild   concentric hypertrophy. Systolic function was normal. The   estimated ejection fraction was in the range of 60% to 65%. Wall   motion was normal; there were no regional wall motion   abnormalities. Doppler parameters are consistent with abnormal   left ventricular relaxation (grade 1 diastolic dysfunction).   Doppler parameters are consistent with indetermiante ventricular   filling pressure. - Aortic valve: Transvalvular velocity was within the normal range.   There was no stenosis. There was mild  regurgitation.   Regurgitation pressure half-time: 590 ms. - Mitral valve: Transvalvular velocity was within the normal range.   There was no evidence for stenosis. There was mild regurgitation   directed posteriorly. - Right ventricle: The cavity size was normal. Wall thickness was   normal. Systolic function was normal. - Atrial septum: There was a patent foramen ovale as noted by   saline microcavitation study. - Tricuspid valve: There was mild regurgitation. - Pulmonary arteries: Systolic pressure was within the normal   range. PA peak pressure: 29 mm Hg (S).  In patient communication by Hoover Browns, NT, "Per medical clearance form, pt was seen by Dr. Johnsie Cancel for his PFO, and was marked as low risk for his surgical procedure. And stated he can stop plavix 7 days prior to surgery instead of 5." Patient was instructed about this on 03/20/16.   I have updated anesthesiologist Dr. Linna Caprice. He has discussed further with Dr. Burt Knack. Reportedly, Dr. Burt Knack to talk with neurology and patient again to make final decision regarding whether or not to proceed with knee surgery. If still no plans or recommendations to repair PFO in the future then preferable anesthesia plan would be to have patient hold Plavix for at least seven days and do procedure under spinal anesthesia. Once I find out for sure that procedure will remain as scheduled then I will see if our PAT scheduler can get  patient back in to PAT to get labs (which would be preferable to the day of surgery).   George Hugh [facility] Short Stay Center/Anesthesiology Phone [phone] 03/24/2016 2:47 PM

## 2016-03-02 NOTE — Telephone Encounter (Signed)
Ebony Hail at [facility] called this afternoon about Mr. Rowden. Stated that Dr. Orene Desanctis is asking for cardiology visit to re-evaluate PFO. Claims pt hasn't been evaluated for this in 10 years. Stated this could put him in high risk for stroke during surgery. Surgery is scheduled for 03/28/16.

## 2016-03-07 NOTE — Progress Notes (Signed)
Cardiology Office Note   Date:  03/15/2016   ID:  Robert Mclaughlin, DOB 1945/08/09, MRN [id number]  PCP:  Melinda Crutch, MD  Cardiologist:   Jenkins Rouge, MD   No chief complaint on file.     History of Present Illness: Robert Mclaughlin is a 70 y.o. male who presents for preop evaluation needs left TKR with Dr Marlou Sa from orthopedics  Former smoker quit in 1971 CVA 2008 ? From PFO on Plavix. History of chronic PVC's .    Carotid: 09/22/15 plaque no stenosis  Echo: 2008 EF 60-65% mild eccentric AR large PFO by TEE with positive bubble  Anesthesia concerned about increased stroke risk PFO and ? Holding plavix for 5 days for regular anesthesia But 7 for spinal   He is active at gym with no chest pain, dyspnea or palpitations has not had any recurrent TIA's Has not had general anesthesia or surgery since CVA  He grew up in [place] and went to [employer] likes to travel last visit was [place]   Past Medical History:  Diagnosis Date  . Arthritis   . Carotid artery occlusion   . Dysrhythmia    iiregular heart beat yrs ago  . PFO (patent foramen ovale)    diagnosed 04/2006  . Stroke [facility]) 2008    Past Surgical History:  Procedure Laterality Date  . TONSILLECTOMY       Current Outpatient Prescriptions  Medication Sig Dispense Refill  . ARTIFICIAL TEAR OP Apply 1 drop to eye daily.    . B Complex Vitamins (B COMPLEX PO) Take 1 tablet by mouth daily.     . clopidogrel (PLAVIX) 75 MG tablet Take 75 mg by mouth daily.     . diclofenac sodium (VOLTAREN) 1 % GEL Apply 1 application topically 4 (four) times daily as needed (muscle pain).     . Multiple Vitamin (MULTIVITAMIN) tablet Take 1 tablet by mouth daily.    . simvastatin (ZOCOR) 20 MG tablet Take 20 mg by mouth every evening.     No current facility-administered medications for this visit.     Allergies:   Latanoprost and Lumigan [bimatoprost]    Social History:  The patient  reports that he  quit smoking about 47 years ago. He has a 6.00 pack-year smoking history. He has never used smokeless tobacco. He reports that he drinks about 8.4 oz of alcohol per week . He reports that he does not use drugs.   Family History:  The patient's family history includes Heart disease in his mother; Hyperlipidemia in his father; Varicose Veins in his mother.    ROS:  Please see the history of present illness.   Otherwise, review of systems are positive for none.   All other systems are reviewed and negative.    PHYSICAL EXAM: VS:  BP 126/82   Pulse 80   Ht 5\' 11"  (1.803 m)   Wt 190 lb (86.2 kg)   BMI 26.50 kg/m  , BMI Body mass index is 26.5 kg/m. Affect appropriate Healthy:  appears stated age 27: normal Neck supple with no adenopathy JVP normal no bruits no thyromegaly Lungs clear with no wheezing and good diaphragmatic motion Heart:  S1/S2 no murmur, no rub, gallop or click PMI normal Abdomen: benighn, BS positve, no tenderness, no AAA no bruit.  No HSM or HJR Distal pulses intact with no bruits No edema Neuro non-focal Skin warm and dry No muscular weakness    EKG:  SR PVC no change since 2008 S wave with inverted T wave in lead 3    Recent Labs: No results found for requested labs within last 8760 hours.    Lipid Panel No results found for: CHOL, TRIG, HDL, CHOLHDL, VLDL, LDLCALC, LDLDIRECT    Wt Readings from Last 3 Encounters:  03/15/16 190 lb (86.2 kg)  03/01/16 188 lb (85.3 kg)  09/27/15 186 lb 9.6 oz (84.6 kg)      Other studies Reviewed: Additional studies/ records that were reviewed today include: Noted Dr Alphonzo Severance Ortho Notes 2008 and echo from Dr Leonie Man .    ASSESSMENT AND PLAN:  1. Preop clear to have surgery and hold plavix for up to 7 days  2. PFO will update echo and have him f/u with Dr Burt Knack Literature Has changed and favors closure of large PFO's and he would benefit from consult with Dr Burt Knack 3. CVA non recurrent resume plavix  post op  4. AR no murmur on exam update echo    Current medicines are reviewed at length with the patient today.  The patient does not have concerns regarding medicines.  The following changes have been made:  no change  Labs/ tests ordered today include: Echo  No orders of the defined types were placed in this encounter.    Disposition:   FU with Dr Burt Knack      Signed, Jenkins Rouge, MD  03/15/2016 9:19 AM    [address] [place], [place],   [address] Phone: [phone]; Fax: [phone]

## 2016-03-15 ENCOUNTER — Encounter: Payer: Self-pay | Admitting: Cardiovascular Disease

## 2016-03-15 ENCOUNTER — Ambulatory Visit (INDEPENDENT_AMBULATORY_CARE_PROVIDER_SITE_OTHER): Payer: Medicare Other | Admitting: Cardiovascular Disease

## 2016-03-15 ENCOUNTER — Telehealth (INDEPENDENT_AMBULATORY_CARE_PROVIDER_SITE_OTHER): Payer: Self-pay | Admitting: Orthopedic Surgery

## 2016-03-15 VITALS — BP 126/82 | HR 80 | Ht 71.0 in | Wt 190.0 lb

## 2016-03-15 DIAGNOSIS — Q211 Atrial septal defect: Secondary | ICD-10-CM | POA: Diagnosis not present

## 2016-03-15 DIAGNOSIS — Q2112 Patent foramen ovale: Secondary | ICD-10-CM

## 2016-03-15 NOTE — Patient Instructions (Addendum)
Medication Instructions:  Your physician has recommended you make the following change in your medication:  1- You will need to hold your Plavix 7 days before surgery.  Labwork: NONE  Testing/Procedures: Your physician has requested that you have an echocardiogram with bubble study. Echocardiography is a painless test that uses sound waves to create images of your heart. It provides your doctor with information about the size and shape of your heart and how well your heart's chambers and valves are working. This procedure takes approximately one hour. There are no restrictions for this procedure.  Follow-Up: Your physician wants you to follow-up with Dr. Burt Knack to discuss PFO closure  Your physician wants you to follow-up in: 1 year with Dr. Johnsie Cancel. You will receive a reminder letter in the mail two months in advance. If you don't receive a letter, please call our office to schedule the follow-up appointment.     If you need a refill on your cardiac medications before your next appointment, please call your pharmacy.

## 2016-03-15 NOTE — Telephone Encounter (Signed)
Per medical clearance form, pt was seen by Dr. Johnsie Cancel for his PFO, and was marked as low risk for his surgical procedure. And stated he can stop plavix 7 days prior to surgery instead of 5.

## 2016-03-16 ENCOUNTER — Ambulatory Visit (HOSPITAL_COMMUNITY): Payer: Medicare Other | Attending: Cardiovascular Disease

## 2016-03-16 ENCOUNTER — Other Ambulatory Visit: Payer: Self-pay

## 2016-03-16 DIAGNOSIS — Z8673 Personal history of transient ischemic attack (TIA), and cerebral infarction without residual deficits: Secondary | ICD-10-CM | POA: Diagnosis not present

## 2016-03-16 DIAGNOSIS — I6529 Occlusion and stenosis of unspecified carotid artery: Secondary | ICD-10-CM | POA: Insufficient documentation

## 2016-03-16 DIAGNOSIS — Q211 Atrial septal defect: Secondary | ICD-10-CM | POA: Diagnosis not present

## 2016-03-16 DIAGNOSIS — I083 Combined rheumatic disorders of mitral, aortic and tricuspid valves: Secondary | ICD-10-CM | POA: Diagnosis not present

## 2016-03-16 DIAGNOSIS — I499 Cardiac arrhythmia, unspecified: Secondary | ICD-10-CM | POA: Diagnosis not present

## 2016-03-16 DIAGNOSIS — Q2112 Patent foramen ovale: Secondary | ICD-10-CM

## 2016-03-18 ENCOUNTER — Encounter (INDEPENDENT_AMBULATORY_CARE_PROVIDER_SITE_OTHER): Payer: Self-pay | Admitting: Orthopedic Surgery

## 2016-03-20 ENCOUNTER — Other Ambulatory Visit (INDEPENDENT_AMBULATORY_CARE_PROVIDER_SITE_OTHER): Payer: Self-pay | Admitting: Radiology

## 2016-03-20 MED ORDER — MUPIROCIN CALCIUM 2 % EX CREA: TOPICAL_CREAM | CUTANEOUS | 0 refills | Status: DC

## 2016-03-20 NOTE — Telephone Encounter (Signed)
Call pt to advise/discuss.

## 2016-03-20 NOTE — Telephone Encounter (Signed)
Message sent to patient regarding this and nasal ointment.

## 2016-03-22 ENCOUNTER — Ambulatory Visit (INDEPENDENT_AMBULATORY_CARE_PROVIDER_SITE_OTHER): Payer: Medicare Other | Admitting: Cardiovascular Disease

## 2016-03-22 ENCOUNTER — Telehealth: Payer: Self-pay | Admitting: Neurology

## 2016-03-22 ENCOUNTER — Encounter: Payer: Self-pay | Admitting: Cardiovascular Disease

## 2016-03-22 VITALS — BP 122/68 | HR 80 | Ht 71.0 in | Wt 190.6 lb

## 2016-03-22 DIAGNOSIS — Q211 Atrial septal defect: Secondary | ICD-10-CM

## 2016-03-22 DIAGNOSIS — Z8673 Personal history of transient ischemic attack (TIA), and cerebral infarction without residual deficits: Secondary | ICD-10-CM | POA: Diagnosis not present

## 2016-03-22 DIAGNOSIS — Q2112 Patent foramen ovale: Secondary | ICD-10-CM

## 2016-03-22 NOTE — Patient Instructions (Signed)
Medication Instructions:  No changes recommended at this time.  Labwork: No new orders.   Testing/Procedures: No new orders.   Follow-Up: You have been referred to Dr Erlinda Hong with [facility].   Rosalin Hawking, MD Neurology [place]  [address] [place], [place] [address]  [phone]  We will determine further follow-up after Neurology evaluation if needed.   Any Other Special Instructions Will Be Listed Below (If Applicable).     If you need a refill on your cardiac medications before your next appointment, please call your pharmacy.

## 2016-03-22 NOTE — Telephone Encounter (Signed)
Dr. Burt Knack referred him for second opinion of PFO and remote stroke. I can see him on 04/19/16 2:30pm. Thank you.   Rosalin Hawking, MD PhD Stroke Neurology 03/22/2016 6:04 PM

## 2016-03-22 NOTE — Progress Notes (Signed)
Cardiology Office Note Date:  03/22/2016   ID:  Robert, Mclaughlin 04/22/1945, MRN [id number]  PCP:  Melinda Crutch, MD  Cardiologist:  Sherren Mocha, MD    Chief Complaint  Patient presents with  . PFO consultation     History of Present Illness: Robert Mclaughlin is a 71 y.o. male who presents for evaluation of PFO. The patient initially presented in 2008 with symptoms of expressive aphasia and difficulty understanding. He was diagnosed with an acute stroke and received TPA. He has been found to have mild to moderate carotid stenosis with no indication for carotid endarterectomy. He's followed regularly by vascular surgery. A TEE was performed at the time of his stroke and it demonstrated a PFO with atrial septal aneurysm and moderate shunting at rest. At the time medical therapy was recommended. He's had no recurrent stroke symptoms. He presents today for evaluation and consideration of transcatheter PFO closure.  The patient is doing well. He is scheduled for left knee surgery and there were concerns about him holding antiplatelet medication around the time of surgery. He has no cardiac related symptoms. He's very active. He's had no recurrent neurologic symptoms. Today, he denies symptoms of palpitations, chest pain, shortness of breath, orthopnea, PND, lower extremity edema, dizziness, or syncope.  Past Medical History:  Diagnosis Date  . Arthritis   . Carotid artery occlusion   . Dysrhythmia    iiregular heart beat yrs ago  . PFO (patent foramen ovale)    diagnosed 04/2006  . Stroke [facility]) 2008    Past Surgical History:  Procedure Laterality Date  . TONSILLECTOMY      Current Outpatient Prescriptions  Medication Sig Dispense Refill  . ARTIFICIAL TEAR OP Apply 1 drop to eye daily.    . B Complex Vitamins (B COMPLEX PO) Take 1 tablet by mouth daily.     . clopidogrel (PLAVIX) 75 MG tablet Take 75 mg by mouth daily.     . diclofenac sodium (VOLTAREN) 1 % GEL Apply 1  application topically 4 (four) times daily as needed (muscle pain).     . Multiple Vitamin (MULTIVITAMIN) tablet Take 1 tablet by mouth daily.    . mupirocin cream (BACTROBAN) 2 % Apply intranasally with cotton tip swab twice daily for 5 days prior to surgery. 15 g 0  . simvastatin (ZOCOR) 20 MG tablet Take 20 mg by mouth every evening.     No current facility-administered medications for this visit.     Allergies:   Latanoprost and Lumigan [bimatoprost]   Social History:  The patient  reports that he quit smoking about 47 years ago. He has a 6.00 pack-year smoking history. He has never used smokeless tobacco. He reports that he drinks about 8.4 oz of alcohol per week . He reports that he does not use drugs.   Family History:  The patient's  family history includes Heart disease in his mother; Hyperlipidemia in his father; Varicose Veins in his mother.    ROS:  Please see the history of present illness.  Otherwise, review of systems is positive for knee pain.  All other systems are reviewed and negative.    PHYSICAL EXAM: VS:  BP 122/68   Pulse 80   Ht 5\' 11"  (1.803 m)   Wt 190 lb 9.6 oz (86.5 kg)   BMI 26.58 kg/m  , BMI Body mass index is 26.58 kg/m. GEN: Well nourished, well developed, in no acute distress  HEENT: normal  Neck: no JVD,  no masses. No carotid bruits Cardiac: RRR without murmur or gallop                Respiratory:  clear to auscultation bilaterally, normal work of breathing GI: soft, nontender, nondistended, + BS MS: no deformity or atrophy  Ext: no pretibial edema, pedal pulses 2+= bilaterally Skin: warm and dry, no rash Neuro:  Strength and sensation are intact Psych: euthymic mood, full affect  EKG:  EKG is not ordered today.  Recent Labs: No results found for requested labs within last 8760 hours.   Lipid Panel  No results found for: CHOL, TRIG, HDL, CHOLHDL, VLDL, LDLCALC, LDLDIRECT    Wt Readings from Last 3 Encounters:  03/22/16 190 lb 9.6 oz  (86.5 kg)  03/15/16 190 lb (86.2 kg)  03/01/16 188 lb (85.3 kg)     Cardiac Studies Reviewed: 2D Echo 03-16-2016: Study Conclusions  - Left ventricle: The cavity size was normal. There was mild   concentric hypertrophy. Systolic function was normal. The   estimated ejection fraction was in the range of 60% to 65%. Wall   motion was normal; there were no regional wall motion   abnormalities. Doppler parameters are consistent with abnormal   left ventricular relaxation (grade 1 diastolic dysfunction).   Doppler parameters are consistent with indetermiante ventricular   filling pressure. - Aortic valve: Transvalvular velocity was within the normal range.   There was no stenosis. There was mild regurgitation.   Regurgitation pressure half-time: 590 ms. - Mitral valve: Transvalvular velocity was within the normal range.   There was no evidence for stenosis. There was mild regurgitation   directed posteriorly. - Right ventricle: The cavity size was normal. Wall thickness was   normal. Systolic function was normal. - Atrial septum: There was a patent foramen ovale as noted by   saline microcavitation study. - Tricuspid valve: There was mild regurgitation. - Pulmonary arteries: Systolic pressure was within the normal   range. PA peak pressure: 29 mm Hg (S).  ASSESSMENT AND PLAN: PFO with atrial septal aneurysm: The patient had a stroke 10 years ago that occurred after working out at the gym. He was diagnosed at the time with a PFO and atrial septal aneurysm with moderate shunting at rest based on TEE study. He has been treated with antiplatelet therapy ever since that time and has had no recurrent events. I have reviewed available clinical trial data with the patient. He understands that he does fall outside of the typical inclusion criteria of PFO/stroke clinical trials since his stroke was so long ago. He does have some high risk anatomic features based on review of his TEE. We discussed  potential risks of transcatheter PFO closure and he understands that the risks of serious complications like stroke, MI, major bleeding, cardiac perforation, device embolization, and need for emergency surgery all occur at a very low frequency less than 1%. Since he has done so well on antiplatelet therapy and has never had any bleeding problems, I have recommended at least for the present time the continue with this approach. I am going to send him for neurologic evaluation to get further input into whether PFO closure should be considered. I did inform him of the importance of aggressive DVT prevention with his upcoming knee surgery and he understands this. I will communicate that to Dr. Marlou Sa.  Current medicines are reviewed with the patient today.  The patient does not have concerns regarding medicines.  Labs/ tests ordered today include:  Orders Placed This Encounter  Procedures  . Ambulatory referral to Neurology   Disposition:   FU as needed  Signed, Sherren Mocha, MD  03/22/2016 5:36 PM    [facility] [place], [place], [place]  [address] Phone: [phone]; Fax: [phone]

## 2016-03-23 ENCOUNTER — Telehealth: Payer: Self-pay

## 2016-03-23 NOTE — Telephone Encounter (Signed)
Sure. Sounds good. Thank you.  Rosalin Hawking, MD PhD Stroke Neurology 03/23/2016 1:15 PM

## 2016-03-23 NOTE — Telephone Encounter (Signed)
Patient is having knee surgery and did not want to schedule appt in February 2018 that was available per [facility] in referrals.

## 2016-03-23 NOTE — Telephone Encounter (Signed)
Patient saw Dr. Leonie Man in 2013. Pt does not want to see Dr .Leonie Man again. He wants to switch to Dr. Erlinda Hong. This will be a new patient for Dr.Xu.

## 2016-03-27 ENCOUNTER — Inpatient Hospital Stay (INDEPENDENT_AMBULATORY_CARE_PROVIDER_SITE_OTHER): Payer: Medicare Other | Admitting: Orthopedic Surgery

## 2016-03-27 ENCOUNTER — Telehealth (INDEPENDENT_AMBULATORY_CARE_PROVIDER_SITE_OTHER): Payer: Self-pay | Admitting: Orthopedic Surgery

## 2016-03-27 NOTE — Telephone Encounter (Signed)
Pt wanted to know if he could start back uflexa injections until he can have his knee surgery done. Please give pt a call.

## 2016-03-27 NOTE — Telephone Encounter (Signed)
What is the timeline on his appts for preop eval?  How long are we thinking until surgery should he be cleared?

## 2016-03-27 NOTE — Telephone Encounter (Signed)
Pt said he will be having surgery on his heart to close his PFA. He isnt sure when that surgery will be.

## 2016-03-28 ENCOUNTER — Encounter (HOSPITAL_COMMUNITY): Admission: RE | Payer: Self-pay | Source: Ambulatory Visit

## 2016-03-28 ENCOUNTER — Inpatient Hospital Stay (HOSPITAL_COMMUNITY): Admission: RE | Admit: 2016-03-28 | Payer: Medicare Other | Source: Ambulatory Visit | Admitting: Orthopedic Surgery

## 2016-03-28 SURGERY — ARTHROPLASTY, KNEE, TOTAL
Anesthesia: General | Site: Knee | Laterality: Left

## 2016-04-07 ENCOUNTER — Telehealth (INDEPENDENT_AMBULATORY_CARE_PROVIDER_SITE_OTHER): Payer: Self-pay | Admitting: Orthopedic Surgery

## 2016-04-07 NOTE — Telephone Encounter (Signed)
Pt would like to schedule Injections in his knees please advise  [phone]

## 2016-04-10 NOTE — Telephone Encounter (Signed)
Ok for knee injection pls clal thx

## 2016-04-10 NOTE — Telephone Encounter (Signed)
Patient cancelled surgery, per Taren, and he is having heart surgery to close the PFA and he will discuss surgery for knee later.  Please advise on injection in knee at this time?

## 2016-04-11 ENCOUNTER — Encounter (HOSPITAL_COMMUNITY): Payer: Self-pay | Admitting: General Practice

## 2016-04-11 ENCOUNTER — Ambulatory Visit (HOSPITAL_COMMUNITY)
Admission: RE | Admit: 2016-04-11 | Discharge: 2016-04-12 | Disposition: A | Discharge status: Home / Self Care | Payer: Medicare Other | Source: Ambulatory Visit | Attending: Cardiovascular Disease | Admitting: Cardiovascular Disease

## 2016-04-11 ENCOUNTER — Encounter (HOSPITAL_COMMUNITY)
Admission: RE | Disposition: A | Discharge status: Home / Self Care | Payer: Self-pay | Source: Ambulatory Visit | Attending: Cardiovascular Disease

## 2016-04-11 ENCOUNTER — Other Ambulatory Visit (HOSPITAL_COMMUNITY): Payer: Medicare Other

## 2016-04-11 DIAGNOSIS — Z87891 Personal history of nicotine dependence: Secondary | ICD-10-CM | POA: Diagnosis not present

## 2016-04-11 DIAGNOSIS — Z7902 Long term (current) use of antithrombotics/antiplatelets: Secondary | ICD-10-CM | POA: Diagnosis not present

## 2016-04-11 DIAGNOSIS — Z8249 Family history of ischemic heart disease and other diseases of the circulatory system: Secondary | ICD-10-CM | POA: Insufficient documentation

## 2016-04-11 DIAGNOSIS — Z8673 Personal history of transient ischemic attack (TIA), and cerebral infarction without residual deficits: Secondary | ICD-10-CM | POA: Insufficient documentation

## 2016-04-11 DIAGNOSIS — I6529 Occlusion and stenosis of unspecified carotid artery: Secondary | ICD-10-CM | POA: Diagnosis not present

## 2016-04-11 DIAGNOSIS — Q211 Atrial septal defect: Secondary | ICD-10-CM | POA: Diagnosis not present

## 2016-04-11 DIAGNOSIS — M1990 Unspecified osteoarthritis, unspecified site: Secondary | ICD-10-CM | POA: Insufficient documentation

## 2016-04-11 DIAGNOSIS — I253 Aneurysm of heart: Secondary | ICD-10-CM | POA: Diagnosis not present

## 2016-04-11 DIAGNOSIS — Q2112 Patent foramen ovale: Secondary | ICD-10-CM

## 2016-04-11 HISTORY — PX: PATENT FORAMEN OVALE CLOSURE: SHX2181

## 2016-04-11 HISTORY — DX: Migraine with aura, not intractable, without status migrainosus: G43.109

## 2016-04-11 LAB — BASIC METABOLIC PANEL
ANION GAP: 9 (ref 5–15)
BUN: 8 mg/dL (ref 6–20)
CHLORIDE: 104 mmol/L (ref 101–111)
CO2: 24 mmol/L (ref 22–32)
Calcium: 9.4 mg/dL (ref 8.9–10.3)
Creatinine, Ser: 1.08 mg/dL (ref 0.61–1.24)
GFR calc Af Amer: >60 mL/min (ref >60)
GLUCOSE: 100 mg/dL — AB (ref 65–99)
POTASSIUM: 4.2 mmol/L (ref 3.5–5.1)
Sodium: 137 mmol/L (ref 135–145)

## 2016-04-11 LAB — CBC
HCT: 45.2 % (ref 39.0–52.0)
HEMOGLOBIN: 15.4 g/dL (ref 13.0–17.0)
MCH: 30.4 pg (ref 26.0–34.0)
MCHC: 34.1 g/dL (ref 30.0–36.0)
MCV: 89.2 fL (ref 78.0–100.0)
PLATELETS: 210 10*3/uL (ref 150–400)
RBC: 5.07 MIL/uL (ref 4.22–5.81)
RDW: 13.1 % (ref 11.5–15.5)
WBC: 6.1 10*3/uL (ref 4.0–10.5)

## 2016-04-11 LAB — POCT ACTIVATED CLOTTING TIME
ACTIVATED CLOTTING TIME: 224 s
ACTIVATED CLOTTING TIME: 180 s
Activated Clotting Time: 191 seconds
Activated Clotting Time: 213 seconds

## 2016-04-11 LAB — PROTIME-INR
INR: 0.99
Prothrombin Time: 13.1 seconds (ref 11.4–15.2)

## 2016-04-11 SURGERY — PATENT FORAMEN OVALE (PFO) CLOSURE
Anesthesia: LOCAL

## 2016-04-11 MED ORDER — SODIUM CHLORIDE 0.9 % IV SOLN: 250.0000 mL | INTRAVENOUS | Status: DC | PRN

## 2016-04-11 MED ORDER — SODIUM CHLORIDE 0.9% FLUSH
3.0000 mL | Freq: Two times a day (BID) | INTRAVENOUS | Status: DC
  Administered 2016-04-11 – 2016-04-12 (×2): 3 mL via INTRAVENOUS

## 2016-04-11 MED ORDER — CEFAZOLIN IN D5W 1 GM/50ML IV SOLN: 1.0000 g | Freq: Once | INTRAVENOUS | Status: DC

## 2016-04-11 MED ORDER — HEPARIN (PORCINE) IN NACL 2-0.9 UNIT/ML-% IJ SOLN
INTRAMUSCULAR | Status: AC
  Filled 2016-04-11: qty 500

## 2016-04-11 MED ORDER — SODIUM CHLORIDE 0.9 % WEIGHT BASED INFUSION
3.0000 mL/kg/h | INTRAVENOUS | Status: DC
  Administered 2016-04-11: 10:00:00 via INTRAVENOUS

## 2016-04-11 MED ORDER — CLOPIDOGREL BISULFATE 75 MG PO TABS
75.0000 mg | ORAL_TABLET | Freq: Every day | ORAL | Status: DC
  Administered 2016-04-12: 75 mg via ORAL
  Filled 2016-04-11: qty 1

## 2016-04-11 MED ORDER — FENTANYL CITRATE (PF) 100 MCG/2ML IJ SOLN
INTRAMUSCULAR | Status: DC | PRN
Start: 2016-04-11 — End: 2016-04-11
  Administered 2016-04-11: 50 ug via INTRAVENOUS

## 2016-04-11 MED ORDER — HEPARIN SODIUM (PORCINE) 1000 UNIT/ML IJ SOLN
INTRAMUSCULAR | Status: DC | PRN
  Administered 2016-04-11: 1000 [IU] via INTRAVENOUS
  Administered 2016-04-11: 6000 [IU] via INTRAVENOUS

## 2016-04-11 MED ORDER — LIDOCAINE HCL (PF) 1 % IJ SOLN
INTRAMUSCULAR | Status: DC | PRN
  Administered 2016-04-11: 18 mL via INTRADERMAL

## 2016-04-11 MED ORDER — SODIUM CHLORIDE 0.9% FLUSH: 3.0000 mL | INTRAVENOUS | Status: DC | PRN

## 2016-04-11 MED ORDER — MIDAZOLAM HCL 2 MG/2ML IJ SOLN
INTRAMUSCULAR | Status: DC | PRN
  Administered 2016-04-11: 2 mg via INTRAVENOUS

## 2016-04-11 MED ORDER — LIDOCAINE HCL (PF) 1 % IJ SOLN
INTRAMUSCULAR | Status: AC
  Filled 2016-04-11: qty 30

## 2016-04-11 MED ORDER — SODIUM CHLORIDE 0.9 % IV SOLN: INTRAVENOUS | Status: AC

## 2016-04-11 MED ORDER — CEFAZOLIN IN D5W 1 GM/50ML IV SOLN
INTRAVENOUS | Status: DC | PRN
  Administered 2016-04-11: 1 g via INTRAVENOUS

## 2016-04-11 MED ORDER — CEFAZOLIN IN D5W 1 GM/50ML IV SOLN
INTRAVENOUS | Status: AC
Start: 2016-04-11 — End: 2016-04-11
  Filled 2016-04-11: qty 50

## 2016-04-11 MED ORDER — ASPIRIN EC 81 MG PO TBEC
81.0000 mg | DELAYED_RELEASE_TABLET | Freq: Every day | ORAL | Status: DC
  Administered 2016-04-12: 81 mg via ORAL
  Filled 2016-04-11: qty 1

## 2016-04-11 MED ORDER — HEPARIN (PORCINE) IN NACL 2-0.9 UNIT/ML-% IJ SOLN
INTRAMUSCULAR | Status: DC | PRN
  Administered 2016-04-11: 1000 mL

## 2016-04-11 MED ORDER — SIMVASTATIN 20 MG PO TABS
20.0000 mg | ORAL_TABLET | Freq: Every evening | ORAL | Status: DC
  Administered 2016-04-11: 20 mg via ORAL
  Filled 2016-04-11: qty 1

## 2016-04-11 MED ORDER — MIDAZOLAM HCL 2 MG/2ML IJ SOLN
INTRAMUSCULAR | Status: AC
  Filled 2016-04-11: qty 2

## 2016-04-11 MED ORDER — ONDANSETRON HCL 4 MG/2ML IJ SOLN: 4.0000 mg | Freq: Four times a day (QID) | INTRAMUSCULAR | Status: DC | PRN

## 2016-04-11 MED ORDER — HEPARIN SODIUM (PORCINE) 1000 UNIT/ML IJ SOLN
INTRAMUSCULAR | Status: AC
  Filled 2016-04-11: qty 1

## 2016-04-11 MED ORDER — ACETAMINOPHEN 325 MG PO TABS: 650.0000 mg | ORAL_TABLET | ORAL | Status: DC | PRN

## 2016-04-11 MED ORDER — SODIUM CHLORIDE 0.9 % WEIGHT BASED INFUSION: 1.0000 mL/kg/h | INTRAVENOUS | Status: DC

## 2016-04-11 MED ORDER — FENTANYL CITRATE (PF) 100 MCG/2ML IJ SOLN
INTRAMUSCULAR | Status: AC
  Filled 2016-04-11: qty 2

## 2016-04-11 MED ORDER — SODIUM CHLORIDE 0.9% FLUSH: 3.0000 mL | Freq: Two times a day (BID) | INTRAVENOUS | Status: DC

## 2016-04-11 MED ORDER — MELATONIN 3 MG PO TABS
3.0000 mg | ORAL_TABLET | Freq: Every day | ORAL | Status: DC
  Administered 2016-04-11: 3 mg via ORAL
  Filled 2016-04-11: qty 1

## 2016-04-11 SURGICAL SUPPLY — 14 items
CATH ACUNAV 8FR 90CM (CATHETERS) ×1 IMPLANT
CATH PRO-FLO 6F MPA-2 (CATHETERS) ×1 IMPLANT
COVER SWIFTLINK CONNECTOR (BAG) ×1 IMPLANT
GUIDEWIRE AMPLATZER 1.5JX260 (WIRE) ×1 IMPLANT
GUIDEWIRE ANGLED .035X150CM (WIRE) ×1 IMPLANT
OCCLUDER AMPLATZER PFO 25MM (Prosthesis & Implant Heart) ×1 IMPLANT
PACK CARDIAC CATHETERIZATION (CUSTOM PROCEDURE TRAY) ×2 IMPLANT
PROTECTION STATION PRESSURIZED (MISCELLANEOUS) ×2
SHEATH INTROD W/O MIN 9FR 25CM (SHEATH) ×1 IMPLANT
SHEATH PINNACLE 6F 10CM (SHEATH) ×1 IMPLANT
SHEATH PINNACLE 8F 10CM (SHEATH) ×1 IMPLANT
STATION PROTECTION PRESSURIZED (MISCELLANEOUS) IMPLANT
SYSTEM DELIVERY AMPLATZER 8FR (SHEATH) ×1 IMPLANT
WIRE EMERALD 3MM-J .035X150CM (WIRE) ×1 IMPLANT

## 2016-04-11 NOTE — Interval H&P Note (Signed)
History and Physical Interval Note:  04/11/2016 11:12 AM  Robert Mclaughlin  has presented today for surgery, with the diagnosis of PFO  The various methods of treatment have been discussed with the patient and family. After consideration of risks, benefits and other options for treatment, the patient has consented to  Procedure(s): Patent Forament Ovale(PFO) Closure (N/A) as a surgical intervention .  The patient's history has been reviewed, patient examined, no change in status, stable for surgery.  I have reviewed the patient's chart and labs.  Questions were answered to the patient's satisfaction.     Sherren Mocha

## 2016-04-11 NOTE — H&P (View-Only) (Signed)
Cardiology Office Note Date:  03/22/2016   ID:  Cicero, Wickes March 04, 1946, MRN [id number]  PCP:  Melinda Crutch, MD  Cardiologist:  Sherren Mocha, MD    Chief Complaint  Patient presents with  . PFO consultation     History of Present Illness: Robert Mclaughlin is a 71 y.o. male who presents for evaluation of PFO. The patient initially presented in 2008 with symptoms of expressive aphasia and difficulty understanding. He was diagnosed with an acute stroke and received TPA. He has been found to have mild to moderate carotid stenosis with no indication for carotid endarterectomy. He's followed regularly by vascular surgery. A TEE was performed at the time of his stroke and it demonstrated a PFO with atrial septal aneurysm and moderate shunting at rest. At the time medical therapy was recommended. He's had no recurrent stroke symptoms. He presents today for evaluation and consideration of transcatheter PFO closure.  The patient is doing well. He is scheduled for left knee surgery and there were concerns about him holding antiplatelet medication around the time of surgery. He has no cardiac related symptoms. He's very active. He's had no recurrent neurologic symptoms. Today, he denies symptoms of palpitations, chest pain, shortness of breath, orthopnea, PND, lower extremity edema, dizziness, or syncope.  Past Medical History:  Diagnosis Date  . Arthritis   . Carotid artery occlusion   . Dysrhythmia    iiregular heart beat yrs ago  . PFO (patent foramen ovale)    diagnosed 04/2006  . Stroke [facility]) 2008    Past Surgical History:  Procedure Laterality Date  . TONSILLECTOMY      Current Outpatient Prescriptions  Medication Sig Dispense Refill  . ARTIFICIAL TEAR OP Apply 1 drop to eye daily.    . B Complex Vitamins (B COMPLEX PO) Take 1 tablet by mouth daily.     . clopidogrel (PLAVIX) 75 MG tablet Take 75 mg by mouth daily.     . diclofenac sodium (VOLTAREN) 1 % GEL Apply 1  application topically 4 (four) times daily as needed (muscle pain).     . Multiple Vitamin (MULTIVITAMIN) tablet Take 1 tablet by mouth daily.    . mupirocin cream (BACTROBAN) 2 % Apply intranasally with cotton tip swab twice daily for 5 days prior to surgery. 15 g 0  . simvastatin (ZOCOR) 20 MG tablet Take 20 mg by mouth every evening.     No current facility-administered medications for this visit.     Allergies:   Latanoprost and Lumigan [bimatoprost]   Social History:  The patient  reports that he quit smoking about 47 years ago. He has a 6.00 pack-year smoking history. He has never used smokeless tobacco. He reports that he drinks about 8.4 oz of alcohol per week . He reports that he does not use drugs.   Family History:  The patient's  family history includes Heart disease in his mother; Hyperlipidemia in his father; Varicose Veins in his mother.    ROS:  Please see the history of present illness.  Otherwise, review of systems is positive for knee pain.  All other systems are reviewed and negative.    PHYSICAL EXAM: VS:  BP 122/68   Pulse 80   Ht 5\' 11"  (1.803 m)   Wt 190 lb 9.6 oz (86.5 kg)   BMI 26.58 kg/m  , BMI Body mass index is 26.58 kg/m. GEN: Well nourished, well developed, in no acute distress  HEENT: normal  Neck: no JVD,  no masses. No carotid bruits Cardiac: RRR without murmur or gallop                Respiratory:  clear to auscultation bilaterally, normal work of breathing GI: soft, nontender, nondistended, + BS MS: no deformity or atrophy  Ext: no pretibial edema, pedal pulses 2+= bilaterally Skin: warm and dry, no rash Neuro:  Strength and sensation are intact Psych: euthymic mood, full affect  EKG:  EKG is not ordered today.  Recent Labs: No results found for requested labs within last 8760 hours.   Lipid Panel  No results found for: CHOL, TRIG, HDL, CHOLHDL, VLDL, LDLCALC, LDLDIRECT    Wt Readings from Last 3 Encounters:  03/22/16 190 lb 9.6 oz  (86.5 kg)  03/15/16 190 lb (86.2 kg)  03/01/16 188 lb (85.3 kg)     Cardiac Studies Reviewed: 2D Echo 03-16-2016: Study Conclusions  - Left ventricle: The cavity size was normal. There was mild   concentric hypertrophy. Systolic function was normal. The   estimated ejection fraction was in the range of 60% to 65%. Wall   motion was normal; there were no regional wall motion   abnormalities. Doppler parameters are consistent with abnormal   left ventricular relaxation (grade 1 diastolic dysfunction).   Doppler parameters are consistent with indetermiante ventricular   filling pressure. - Aortic valve: Transvalvular velocity was within the normal range.   There was no stenosis. There was mild regurgitation.   Regurgitation pressure half-time: 590 ms. - Mitral valve: Transvalvular velocity was within the normal range.   There was no evidence for stenosis. There was mild regurgitation   directed posteriorly. - Right ventricle: The cavity size was normal. Wall thickness was   normal. Systolic function was normal. - Atrial septum: There was a patent foramen ovale as noted by   saline microcavitation study. - Tricuspid valve: There was mild regurgitation. - Pulmonary arteries: Systolic pressure was within the normal   range. PA peak pressure: 29 mm Hg (S).  ASSESSMENT AND PLAN: PFO with atrial septal aneurysm: The patient had a stroke 10 years ago that occurred after working out at the gym. He was diagnosed at the time with a PFO and atrial septal aneurysm with moderate shunting at rest based on TEE study. He has been treated with antiplatelet therapy ever since that time and has had no recurrent events. I have reviewed available clinical trial data with the patient. He understands that he does fall outside of the typical inclusion criteria of PFO/stroke clinical trials since his stroke was so long ago. He does have some high risk anatomic features based on review of his TEE. We discussed  potential risks of transcatheter PFO closure and he understands that the risks of serious complications like stroke, MI, major bleeding, cardiac perforation, device embolization, and need for emergency surgery all occur at a very low frequency less than 1%. Since he has done so well on antiplatelet therapy and has never had any bleeding problems, I have recommended at least for the present time the continue with this approach. I am going to send him for neurologic evaluation to get further input into whether PFO closure should be considered. I did inform him of the importance of aggressive DVT prevention with his upcoming knee surgery and he understands this. I will communicate that to Dr. Marlou Sa.  Current medicines are reviewed with the patient today.  The patient does not have concerns regarding medicines.  Labs/ tests ordered today include:  Orders Placed This Encounter  Procedures  . Ambulatory referral to Neurology   Disposition:   FU as needed  Signed, Sherren Mocha, MD  03/22/2016 5:36 PM    [facility] [place], [place], [place]  [address] Phone: [phone]; Fax: [phone]

## 2016-04-11 NOTE — Progress Notes (Signed)
Two venous sheaths RFV. Manual pressure held for 20 minutes, hemostasis obtained. Site level 0 pre and post sheath pull. Patient instructions given, tolerated well.

## 2016-04-11 NOTE — Telephone Encounter (Signed)
I called and left voicemail asked he please call back to schedule appointment with Dr.Dean for knee injection.

## 2016-04-11 NOTE — Care Management Note (Signed)
Case Management Note  Patient Details  Name: Robert Mclaughlin MRN: [id number] Date of Birth: May 15, 1945  Subjective/Objective:    S/p PFO closure, NCM will cont to follow for dc needs.                Action/Plan:   Expected Discharge Date:                  Expected Discharge Plan:  Home/Self Care  In-House Referral:     Discharge planning Services  CM Consult  Post Acute Care Choice:    Choice offered to:     DME Arranged:    DME Agency:     HH Arranged:    HH Agency:     Status of Service:  In process, will continue to follow  If discussed at Long Length of Stay Meetings, dates discussed:    Additional Comments:  Zenon Mayo, RN 04/11/2016, 2:52 PM

## 2016-04-12 ENCOUNTER — Ambulatory Visit (HOSPITAL_BASED_OUTPATIENT_CLINIC_OR_DEPARTMENT_OTHER): Payer: Medicare Other

## 2016-04-12 ENCOUNTER — Inpatient Hospital Stay (INDEPENDENT_AMBULATORY_CARE_PROVIDER_SITE_OTHER): Payer: Medicare Other | Admitting: Orthopedic Surgery

## 2016-04-12 DIAGNOSIS — Z8673 Personal history of transient ischemic attack (TIA), and cerebral infarction without residual deficits: Secondary | ICD-10-CM | POA: Diagnosis not present

## 2016-04-12 DIAGNOSIS — I6529 Occlusion and stenosis of unspecified carotid artery: Secondary | ICD-10-CM | POA: Diagnosis not present

## 2016-04-12 DIAGNOSIS — Z7902 Long term (current) use of antithrombotics/antiplatelets: Secondary | ICD-10-CM | POA: Diagnosis not present

## 2016-04-12 DIAGNOSIS — M1990 Unspecified osteoarthritis, unspecified site: Secondary | ICD-10-CM | POA: Diagnosis not present

## 2016-04-12 DIAGNOSIS — Q211 Atrial septal defect: Secondary | ICD-10-CM | POA: Diagnosis not present

## 2016-04-12 DIAGNOSIS — I253 Aneurysm of heart: Secondary | ICD-10-CM | POA: Diagnosis not present

## 2016-04-12 LAB — ECHOCARDIOGRAM LIMITED
Height: 71 in
WEIGHTICAEL: 2962.98 [oz_av]

## 2016-04-12 NOTE — Progress Notes (Signed)
  Echocardiogram 2D Echocardiogram has been performed.  Robert Mclaughlin 04/12/2016, 9:35 AM

## 2016-04-12 NOTE — Discharge Summary (Addendum)
Discharge Summary    Patient ID: Robert Mclaughlin,  MRN: [id number], DOB/AGE: [birth year] 71 y.o.  Admit date: 04/11/2016 Discharge date: 04/12/2016  Primary Care Provider: Melinda Crutch Primary Cardiologist: Dr. Burt Knack  Discharge Diagnoses    Active Problems:   PFO (patent foramen ovale)   Allergies Allergies  Allergen Reactions  . Latanoprost Other (See Comments)    Ineffective   . Lumigan [Bimatoprost] Other (See Comments)    Ineffective    Diagnostic Studies/Procedures    Patent Forament Ovale(PFO) Closure   04/11/16  Conclusion   SUCCESSFUL TRANSCATHETER PFO CLOSURE WITH A 25 MM AMPLATZER PFO OCCLUDER  RECOMMEND:  3 MONTHS DAPT WITH ASA AND PLAVIX  LIMITED ECHO IN AM  SBE PROPHYLAXIS WHEN INDICATED X 6 MONTHS   Echo 04/12/16 LV EF: 60% -   65%  ------------------------------------------------------------------- Indications:      Patent foramen ovale 745.5.  ------------------------------------------------------------------- History:   PMH:  Post PFO closure. Dysrhythmia. Carotid artery stenosis.  Stroke.  ------------------------------------------------------------------- Study Conclusions  - Left ventricle: The cavity size was normal. Wall thickness was   normal. Systolic function was normal. The estimated ejection   fraction was in the range of 60% to 65%. Wall motion was normal;   there were no regional wall motion abnormalities. Doppler   parameters are consistent with abnormal left ventricular   relaxation (grade 1 diastolic dysfunction). - Aortic valve: There was trivial regurgitation. - Mitral valve: There was mild regurgitation. - Right ventricle: The cavity size was moderately dilated. Systolic   function was moderately reduced. - Tricuspid valve: There was mild-moderate regurgitation.   History of Present Illness     Robert Mclaughlin is a 71 y.o. male presented for evaluation of PFO closer.   The patient initially presented in  2008 with symptoms of expressive aphasia and difficulty understanding. He was diagnosed with an acute stroke and received TPA. He has been found to have mild to moderate carotid stenosis with no indication for carotid endarterectomy. He's followed regularly by vascular surgery. A TEE was performed at the time of his stroke and it demonstrated a PFO with atrial septal aneurysm and moderate shunting at rest. At the time medical therapy was recommended. He's had no recurrent stroke symptoms. He presented for evaluation and consideration of transcatheter PFO closure 03/22/16 to Dr. Copper.   He is very active. He's had no recurrent neurologic symptoms. He was diagnosed at the time with a PFO and atrial septal aneurysm with moderate shunting at rest based on TEE study.    Hospital Course     Consultants: None  Has successful PFO closer. Will be on DAPT with ASA and plavix for 3 months. Will need SBE prophylactic x 6 months as needed. Limited echo showed normal LV function, grade 1 DD and mild MR. No groin complications.   F/u with APP in few weeks and 6 months with Dr. Burt Knack with echo.   The patient has been seen by Dr. Meda Coffee today and deemed ready for discharge home. All follow-up appointments have been scheduled. Discharge medications are listed below.     Discharge Vitals Blood pressure 131/82, pulse 72, temperature 98.3 F (36.8 C), temperature source Oral, resp. rate 20, height 5\' 11"  (1.803 m), weight 185 lb 3 oz (84 kg), SpO2 97 %.  Filed Weights   04/11/16 0947 04/11/16 1450 04/12/16 0650  Weight: 184 lb (83.5 kg) 184 lb (83.5 kg) 185 lb 3 oz (84 kg)   Physical Exam  GEN:No acute distress.   Neck:No JVD Cardiac:RRR, no murmurs, rubs, or gallops. R groin cath site stable.  Respiratory:Clear to auscultation bilaterally. [id number], nontender, non-distended  MS:No edema; No deformity. Neuro:Nonfocal  Psych: Normal affect    Labs & Radiologic Studies     CBC  Recent  Labs  04/11/16 1007  WBC 6.1  HGB 15.4  HCT 45.2  MCV 89.2  PLT [id number]   Basic Metabolic Panel  Recent Labs  04/11/16 1007  NA 137  K 4.2  CL 104  CO2 24  GLUCOSE 100*  BUN 8  CREATININE 1.08  CALCIUM 9.4   Liver Function Tests No results for input(s): AST, ALT, ALKPHOS, BILITOT, PROT, ALBUMIN in the last 72 hours. No results for input(s): LIPASE, AMYLASE in the last 72 hours. Cardiac Enzymes No results for input(s): CKTOTAL, CKMB, CKMBINDEX, TROPONINI in the last 72 hours. BNP Invalid input(s): POCBNP D-Dimer No results for input(s): DDIMER in the last 72 hours. Hemoglobin A1C No results for input(s): HGBA1C in the last 72 hours. Fasting Lipid Panel No results for input(s): CHOL, HDL, LDLCALC, TRIG, CHOLHDL, LDLDIRECT in the last 72 hours. Thyroid Function Tests No results for input(s): TSH, T4TOTAL, T3FREE, THYROIDAB in the last 72 hours.  Invalid input(s): FREET3  No results found.  Disposition   Pt is being discharged home today in good condition.  Follow-up Plans & Appointments    Follow-up Information    [place], [place]. Go on 04/26/2016.   Specialty:  Cardiology Why:  @3 :30 for post hospital  Contact information: [place] [address] [phone]          Discharge Instructions    Diet - low sodium heart healthy    Complete by:  As directed    Discharge instructions    Complete by:  As directed    No driving for 48 hours. No lifting over 5 lbs for 1 week. No sexual activity for 1 week.  Keep procedure site clean & dry. If you notice increased pain, swelling, bleeding or pus, call/return!  You may shower, but no soaking baths/hot tubs/pools for 1 week.   Increase activity slowly    Complete by:  As directed       Discharge Medications   Current Discharge Medication List    CONTINUE these medications which have NOT CHANGED   Details  ARTIFICIAL TEAR OP Apply 1 drop to eye daily.    aspirin EC 81 MG  tablet Take 81 mg by mouth daily.    B Complex Vitamins (B COMPLEX PO) Take 1 tablet by mouth daily.     clopidogrel (PLAVIX) 75 MG tablet Take 75 mg by mouth daily.     diclofenac sodium (VOLTAREN) 1 % GEL Apply 1 application topically 4 (four) times daily as needed (muscle pain).     Melatonin 3 MG TABS Take 3 mg by mouth at bedtime.    Multiple Vitamin (MULTIVITAMIN) tablet Take 1 tablet by mouth daily.    simvastatin (ZOCOR) 20 MG tablet Take 20 mg by mouth every evening.    mupirocin cream (BACTROBAN) 2 % Apply intranasally with cotton tip swab twice daily for 5 days prior to surgery. Qty: 15 g, Refills: 0    Sodium Chloride-Sodium Bicarb (NETI POT SINUS [place] NA) Place into the nose.          Outstanding Labs/Studies   Echo in 6 months.   Duration of Discharge Encounter   Greater than 30 minutes including physician  time.  Jarrett Soho PA-C 04/12/2016, 11:44 AM   The patient was seen, examined and discussed with Bhagat,Bhavinkumar PA-C and I agree with the above.   The patient is doing well post PFO closure yesterday. The access site in the right groin without significant bleeding, with no bruit and strong peripheral pulses. TTE today showed an occluder device in place, no obvious left to right flow, a bubble study with repeat TTE will be scheduled in 6 months. ASA + Plavix x 3 months, we will arrange for an outpatient follow up in few weeks with an extender and a follow up with Dr Burt Knack in 6 months. Stable for discharge.  Ena Dawley, MD 04/12/2016

## 2016-04-20 ENCOUNTER — Encounter: Payer: Self-pay | Admitting: Physician Assistant

## 2016-04-26 ENCOUNTER — Ambulatory Visit (INDEPENDENT_AMBULATORY_CARE_PROVIDER_SITE_OTHER): Payer: Medicare Other | Admitting: Physician Assistant

## 2016-04-26 ENCOUNTER — Encounter: Payer: Self-pay | Admitting: Physician Assistant

## 2016-04-26 VITALS — BP 130/68 | HR 78 | Ht 71.0 in | Wt 191.0 lb

## 2016-04-26 DIAGNOSIS — Q2112 Patent foramen ovale: Secondary | ICD-10-CM

## 2016-04-26 DIAGNOSIS — Q211 Atrial septal defect: Secondary | ICD-10-CM

## 2016-04-26 DIAGNOSIS — Z87891 Personal history of nicotine dependence: Secondary | ICD-10-CM | POA: Diagnosis not present

## 2016-04-26 DIAGNOSIS — Z8673 Personal history of transient ischemic attack (TIA), and cerebral infarction without residual deficits: Secondary | ICD-10-CM | POA: Diagnosis not present

## 2016-04-26 DIAGNOSIS — I6523 Occlusion and stenosis of bilateral carotid arteries: Secondary | ICD-10-CM

## 2016-04-26 MED ORDER — AMOXICILLIN 500 MG PO CAPS
2000.0000 mg | ORAL_CAPSULE | ORAL | 1 refills | Status: DC
Start: 2016-04-26 — End: 2016-07-13

## 2016-04-26 NOTE — Progress Notes (Signed)
Cardiology Office Note    Date:  04/26/2016   ID:  Robert Mclaughlin, DOB 07/22/1945, MRN [id number]  PCP:  Melinda Crutch, MD  Cardiologist: Dr. Burt Knack  Chief Complaint: Hospital follow up s/p  PFO closer  History of Present Illness:   Robert Mclaughlin is a 71 y.o. male carotid artery disease, stroke & status post PFO closer presents for follow-up.   The patient initially presented in 2008 with symptoms of expressive aphasia and difficulty understanding. He was diagnosed with an acute stroke and received TPA. He has been found to have mild to moderate carotid stenosis with no indication for carotid endarterectomy. He's followed regularly by vascular surgery. A TEE was performed at the time of his stroke and it demonstrated a PFO with atrial septal aneurysm and moderate shunting at rest. At the time medical therapy was recommended. He's had no recurrent stroke symptoms.   He presented for evaluation and consideration of transcatheter PFO closure 03/22/16 to Dr. Copper. He is very active. He's had no recurrent neurologic symptoms. He was diagnosed at the time with a PFO and atrial septal aneurysm with moderate shunting at rest based on TEE study.   Has successful PFO closer 04/11/16.  Will be on DAPT with ASA and plavix for 3 months. Will need SBE prophylactic x 6 months as needed. Limited echo showed normal LV function, grade 1 DD and mild MR. No groin complications.    He had today for follow-up. No complaints. The patient denies nausea, vomiting, fever, chest pain, palpitations, shortness of breath, orthopnea, PND, dizziness, syncope, cough, congestion, abdominal pain, hematochezia, melena, lower extremity edema. Past Medical History:  Diagnosis Date  . Arthritis    "left knee" (04/11/2016)  . Carotid artery occlusion   . Dysrhythmia    iiregular heart beat yrs ago  . Migraine equivalent    "none in years" (04/11/2016)  . PFO (patent foramen ovale)    diagnosed 04/2006  . Stroke [facility])  2008   denies residual on 04/11/2016)    Past Surgical History:  Procedure Laterality Date  . MASS EXCISION Right 2017   "precancerous growth on my leg"  . PATENT FORAMEN OVALE CLOSURE  04/11/2016  . PATENT FORAMEN OVALE CLOSURE N/A 04/11/2016   Procedure: Patent Forament Ovale(PFO) Closure;  Surgeon: Sherren Mocha, MD;  Location: [facility];  Service: Cardiovascular;  Laterality: N/A;  . TONSILLECTOMY      Current Medications: Prior to Admission medications   Medication Sig Start Date End Date Taking? Authorizing Provider  ARTIFICIAL TEAR OP Apply 1 drop to eye daily.    Historical Provider, MD  aspirin EC 81 MG tablet Take 81 mg by mouth daily.    Historical Provider, MD  B Complex Vitamins (B COMPLEX PO) Take 1 tablet by mouth daily.     Historical Provider, MD  clopidogrel (PLAVIX) 75 MG tablet Take 75 mg by mouth daily.  07/09/13   Historical Provider, MD  diclofenac sodium (VOLTAREN) 1 % GEL Apply 1 application topically 4 (four) times daily as needed (muscle pain).     Historical Provider, MD  Melatonin 3 MG TABS Take 3 mg by mouth at bedtime.    Historical Provider, MD  Multiple Vitamin (MULTIVITAMIN) tablet Take 1 tablet by mouth daily.    Historical Provider, MD  mupirocin cream (BACTROBAN) 2 % Apply intranasally with cotton tip swab twice daily for 5 days prior to surgery. Patient not taking: Reported on 04/07/2016 03/20/16   Meredith Pel,  MD  simvastatin (ZOCOR) 20 MG tablet Take 20 mg by mouth every evening.    Historical Provider, MD  Sodium Chloride-Sodium Bicarb (NETI POT SINUS [place] NA) Place into the nose.    Historical Provider, MD    Allergies:   Latanoprost and Lumigan [bimatoprost]   Social History   Social History  . Marital status: Married    Spouse name: N/A  . Number of children: N/A  . Years of education: N/A   Social History Main Topics  . Smoking status: Former Smoker    Packs/day: 1.00    Years: 6.00    Quit date: 03/13/1969  .  Smokeless tobacco: Never Used  . Alcohol use 8.4 oz/week    14 Cans of beer per week     Comment: 2 drinks per day  . Drug use: No  . Sexual activity: Yes   Other Topics Concern  . None   Social History Narrative  . None     Family History:  The patient's family history includes Heart disease in his mother; Hyperlipidemia in his father; Varicose Veins in his mother.  ROS:   Please see the history of present illness.    ROS All other systems reviewed and are negative.   PHYSICAL EXAM:   VS:  BP 130/68   Pulse 78   Ht 5\' 11"  (1.803 m)   Wt 191 lb (86.6 kg)   BMI 26.64 kg/m    GEN: Well nourished, well developed, in no acute distress  HEENT: normal  Neck: no JVD, carotid bruits, or masses Cardiac: RRR; no murmurs, rubs, or gallops,no edema  Respiratory:  clear to auscultation bilaterally, normal work of breathing GI: soft, nontender, nondistended, + BS MS: no deformity or atrophy  Skin: warm and dry, no rash Neuro:  Alert and Oriented x 3, Strength and sensation are intact Psych: euthymic mood, full affect  Wt Readings from Last 3 Encounters:  04/26/16 191 lb (86.6 kg)  04/12/16 185 lb 3 oz (84 kg)  03/22/16 190 lb 9.6 oz (86.5 kg)      Studies/Labs Reviewed:   EKG:  EKG is not ordered today.    Recent Labs: 04/11/2016: BUN 8; Creatinine, Ser 1.08; Hemoglobin 15.4; Platelets 210; Potassium 4.2; Sodium 137   Lipid Panel No results found for: CHOL, TRIG, HDL, CHOLHDL, VLDL, LDLCALC, LDLDIRECT  Additional studies/ records that were reviewed today include:   SUCCESSFUL TRANSCATHETER PFO CLOSURE WITH A 25 MM AMPLATZER PFO OCCLUDER 04/11/16  RECOMMEND:  3 MONTHS DAPT WITH ASA AND PLAVIX  LIMITED ECHO IN AM  SBE PROPHYLAXIS WHEN INDICATED X 6 MONTHS  Echocardiogram: 04/12/16 Study Conclusions  - Left ventricle: The cavity size was normal. Wall thickness was   normal. Systolic function was normal. The estimated ejection   fraction was in the range of 60%  to 65%. Wall motion was normal;   there were no regional wall motion abnormalities. Doppler   parameters are consistent with abnormal left ventricular   relaxation (grade 1 diastolic dysfunction). - Aortic valve: There was trivial regurgitation. - Mitral valve: There was mild regurgitation. - Right ventricle: The cavity size was moderately dilated. Systolic   function was moderately reduced. - Tricuspid valve: There was mild-moderate regurgitation.  Carotid: 09/22/15 plaque no stenosis  Echo: 2008 EF 60-65% mild eccentric AR large PFO by TEE with positive bubble     ASSESSMENT & PLAN:    1. PFO with atrial septal aneurysm s/p PFO closer 04/11/16 - Will be on DAPT  with ASA and plavix for 3 months (stop May 1st, 2018) . Will need SBE prophylactic x 6 months as needed. Limited echo showed normal LV function, grade 1 DD and mild MR. No groin complications.  - Follow up with Dr. Burt Knack in 6 months with echo.   2. Carotid artery disease - Last Carotid doppler 09/22/15 showed plaque no stenosis. Followed by Dr. Trula Slade.     Medication Adjustments/Labs and Tests Ordered: Current medicines are reviewed at length with the patient today.  Concerns regarding medicines are outlined above.  Medication changes, Labs and Tests ordered today are listed in the Patient Instructions below. Patient Instructions  Your physician recommends that you continue on your current medications as directed. Please refer to the Current Medication list given to you today.  Your physician has requested that you have an echocardiogram. Echocardiography is a painless test that uses sound waves to create images of your heart. It provides your doctor with information about the size and shape of your heart and how well your heart's chambers and valves are working. This procedure takes approximately one hour. There are no restrictions for this procedure. IN 6 MONTHS  Your physician recommends that you schedule a follow-up  appointment in:  [place]  ECHO SAME DAY   SBE CARD  GIVEN  REQUIRES  ANITIBIOTIC THERAPY  FOR   FIRST  6 MONTHS   AFTER PROCEDURE    Jarrett Soho, [place]  04/26/2016 3:48 PM    [facility] [place], [place], [place]  [address] Phone: [phone]; Fax: [phone]

## 2016-04-26 NOTE — Patient Instructions (Signed)
Your physician recommends that you continue on your current medications as directed. Please refer to the Current Medication list given to you today.  Your physician has requested that you have an echocardiogram. Echocardiography is a painless test that uses sound waves to create images of your heart. It provides your doctor with information about the size and shape of your heart and how well your heart's chambers and valves are working. This procedure takes approximately one hour. There are no restrictions for this procedure. IN 6 MONTHS  Your physician recommends that you schedule a follow-up appointment in:  [place]  ECHO SAME DAY   SBE CARD  GIVEN  REQUIRES  ANITIBIOTIC THERAPY  FOR   FIRST  6 MONTHS   AFTER PROCEDURE

## 2016-04-28 ENCOUNTER — Encounter (INDEPENDENT_AMBULATORY_CARE_PROVIDER_SITE_OTHER): Payer: Self-pay | Admitting: Orthopedic Surgery

## 2016-04-28 ENCOUNTER — Ambulatory Visit (INDEPENDENT_AMBULATORY_CARE_PROVIDER_SITE_OTHER): Payer: Medicare Other | Admitting: Orthopedic Surgery

## 2016-04-28 DIAGNOSIS — M1712 Unilateral primary osteoarthritis, left knee: Secondary | ICD-10-CM | POA: Insufficient documentation

## 2016-04-28 MED ORDER — DICLOFENAC SODIUM 1 % TD GEL: 1.0000 "application " | Freq: Four times a day (QID) | TRANSDERMAL | 3 refills | Status: DC | PRN

## 2016-04-29 DIAGNOSIS — M1712 Unilateral primary osteoarthritis, left knee: Secondary | ICD-10-CM | POA: Diagnosis not present

## 2016-04-29 MED ORDER — SODIUM HYALURONATE (VISCOSUP) 20 MG/2ML IX SOSY
20.0000 mg | PREFILLED_SYRINGE | INTRA_ARTICULAR | Status: AC | PRN
  Administered 2016-04-29: 20 mg via INTRA_ARTICULAR

## 2016-04-29 MED ORDER — LIDOCAINE HCL 1 % IJ SOLN
5.0000 mL | INTRAMUSCULAR | Status: AC | PRN
  Administered 2016-04-29: 5 mL

## 2016-04-29 NOTE — Progress Notes (Signed)
Office Visit Note   Patient: Robert Mclaughlin           Date of Birth: 1945-12-21           MRN: [id number] Visit Date: 04/28/2016 Requested by: Lawerance Cruel, MD [place], [place] [address] PCP: Melinda Crutch, MD  Subjective: Chief Complaint  Patient presents with  . Left Knee - Pain    HPI Robert Mclaughlin is a 71 year old patient with left knee pain.  He was scheduled for total knee replacement but he had PFO closure 04/11/2016.  He is on blood thinners for the 3-6 months following that surgery.  Requesting today an injection in the knee until he can have his surgery which will be about 3-6 months after his PFO closure renewed today.  He reports continued pain in the left knee with ambulation primarily on the medial side.              Review of Systems All systems reviewed are negative as they relate to the chief complaint within the history of present illness.  Patient denies  fevers or chills.    Assessment & Plan: Visit Diagnoses:  1. Primary osteoarthritis of left knee     Plan: Impression is left knee pain in stage arthritis with total knee replacement pending.  He could potentially have the surgery sometime in May.  For now we will inject the euflexxa into the knee beginning the series today of 3 injections.  We will schedule knee replacement for some time in May after he is given clearance and risk stratification from his cardiologist  Follow-Up Instructions: No Follow-up on file.   Orders:  No orders of the defined types were placed in this encounter.  Meds ordered this encounter  Medications  . diclofenac sodium (VOLTAREN) 1 % GEL    Sig: Apply 1 application topically 4 (four) times daily as needed (muscle pain).    Dispense:  1 Tube    Refill:  3      Procedures: Large Joint Inj Date/Time: 04/29/2016 1:32 PM Performed by: Meredith Pel Authorized by: Meredith Pel   Consent Given by:  Patient Site marked: the procedure site was marked    Timeout: prior to procedure the correct patient, procedure, and site was verified   Indications:  Pain, joint swelling and diagnostic evaluation Location:  Knee Site:  L knee Prep: patient was prepped and draped in usual sterile fashion   Needle Size:  18 G Needle Length:  1.5 inches Approach:  Superolateral Ultrasound Guidance: No   Fluoroscopic Guidance: No   Arthrogram: No   Medications:  5 mL lidocaine 1 %; 20 mg Sodium Hyaluronate 20 MG/2ML Patient tolerance:  Patient tolerated the procedure well with no immediate complications     Clinical Data: No additional findings.  Objective: Vital Signs: There were no vitals taken for this visit.  Physical Exam   Constitutional: Patient appears well-developed HEENT:  Head: Normocephalic Eyes:EOM are normal Neck: Normal range of motion Cardiovascular: Normal rate Pulmonary/chest: Effort normal Neurologic: Patient is alert Skin: Skin is warm Psychiatric: Patient has normal mood and affect    Ortho Exam orthopedic exam demonstrates pretty normal gait alignment but he does have some varus alignment in the left lower extremity.  Pedal pulses intact.  No groin pain interlocks rotation leg.  Has about a 10 flexion contraction that left knee.  Extensor mechanism is intact.  Specialty Comments:  No specialty comments available.  Imaging: No results  found.   PMFS History: Patient Active Problem List   Diagnosis Date Noted  . Primary osteoarthritis of left knee 04/28/2016  . PFO (patent foramen ovale) 04/11/2016  . Occlusion and stenosis of carotid artery without mention of cerebral infarction 07/24/2011   Past Medical History:  Diagnosis Date  . Arthritis    "left knee" (04/11/2016)  . Carotid artery occlusion   . Dysrhythmia    iiregular heart beat yrs ago  . Migraine equivalent    "none in years" (04/11/2016)  . PFO (patent foramen ovale)    diagnosed 04/2006  . Stroke [facility]) 2008   denies residual on 04/11/2016)     Family History  Problem Relation Age of Onset  . Heart disease Mother     Heart Disease before age 62  . Varicose Veins Mother   . Hyperlipidemia Father     Past Surgical History:  Procedure Laterality Date  . MASS EXCISION Right 2017   "precancerous growth on my leg"  . PATENT FORAMEN OVALE CLOSURE  04/11/2016  . PATENT FORAMEN OVALE CLOSURE N/A 04/11/2016   Procedure: Patent Forament Ovale(PFO) Closure;  Surgeon: Sherren Mocha, MD;  Location: [place] [facility];  Service: Cardiovascular;  Laterality: N/A;  . TONSILLECTOMY     Social History   Occupational History  . Not on file.   Social History Main Topics  . Smoking status: Former Smoker    Packs/day: 1.00    Years: 6.00    Quit date: 03/13/1969  . Smokeless tobacco: Never Used  . Alcohol use 8.4 oz/week    14 Cans of beer per week     Comment: 2 drinks per day  . Drug use: No  . Sexual activity: Yes

## 2016-05-05 ENCOUNTER — Ambulatory Visit (INDEPENDENT_AMBULATORY_CARE_PROVIDER_SITE_OTHER): Payer: Medicare Other | Admitting: Orthopedic Surgery

## 2016-05-05 ENCOUNTER — Encounter (INDEPENDENT_AMBULATORY_CARE_PROVIDER_SITE_OTHER): Payer: Self-pay | Admitting: Orthopedic Surgery

## 2016-05-05 DIAGNOSIS — M1712 Unilateral primary osteoarthritis, left knee: Secondary | ICD-10-CM

## 2016-05-07 DIAGNOSIS — M1712 Unilateral primary osteoarthritis, left knee: Secondary | ICD-10-CM

## 2016-05-07 MED ORDER — SODIUM HYALURONATE (VISCOSUP) 20 MG/2ML IX SOSY
20.0000 mg | PREFILLED_SYRINGE | INTRA_ARTICULAR | Status: AC | PRN
  Administered 2016-05-07: 20 mg via INTRA_ARTICULAR

## 2016-05-07 MED ORDER — LIDOCAINE HCL 1 % IJ SOLN
5.0000 mL | INTRAMUSCULAR | Status: AC | PRN
  Administered 2016-05-07: 5 mL

## 2016-05-07 NOTE — Progress Notes (Signed)
   Procedure Note  Patient: Robert Mclaughlin             Date of Birth: 06/05/1945           MRN: [id number]             Visit Date: 05/05/2016  Procedures: Visit Diagnoses: Primary osteoarthritis of left knee  Large Joint Inj Date/Time: 05/07/2016 7:46 PM Performed by: Meredith Pel Authorized by: Meredith Pel   Consent Given by:  Patient Site marked: the procedure site was marked   Timeout: prior to procedure the correct patient, procedure, and site was verified   Indications:  Pain, joint swelling and diagnostic evaluation Location:  Knee Site:  L knee Prep: patient was prepped and draped in usual sterile fashion   Needle Size:  18 G Needle Length:  1.5 inches Approach:  Superolateral Ultrasound Guidance: No   Fluoroscopic Guidance: No   Arthrogram: No   Medications:  5 mL lidocaine 1 %; 20 mg Sodium Hyaluronate 20 MG/2ML Patient tolerance:  Patient tolerated the procedure well with no immediate complications

## 2016-05-12 ENCOUNTER — Ambulatory Visit (INDEPENDENT_AMBULATORY_CARE_PROVIDER_SITE_OTHER): Payer: Medicare Other | Admitting: Orthopedic Surgery

## 2016-05-12 DIAGNOSIS — M1712 Unilateral primary osteoarthritis, left knee: Secondary | ICD-10-CM

## 2016-05-12 MED ORDER — SODIUM HYALURONATE (VISCOSUP) 20 MG/2ML IX SOSY
20.0000 mg | PREFILLED_SYRINGE | INTRA_ARTICULAR | Status: AC | PRN
  Administered 2016-05-12: 20 mg via INTRA_ARTICULAR

## 2016-05-12 MED ORDER — LIDOCAINE HCL 1 % IJ SOLN
5.0000 mL | INTRAMUSCULAR | Status: AC | PRN
  Administered 2016-05-12: 5 mL

## 2016-05-12 MED ORDER — METHYLPREDNISOLONE ACETATE 40 MG/ML IJ SUSP
40.0000 mg | INTRAMUSCULAR | Status: AC | PRN
  Administered 2016-05-12: 40 mg via INTRA_ARTICULAR

## 2016-05-12 NOTE — Progress Notes (Signed)
   Procedure Note  Patient: Robert Mclaughlin             Date of Birth: Dec 27, 1945           MRN: [id number]             Visit Date: 05/12/2016  Procedures: Visit Diagnoses: No diagnosis found.  Large Joint Inj Date/Time: 05/12/2016 10:21 AM Performed by: Meredith Pel Authorized by: Meredith Pel   Consent Given by:  Patient Site marked: the procedure site was marked   Timeout: prior to procedure the correct patient, procedure, and site was verified   Indications:  Pain, joint swelling and diagnostic evaluation Location:  Knee Site:  L knee Prep: patient was prepped and draped in usual sterile fashion   Needle Size:  18 G Needle Length:  1.5 inches Approach:  Superolateral Ultrasound Guidance: No   Fluoroscopic Guidance: No   Arthrogram: No   Medications:  5 mL lidocaine 1 %; 40 mg methylPREDNISolone acetate 40 MG/ML; 20 mg Sodium Hyaluronate 20 MG/2ML Patient tolerance:  Patient tolerated the procedure well with no immediate complications

## 2016-05-18 DIAGNOSIS — Z0000 Encounter for general adult medical examination without abnormal findings: Secondary | ICD-10-CM | POA: Diagnosis not present

## 2016-05-18 DIAGNOSIS — R972 Elevated prostate specific antigen [PSA]: Secondary | ICD-10-CM | POA: Diagnosis not present

## 2016-05-18 DIAGNOSIS — E7800 Pure hypercholesterolemia, unspecified: Secondary | ICD-10-CM | POA: Diagnosis not present

## 2016-05-18 DIAGNOSIS — Z79899 Other long term (current) drug therapy: Secondary | ICD-10-CM | POA: Diagnosis not present

## 2016-05-30 ENCOUNTER — Encounter: Payer: Self-pay | Admitting: Physician Assistant

## 2016-06-02 ENCOUNTER — Ambulatory Visit (INDEPENDENT_AMBULATORY_CARE_PROVIDER_SITE_OTHER): Payer: Medicare Other

## 2016-06-02 ENCOUNTER — Ambulatory Visit (INDEPENDENT_AMBULATORY_CARE_PROVIDER_SITE_OTHER): Payer: Medicare Other | Admitting: Orthopedic Surgery

## 2016-06-02 ENCOUNTER — Encounter (INDEPENDENT_AMBULATORY_CARE_PROVIDER_SITE_OTHER): Payer: Self-pay | Admitting: Orthopedic Surgery

## 2016-06-02 ENCOUNTER — Telehealth: Payer: Self-pay | Admitting: Physician Assistant

## 2016-06-02 DIAGNOSIS — M25562 Pain in left knee: Secondary | ICD-10-CM

## 2016-06-02 MED ORDER — BUPIVACAINE HCL 0.25 % IJ SOLN
4.0000 mL | INTRAMUSCULAR | Status: AC | PRN
  Administered 2016-06-02: 4 mL via INTRA_ARTICULAR

## 2016-06-02 MED ORDER — LIDOCAINE HCL 1 % IJ SOLN
5.0000 mL | INTRAMUSCULAR | Status: AC | PRN
  Administered 2016-06-02: 5 mL

## 2016-06-02 MED ORDER — ACETAMINOPHEN-CODEINE #3 300-30 MG PO TABS: 1.0000 | ORAL_TABLET | Freq: Four times a day (QID) | ORAL | 0 refills | Status: DC | PRN

## 2016-06-02 NOTE — Telephone Encounter (Signed)
Patient was advised to take the amoxicillin 30-60 minutes prior to his procedure. Patient asked how many tablets he should take. Patient has 500 mg tablets and was advised to take all 4 tablets (for a total of 2 grams) 30-60 minutes prior to his procedure for SBE prophylaxis. Patient verbalized understanding.

## 2016-06-02 NOTE — Addendum Note (Signed)
Addended by: Marcene Duos on: 06/02/2016 10:06 AM   Modules accepted: Orders

## 2016-06-02 NOTE — Progress Notes (Signed)
Office Visit Note   Patient: Robert Mclaughlin           Date of Birth: 1945/06/25           MRN: [id number] Visit Date: 06/02/2016 Requested by: Lawerance Cruel, MD [place], [place] [address] PCP: Melinda Crutch, MD  Subjective: Chief Complaint  Patient presents with  . Left Knee - Injury    HPI: Robert Mclaughlin is a 71 year old patient with known left knee arthritis.  He recently had PFO closure.  Scanning of his knee replaced after he is released from that procedure.  He was playing golf yesterday and he twisted his left knee.  Had a lot of increased pain since that time.  Last injection reflex in early March of this year.  Reports weakness giving way and popping.  He's tried ice and both Diclofenac gel and ice without relief             ROS: All systems reviewed are negative as they relate to the chief complaint within the history of present illness.  Patient denies  fevers or chills.   Assessment & Plan: Visit Diagnoses:  1. Acute pain of left knee     Plan: Impression is left knee pain with effusion and likely aggravation of pre-existing meniscal degeneration.  Collateral crucial ligaments feel stable.  Plan is aspiration and injection of Toradol today.  I think he is likely to proceed with knee replacement after he's released in 6 weeks from his TFo closure procedure.  He will call me when he wants to schedule knee replacement  Follow-Up Instructions: No Follow-up on file.   Orders:  Orders Placed This Encounter  Procedures  . XR Knee 1-2 Views Left   No orders of the defined types were placed in this encounter.     Procedures: Large Joint Inj Date/Time: 06/02/2016 9:55 AM Performed by: Meredith Pel Authorized by: Meredith Pel   Consent Given by:  Patient Site marked: the procedure site was marked   Timeout: prior to procedure the correct patient, procedure, and site was verified   Indications:  Pain, joint swelling and diagnostic  evaluation Location:  Knee Site:  L knee Prep: patient was prepped and draped in usual sterile fashion   Needle Size:  18 G Needle Length:  1.5 inches Approach:  Superolateral Ultrasound Guidance: No   Fluoroscopic Guidance: No   Arthrogram: No   Medications:  5 mL lidocaine 1 %; 4 mL bupivacaine 0.25 % Aspiration Attempted: Yes   Aspirate amount (mL):  25 Aspirate:  Blood-tinged Patient tolerance:  Patient tolerated the procedure well with no immediate complications     Clinical Data: No additional findings.  Objective: Vital Signs: There were no vitals taken for this visit.  Physical Exam:   Constitutional: Patient appears well-developed HEENT:  Head: Normocephalic Eyes:EOM are normal Neck: Normal range of motion Cardiovascular: Normal rate Pulmonary/chest: Effort normal Neurologic: Patient is alert Skin: Skin is warm Psychiatric: Patient has normal mood and affect    Ortho Exam: Orthopedic exam demonstrates left knee effusion collaterals are stable anterior cruciate ligament PCL intact pedal pulses palpable extensor mechanism is intact he's got full extension to about 60 of flexion.  Radiographs show no fracture.  No groin pain with internal/external rotation of the leg  Specialty Comments:  No specialty comments available.  Imaging: Xr Knee 1-2 Views Left  Result Date: 06/02/2016 AP lateral left knee reviewed.  Tricompartmental degenerative changes present worsen the medial  compartment.  Posterior condylar spurring is present.  No loose bodies no fractures noted.  No other soft tissue calcifications.    PMFS History: Patient Active Problem List   Diagnosis Date Noted  . Primary osteoarthritis of left knee 04/28/2016  . PFO (patent foramen ovale) 04/11/2016  . Occlusion and stenosis of carotid artery without mention of cerebral infarction 07/24/2011   Past Medical History:  Diagnosis Date  . Arthritis    "left knee" (04/11/2016)  . Carotid artery  occlusion   . Dysrhythmia    iiregular heart beat yrs ago  . Migraine equivalent    "none in years" (04/11/2016)  . PFO (patent foramen ovale)    diagnosed 04/2006  . Stroke [facility]) 2008   denies residual on 04/11/2016)    Family History  Problem Relation Age of Onset  . Heart disease Mother     Heart Disease before age 75  . Varicose Veins Mother   . Hyperlipidemia Father     Past Surgical History:  Procedure Laterality Date  . MASS EXCISION Right 2017   "precancerous growth on my leg"  . PATENT FORAMEN OVALE CLOSURE  04/11/2016  . PATENT FORAMEN OVALE CLOSURE N/A 04/11/2016   Procedure: Patent Forament Ovale(PFO) Closure;  Surgeon: Sherren Mocha, MD;  Location: [place] [facility];  Service: Cardiovascular;  Laterality: N/A;  . TONSILLECTOMY     Social History   Occupational History  . Not on file.   Social History Main Topics  . Smoking status: Former Smoker    Packs/day: 1.00    Years: 6.00    Quit date: 03/13/1969  . Smokeless tobacco: Never Used  . Alcohol use 8.4 oz/week    14 Cans of beer per week     Comment: 2 drinks per day  . Drug use: No  . Sexual activity: Yes

## 2016-06-02 NOTE — Telephone Encounter (Signed)
Mr. Garis is calling about the Amoxicillin that was prescribe for him . He is wanting to know when does he start taking it . His appt with the dentist is on Monday . Do he take it before or after . Please Call    Thanks

## 2016-06-16 ENCOUNTER — Encounter: Payer: Self-pay | Admitting: Physician Assistant

## 2016-06-27 ENCOUNTER — Ambulatory Visit: Payer: Medicare Other | Admitting: Neurology

## 2016-06-27 ENCOUNTER — Other Ambulatory Visit (INDEPENDENT_AMBULATORY_CARE_PROVIDER_SITE_OTHER): Payer: Self-pay | Admitting: Orthopedic Surgery

## 2016-06-27 DIAGNOSIS — M1712 Unilateral primary osteoarthritis, left knee: Secondary | ICD-10-CM

## 2016-07-13 ENCOUNTER — Telehealth: Payer: Self-pay | Admitting: Cardiovascular Disease

## 2016-07-13 DIAGNOSIS — Q2112 Patent foramen ovale: Secondary | ICD-10-CM

## 2016-07-13 DIAGNOSIS — Q211 Atrial septal defect: Secondary | ICD-10-CM

## 2016-07-13 MED ORDER — AMOXICILLIN 500 MG PO CAPS: ORAL_CAPSULE | ORAL | 2 refills | Status: DC

## 2016-07-13 NOTE — Telephone Encounter (Signed)
Patient will take amoxicillin 2gm 1 hours prior to dental procedure for SBE prophylaxis. No refills unless he is having procedure.

## 2016-07-13 NOTE — Telephone Encounter (Signed)
Jeanann Lewandowsky, RMA routed conversation to You 1 minute ago (1:53 PM)    [employer], PA  [place], [place] 22 minutes ago (1:32 PM)      Patient will take amoxicillin 2gm 1 hours prior to dental procedure for SBE prophylaxis. No refills unless he is having procedure.       Documentation     You  [employer], [place] 1 hour ago (12:35 PM)    Pt is having dental procedures, needs this filled & 2 refills as he has 2 more procedures. Thanks  (Routing comment)     You  Melonie [place] 1 hour ago (12:33 PM)    Virgilio Frees routed conversation to [facility] Refill 2 hours ago (11:33 AM)    Virgilio Frees 2 hours ago (11:32 AM)      New Message    *STAT* If patient is at the pharmacy, call can be transferred to refill team.   1. Which medications need to be refilled? (please list name of each medication and dose if known) amoxicillin (AMOXIL) 500 MG capsule  2. Which pharmacy/location (including street and city if local pharmacy) is medication to be sent to? [address]  3. Do they need a 30 day or 90 day supply? 30 day

## 2016-07-13 NOTE — Telephone Encounter (Signed)
New Message    *STAT* If patient is at the pharmacy, call can be transferred to refill team.   1. Which medications need to be refilled? (please list name of each medication and dose if known) amoxicillin (AMOXIL) 500 MG capsule  2. Which pharmacy/location (including street and city if local pharmacy) is medication to be sent to? [address]  3. Do they need a 30 day or 90 day supply? 30 day

## 2016-08-10 ENCOUNTER — Other Ambulatory Visit: Payer: Self-pay

## 2016-08-10 DIAGNOSIS — Q211 Atrial septal defect: Secondary | ICD-10-CM

## 2016-08-10 DIAGNOSIS — Q2112 Patent foramen ovale: Secondary | ICD-10-CM

## 2016-08-11 ENCOUNTER — Encounter (HOSPITAL_COMMUNITY): Payer: Self-pay

## 2016-08-11 ENCOUNTER — Encounter (HOSPITAL_COMMUNITY)
Admission: RE | Admit: 2016-08-11 | Discharge: 2016-08-11 | Disposition: A | Discharge status: Home / Self Care | Payer: Medicare Other | Source: Ambulatory Visit | Attending: Orthopedic Surgery | Admitting: Orthopedic Surgery

## 2016-08-11 ENCOUNTER — Telehealth: Payer: Self-pay | Admitting: Cardiovascular Disease

## 2016-08-11 DIAGNOSIS — Z7982 Long term (current) use of aspirin: Secondary | ICD-10-CM | POA: Insufficient documentation

## 2016-08-11 DIAGNOSIS — M1712 Unilateral primary osteoarthritis, left knee: Secondary | ICD-10-CM | POA: Diagnosis not present

## 2016-08-11 DIAGNOSIS — Z419 Encounter for procedure for purposes other than remedying health state, unspecified: Secondary | ICD-10-CM

## 2016-08-11 DIAGNOSIS — Z01818 Encounter for other preprocedural examination: Secondary | ICD-10-CM | POA: Insufficient documentation

## 2016-08-11 DIAGNOSIS — Z79899 Other long term (current) drug therapy: Secondary | ICD-10-CM | POA: Diagnosis not present

## 2016-08-11 LAB — HEPATIC FUNCTION PANEL
ALBUMIN: 4.2 g/dL (ref 3.5–5.0)
ALK PHOS: 37 U/L — AB (ref 38–126)
ALT: 22 U/L (ref 17–63)
AST: 29 U/L (ref 15–41)
Bilirubin, Direct: 0.1 mg/dL (ref 0.1–0.5)
Indirect Bilirubin: 0.9 mg/dL (ref 0.3–0.9)
Total Bilirubin: 1 mg/dL (ref 0.3–1.2)
Total Protein: 7.3 g/dL (ref 6.5–8.1)

## 2016-08-11 LAB — CBC
HEMATOCRIT: 44.9 % (ref 39.0–52.0)
Hemoglobin: 15 g/dL (ref 13.0–17.0)
MCH: 30.3 pg (ref 26.0–34.0)
MCHC: 33.4 g/dL (ref 30.0–36.0)
MCV: 90.7 fL (ref 78.0–100.0)
PLATELETS: 206 10*3/uL (ref 150–400)
RBC: 4.95 MIL/uL (ref 4.22–5.81)
RDW: 13.7 % (ref 11.5–15.5)
WBC: 5.6 10*3/uL (ref 4.0–10.5)

## 2016-08-11 LAB — URINALYSIS, ROUTINE W REFLEX MICROSCOPIC
BACTERIA UA: NONE SEEN
BILIRUBIN URINE: NEGATIVE
Glucose, UA: NEGATIVE mg/dL
KETONES UR: NEGATIVE mg/dL
LEUKOCYTES UA: NEGATIVE
Nitrite: NEGATIVE
Protein, ur: NEGATIVE mg/dL
SPECIFIC GRAVITY, URINE: 1.016 (ref 1.005–1.030)
SQUAMOUS EPITHELIAL / LPF: NONE SEEN
pH: 5 (ref 5.0–8.0)

## 2016-08-11 LAB — SURGICAL PCR SCREEN
MRSA, PCR: NEGATIVE
Staphylococcus aureus: NEGATIVE

## 2016-08-11 LAB — BASIC METABOLIC PANEL
Anion gap: 6 (ref 5–15)
BUN: 14 mg/dL (ref 6–20)
CHLORIDE: 105 mmol/L (ref 101–111)
CO2: 25 mmol/L (ref 22–32)
CREATININE: 1.07 mg/dL (ref 0.61–1.24)
Calcium: 9.5 mg/dL (ref 8.9–10.3)
GFR calc Af Amer: >60 mL/min (ref >60)
GFR calc non Af Amer: >60 mL/min (ref >60)
Glucose, Bld: 104 mg/dL — ABNORMAL HIGH (ref 65–99)
POTASSIUM: 4.4 mmol/L (ref 3.5–5.1)
SODIUM: 136 mmol/L (ref 135–145)

## 2016-08-11 NOTE — Telephone Encounter (Signed)
Will route to Dr. Burt Knack for review and advisement.

## 2016-08-11 NOTE — Progress Notes (Addendum)
PCP - Dr. Lona Kettle Cardiologist - Dr. Burt Knack  Chest x-ray - 08/11/2016  EKG -  04/11/16 Stress Test - ECHO - 04/12/16 Cardiac Cath - 04/11/16  Pt recently had PFO surgery and was placed on Aspirin by Dr. Burt Knack. Left message with Dr. Randel Pigg office regarding when to stop Aspirin. Per Dr. Marlou Sa, need to contact Dr. Antionette Char office regarding when patient is to stop aspirin. Message left for Dr. Burt Knack through answering service.   Patient denies shortness of breath, fever, cough and chest pain at PAT appointment   Patient verbalized understanding of instructions that were given to them at the PAT appointment. Patient was also instructed that they will need to review over the PAT instructions again at home before surgery.

## 2016-08-11 NOTE — Pre-Procedure Instructions (Signed)
Robert Mclaughlin  08/11/2016      [id number] [name], [place] [address] [place] [address] Phone: [phone] Fax: [phone]    Your procedure is scheduled on Tuesday June 12.  Report to [facility] Admitting at 10:15 A.M.  Call this number if you have problems the morning of surgery:  [phone]   Remember:  Do not eat food or drink liquids after midnight.  Take these medicines the morning of surgery with A SIP OF WATER: NONE  7 days prior to surgery STOP taking any Aleve, Naproxen, Ibuprofen, Motrin, Advil, Goody's, BC's, all herbal medications, fish oil, and all vitamins  Follow MD's instructions on stopping Aspirin    Do not wear jewelry, make-up or nail polish.  Do not wear lotions, powders, or perfumes, or deoderant.    Men may shave face and neck.  Do not bring valuables to the hospital.  [facility] is not responsible for any belongings or valuables.  Contacts, dentures or bridgework may not be worn into surgery.  Leave your suitcase in the car.  After surgery it may be brought to your room.  For patients admitted to the hospital, discharge time will be determined by your treatment team.  Patients discharged the day of surgery will not be allowed to drive home.    Special instructions:    [place]- Preparing For Surgery  Before surgery, you can play an important role. Because skin is not sterile, your skin needs to be as free of germs as possible. You can reduce the number of germs on your skin by washing with CHG (chlorahexidine gluconate) Soap before surgery.  CHG is an antiseptic cleaner which kills germs and bonds with the skin to continue killing germs even after washing.  Please do not use if you have an allergy to CHG or antibacterial soaps. If your skin becomes reddened/irritated stop using the CHG.  Do not shave (including legs and underarms) for at least 48 hours prior to first CHG  shower. It is OK to shave your face.  Please follow these instructions carefully.   1. Shower the NIGHT BEFORE SURGERY and the MORNING OF SURGERY with CHG.   2. If you chose to wash your hair, wash your hair first as usual with your normal shampoo.  3. After you shampoo, rinse your hair and body thoroughly to remove the shampoo.  4. Use CHG as you would any other liquid soap. You can apply CHG directly to the skin and wash gently with a scrungie or a clean washcloth.   5. Apply the CHG Soap to your body ONLY FROM THE NECK DOWN.  Do not use on open wounds or open sores. Avoid contact with your eyes, ears, mouth and genitals (private parts). Wash genitals (private parts) with your normal soap.  6. Wash thoroughly, paying special attention to the area where your surgery will be performed.  7. Thoroughly rinse your body with warm water from the neck down.  8. DO NOT shower/wash with your normal soap after using and rinsing off the CHG Soap.  9. Pat yourself dry with a CLEAN TOWEL.   10. Wear CLEAN PAJAMAS   11. Place CLEAN SHEETS on your bed the night of your first shower and DO NOT SLEEP WITH PETS.    Day of Surgery: Do not apply any deodorants/lotions. Please wear clean clothes to the hospital/surgery center.      Please read over the following  fact sheets that you were given. Total Joint Packet and MRSA Information

## 2016-08-11 NOTE — Telephone Encounter (Signed)
New Message  Request for surgical clearance:  1. What type of surgery is being performed? Knee Replacement/LEFT KNEE OSTEOARTHRITIS  2. When is this surgery scheduled? 6.12.18  Are there any medications that need to be held prior to surgery and how long? TakingNot TakingUnknown  aspirin EC 81 MG tablet 3. Take 81 mg by mouth daily  4. Name of physician performing surgery? Meredith Pel, MD  5. What is your office phone and fax number? Office: [phone] Fax: [phone]

## 2016-08-12 LAB — URINE CULTURE: Culture: NO GROWTH

## 2016-08-14 ENCOUNTER — Other Ambulatory Visit (INDEPENDENT_AMBULATORY_CARE_PROVIDER_SITE_OTHER): Payer: Self-pay | Admitting: Orthopedic Surgery

## 2016-08-14 ENCOUNTER — Telehealth: Payer: Self-pay | Admitting: Cardiovascular Disease

## 2016-08-14 NOTE — Telephone Encounter (Signed)
Called Short Stay and spoke with a nurse who saw Dr. Antionette Char note and is aware pt can take 81 mg ASA or that it can be held 5 days prior to surgery if necessary and that the ASA can be restarted postoperatively. Called pt and left a voicemail that he can continue the 81 mg ASA per Dr. Antionette Char recommendation and if pt had any questions to give our office a call back.

## 2016-08-14 NOTE — Progress Notes (Signed)
Spoke to Santiago Glad at Dr. Randel Pigg office regarding Dr. Antionette Char note on Aspirin. States she will notify Dr. Marlou Sa and notify patient of instructions on stopping aspirin.

## 2016-08-14 NOTE — Telephone Encounter (Signed)
New message    Robert Mclaughlin received messaged from [facility] about pt aspirin. She is calling per Dr. Marlou Sa to see if pt can receive tranexamic acid prior to surgery.

## 2016-08-14 NOTE — Telephone Encounter (Signed)
Made pt aware to continue the ASA 81 mg daily per Dr. Burt Knack.

## 2016-08-14 NOTE — Telephone Encounter (Signed)
Follow up   Pt verbalized that he is returning call to rn

## 2016-08-14 NOTE — Telephone Encounter (Signed)
Will route to Dr. Burt Knack to see if it is okay for pt to receive the Antifibrinolytic, Tranexamic Acid prior to the surgery.

## 2016-08-14 NOTE — Progress Notes (Signed)
Anesthesia Chart Review: Patient is a 71 year old male scheduled for left TKA on 08/22/16 by Dr. Marlou Sa. Surgery was initially scheduled for 03/28/16, but was delayed until he could be evaluated by cardiology for PFO with CVA history. Patient is now s/p PFO closure 04/11/16.   History includes former smoker (quit '71), arthritis, CVA (possibly related to PFO) 04/18/06 s/p TPA, PFO s/p transcatheter PFO closure 04/11/16, dysrhythmia ("irregular heart beat" years ago), tonsillectomy, carotid artery occlusive disease (16% LICA stenosis by MRA 04/18/06, but no significant proximal ICA stenosis noted by Duplex 09/22/15).  - PCP is Dr. Myriam Jacobson.   - Cardiologist is Dr. Sherren Mocha, last visit with Leanor Kail, PA-C 04/26/16. On 08/11/16, Dr. Burt Knack felt patient was okay to proceed with surgery, and recommended to continue ASA perioperatively if possible.  - Vascular surgeon is Dr. Trula Slade, last visit with Clemon Chambers, NP on 09/27/15. - Neurologist was Dr. Leonie Man, but discharged back to PCP follow-up after 10/02/11 visit.   Meds include aspirin 81 mg, melatonin, simvastatin, amoxicillin (prior to dental procedures for SBE prophylaxis for first six months after PFO closure). Patient to continue ASA perioperatively.   BP 115/82   Pulse 69   Temp 36.6 C (Oral)   Resp 20   Ht 5\' 10"  (1.778 m)   Wt 183 lb 9.6 oz (83.3 kg)   SpO2 99%   BMI 26.34 kg/m   EKG 04/11/16: NSR.   Limited Echo 04/12/16: Study Conclusions - Left ventricle: The cavity size was normal. Wall thickness was   normal. Systolic function was normal. The estimated ejection   fraction was in the range of 60% to 65%. Wall motion was normal;   there were no regional wall motion abnormalities. Doppler   parameters are consistent with abnormal left ventricular   relaxation (grade 1 diastolic dysfunction). - Aortic valve: There was trivial regurgitation. - Mitral valve: There was mild regurgitation. - Right ventricle: The cavity  size was moderately dilated. Systolic   function was moderately reduced. - Tricuspid valve: There was mild-moderate regurgitation. - Atrial septum:  There is a PFO closure device in place. No evidence of residual shunting across the atrial septum. (Comparision echo 03/16/16 showed LVEF 10-96%, grade I diastolic dysfunction, mild AR, mild MR, normal RV systolic function, mild TR, PA peak pressure 29 mmHg, PFO.) Follow-up echo is scheduled 09/04/16.  PFO Closure 04/11/16: SUCCESSFUL TRANSCATHETER PFO CLOSURE WITH A 25 MM AMPLATZER PFO OCCLUDER RECOMMEND:  3 MONTHS DAPT WITH ASA AND PLAVIX  LIMITED ECHO IN AM  SBE PROPHYLAXIS WHEN INDICATED X 6 MONTHS  Carotid U/S 09/22/15: Doppler velocities suggest no hemodynamically significant stenosis of bilateral proximal ICA's.  CXR 08/11/16: IMPRESSION:  PFO occlusion device. No acute abnormalities.  Preoperative labs noted. Urine culture showed no growth.   I updated anesthesiologist Dr. Kalman Shan about recent PFO closure and followo-up echo results. As above, Dr. Burt Knack feels patient is okay to proceed. If no acute changes then I anticipate that he can proceed as planned.  [facility] Short Stay Center/Anesthesiology Phone [phone] 08/14/2016 5:09 PM

## 2016-08-14 NOTE — Telephone Encounter (Signed)
Pt ok to proceed with surgery. He has discontinued plavix. If he can stay on aspirin 81 mg that would be best. However, if it needs to be held 5 days prior to surgery this is acceptable and can be started back post-operatively. Please let me know if any questions.

## 2016-08-14 NOTE — Telephone Encounter (Signed)
Talked to Terin pertaining to pt for his upcoming surgery. Terin wanted to know per Dr. Marlou Sa, if it was ok for pt to take the baby aspirin. Advised to Terin that per Dr. Burt Knack, it is okay for pt to continue taking the ASA 81 mg daily. Also advised to Terin, I forwarded a message to Dr. Burt Knack asking if it was okay for pt to take the Tranexamic acid prior to surgery.

## 2016-08-18 NOTE — Telephone Encounter (Signed)
Discussed tranexamic acid with Dr Burt Knack and no restriction with pt having this during surgery.  I will make surgeon's office aware.

## 2016-08-22 ENCOUNTER — Encounter (HOSPITAL_COMMUNITY)
Admission: RE | Disposition: A | Discharge status: Home Health | Payer: Self-pay | Source: Ambulatory Visit | Attending: Orthopedic Surgery

## 2016-08-22 ENCOUNTER — Encounter (HOSPITAL_COMMUNITY): Payer: Self-pay | Admitting: *Deleted

## 2016-08-22 ENCOUNTER — Inpatient Hospital Stay (HOSPITAL_COMMUNITY): Payer: Medicare Other | Admitting: Vascular Surgery

## 2016-08-22 ENCOUNTER — Inpatient Hospital Stay (HOSPITAL_COMMUNITY)
Admission: RE | Admit: 2016-08-22 | Discharge: 2016-08-25 | DRG: 470 | Disposition: A | Discharge status: Home Health | Payer: Medicare Other | Source: Ambulatory Visit | Attending: Orthopedic Surgery | Admitting: Orthopedic Surgery

## 2016-08-22 ENCOUNTER — Inpatient Hospital Stay (HOSPITAL_COMMUNITY): Payer: Medicare Other | Admitting: Certified Registered"

## 2016-08-22 DIAGNOSIS — Z87891 Personal history of nicotine dependence: Secondary | ICD-10-CM

## 2016-08-22 DIAGNOSIS — Z79899 Other long term (current) drug therapy: Secondary | ICD-10-CM | POA: Diagnosis not present

## 2016-08-22 DIAGNOSIS — M1710 Unilateral primary osteoarthritis, unspecified knee: Secondary | ICD-10-CM | POA: Diagnosis present

## 2016-08-22 DIAGNOSIS — Z8774 Personal history of (corrected) congenital malformations of heart and circulatory system: Secondary | ICD-10-CM | POA: Diagnosis not present

## 2016-08-22 DIAGNOSIS — I6529 Occlusion and stenosis of unspecified carotid artery: Secondary | ICD-10-CM | POA: Diagnosis present

## 2016-08-22 DIAGNOSIS — Z7982 Long term (current) use of aspirin: Secondary | ICD-10-CM

## 2016-08-22 DIAGNOSIS — Z8673 Personal history of transient ischemic attack (TIA), and cerebral infarction without residual deficits: Secondary | ICD-10-CM

## 2016-08-22 DIAGNOSIS — M1712 Unilateral primary osteoarthritis, left knee: Principal | ICD-10-CM | POA: Diagnosis present

## 2016-08-22 DIAGNOSIS — Z888 Allergy status to other drugs, medicaments and biological substances status: Secondary | ICD-10-CM | POA: Diagnosis not present

## 2016-08-22 DIAGNOSIS — Q211 Atrial septal defect: Secondary | ICD-10-CM

## 2016-08-22 DIAGNOSIS — G8918 Other acute postprocedural pain: Secondary | ICD-10-CM | POA: Diagnosis not present

## 2016-08-22 HISTORY — PX: TOTAL KNEE ARTHROPLASTY: SHX125

## 2016-08-22 SURGERY — ARTHROPLASTY, KNEE, TOTAL
Anesthesia: Spinal | Site: Knee | Laterality: Left

## 2016-08-22 MED ORDER — CHLORHEXIDINE GLUCONATE 4 % EX LIQD: 60.0000 mL | Freq: Once | CUTANEOUS | Status: DC

## 2016-08-22 MED ORDER — BUPIVACAINE LIPOSOME 1.3 % IJ SUSP
INTRAMUSCULAR | Status: DC | PRN
  Administered 2016-08-22: 20 mL

## 2016-08-22 MED ORDER — PHENYLEPHRINE 40 MCG/ML (10ML) SYRINGE FOR IV PUSH (FOR BLOOD PRESSURE SUPPORT)
PREFILLED_SYRINGE | INTRAVENOUS | Status: AC
  Filled 2016-08-22: qty 10

## 2016-08-22 MED ORDER — SODIUM CHLORIDE 0.9 % IR SOLN
Status: DC | PRN
  Administered 2016-08-22 (×2): 3000 mL

## 2016-08-22 MED ORDER — ROCURONIUM BROMIDE 10 MG/ML (PF) SYRINGE
PREFILLED_SYRINGE | INTRAVENOUS | Status: AC
  Filled 2016-08-22: qty 5

## 2016-08-22 MED ORDER — CLONIDINE HCL (ANALGESIA) 100 MCG/ML EP SOLN
EPIDURAL | Status: DC | PRN
  Administered 2016-08-22: 1 mL

## 2016-08-22 MED ORDER — BUPIVACAINE-EPINEPHRINE (PF) 0.5% -1:200000 IJ SOLN
INTRAMUSCULAR | Status: DC | PRN
  Administered 2016-08-22: 30 mL via PERINEURAL

## 2016-08-22 MED ORDER — LACTATED RINGERS IV SOLN
INTRAVENOUS | Status: DC | PRN
  Administered 2016-08-22 (×3): via INTRAVENOUS

## 2016-08-22 MED ORDER — CLONIDINE HCL (ANALGESIA) 100 MCG/ML EP SOLN
150.0000 ug | Freq: Once | EPIDURAL | Status: DC
  Filled 2016-08-22: qty 1.5

## 2016-08-22 MED ORDER — RIVAROXABAN 10 MG PO TABS
10.0000 mg | ORAL_TABLET | Freq: Every day | ORAL | Status: DC
  Administered 2016-08-23 – 2016-08-25 (×3): 10 mg via ORAL
  Filled 2016-08-22 (×3): qty 1

## 2016-08-22 MED ORDER — SODIUM CHLORIDE 0.9% FLUSH
INTRAVENOUS | Status: DC | PRN
  Administered 2016-08-22: 20 mL

## 2016-08-22 MED ORDER — FENTANYL CITRATE (PF) 100 MCG/2ML IJ SOLN
INTRAMUSCULAR | Status: AC
  Administered 2016-08-22: 50 ug via INTRAVENOUS
  Filled 2016-08-22: qty 2

## 2016-08-22 MED ORDER — SIMVASTATIN 20 MG PO TABS: 20.0000 mg | ORAL_TABLET | Freq: Every day | ORAL | Status: DC

## 2016-08-22 MED ORDER — METOCLOPRAMIDE HCL 5 MG PO TABS
5.0000 mg | ORAL_TABLET | Freq: Three times a day (TID) | ORAL | Status: DC | PRN
Start: 2016-08-22 — End: 2016-08-25

## 2016-08-22 MED ORDER — PHENYLEPHRINE HCL 10 MG/ML IJ SOLN
INTRAMUSCULAR | Status: DC | PRN
  Administered 2016-08-22 (×2): 200 ug via INTRAVENOUS
  Administered 2016-08-22 (×3): 120 ug via INTRAVENOUS
  Administered 2016-08-22: 80 ug via INTRAVENOUS

## 2016-08-22 MED ORDER — MORPHINE SULFATE (PF) 4 MG/ML IV SOLN
INTRAVENOUS | Status: DC | PRN
  Administered 2016-08-22: 8 mg

## 2016-08-22 MED ORDER — B COMPLEX PO TABS: 1.0000 | ORAL_TABLET | Freq: Every evening | ORAL | Status: DC

## 2016-08-22 MED ORDER — PHENYLEPHRINE HCL 10 MG/ML IJ SOLN
INTRAVENOUS | Status: DC | PRN
  Administered 2016-08-22: 30 ug/min via INTRAVENOUS

## 2016-08-22 MED ORDER — MORPHINE SULFATE (PF) 4 MG/ML IV SOLN
INTRAVENOUS | Status: AC
  Filled 2016-08-22: qty 2

## 2016-08-22 MED ORDER — MORPHINE SULFATE (PF) 4 MG/ML IV SOLN
2.0000 mg | INTRAVENOUS | Status: DC | PRN
  Administered 2016-08-22 – 2016-08-23 (×5): 2 mg via INTRAVENOUS
  Filled 2016-08-22 (×5): qty 1

## 2016-08-22 MED ORDER — ACETAMINOPHEN 325 MG PO TABS
650.0000 mg | ORAL_TABLET | Freq: Four times a day (QID) | ORAL | Status: DC | PRN
  Administered 2016-08-23: 650 mg via ORAL
  Filled 2016-08-22: qty 2

## 2016-08-22 MED ORDER — PROPOFOL 500 MG/50ML IV EMUL
INTRAVENOUS | Status: DC | PRN
  Administered 2016-08-22: 14:00:00 via INTRAVENOUS
  Administered 2016-08-22: 75 ug/kg/min via INTRAVENOUS

## 2016-08-22 MED ORDER — TRANEXAMIC ACID 1000 MG/10ML IV SOLN
2000.0000 mg | Freq: Once | INTRAVENOUS | Status: DC
  Filled 2016-08-22: qty 20

## 2016-08-22 MED ORDER — MIDAZOLAM HCL 2 MG/2ML IJ SOLN
INTRAMUSCULAR | Status: AC
  Administered 2016-08-22: 1 mg via INTRAVENOUS
  Filled 2016-08-22: qty 2

## 2016-08-22 MED ORDER — BUPIVACAINE HCL (PF) 0.25 % IJ SOLN
INTRAMUSCULAR | Status: AC
  Filled 2016-08-22: qty 30

## 2016-08-22 MED ORDER — BUPIVACAINE-EPINEPHRINE (PF) 0.25% -1:200000 IJ SOLN
INTRAMUSCULAR | Status: AC
  Filled 2016-08-22: qty 30

## 2016-08-22 MED ORDER — POTASSIUM CHLORIDE IN NACL 20-0.9 MEQ/L-% IV SOLN
INTRAVENOUS | Status: AC
  Administered 2016-08-23: 01:00:00 via INTRAVENOUS
  Filled 2016-08-22: qty 1000

## 2016-08-22 MED ORDER — METHOCARBAMOL 1000 MG/10ML IJ SOLN
500.0000 mg | Freq: Four times a day (QID) | INTRAVENOUS | Status: DC | PRN
  Filled 2016-08-22: qty 5

## 2016-08-22 MED ORDER — METOCLOPRAMIDE HCL 5 MG/ML IJ SOLN
5.0000 mg | Freq: Three times a day (TID) | INTRAMUSCULAR | Status: DC | PRN
Start: 2016-08-22 — End: 2016-08-25

## 2016-08-22 MED ORDER — 0.9 % SODIUM CHLORIDE (POUR BTL) OPTIME
TOPICAL | Status: DC | PRN
Start: 2016-08-22 — End: 2016-08-22
  Administered 2016-08-22 (×3): 1000 mL

## 2016-08-22 MED ORDER — PROMETHAZINE HCL 25 MG/ML IJ SOLN: 6.2500 mg | INTRAMUSCULAR | Status: DC | PRN

## 2016-08-22 MED ORDER — MEPERIDINE HCL 25 MG/ML IJ SOLN
6.2500 mg | INTRAMUSCULAR | Status: DC | PRN
Start: 2016-08-22 — End: 2016-08-22

## 2016-08-22 MED ORDER — LIDOCAINE HCL (CARDIAC) 20 MG/ML IV SOLN
INTRAVENOUS | Status: DC | PRN
  Administered 2016-08-22: 100 mg via INTRATRACHEAL

## 2016-08-22 MED ORDER — KETOROLAC TROMETHAMINE 15 MG/ML IJ SOLN: 15.0000 mg | Freq: Once | INTRAMUSCULAR | Status: DC

## 2016-08-22 MED ORDER — ONDANSETRON HCL 4 MG/2ML IJ SOLN: 4.0000 mg | Freq: Four times a day (QID) | INTRAMUSCULAR | Status: DC | PRN

## 2016-08-22 MED ORDER — CEFAZOLIN SODIUM-DEXTROSE 2-4 GM/100ML-% IV SOLN
2.0000 g | Freq: Four times a day (QID) | INTRAVENOUS | Status: AC
  Administered 2016-08-22 – 2016-08-23 (×2): 2 g via INTRAVENOUS
  Filled 2016-08-22 (×2): qty 100

## 2016-08-22 MED ORDER — TRANEXAMIC ACID 1000 MG/10ML IV SOLN
INTRAVENOUS | Status: AC | PRN
  Administered 2016-08-22: 2000 mg via TOPICAL

## 2016-08-22 MED ORDER — ACETAMINOPHEN 650 MG RE SUPP: 650.0000 mg | Freq: Four times a day (QID) | RECTAL | Status: DC | PRN

## 2016-08-22 MED ORDER — ONDANSETRON HCL 4 MG PO TABS: 4.0000 mg | ORAL_TABLET | Freq: Four times a day (QID) | ORAL | Status: DC | PRN

## 2016-08-22 MED ORDER — MENTHOL 3 MG MT LOZG: 1.0000 | LOZENGE | OROMUCOSAL | Status: DC | PRN

## 2016-08-22 MED ORDER — PROPOFOL 10 MG/ML IV BOLUS
INTRAVENOUS | Status: AC
  Filled 2016-08-22: qty 20

## 2016-08-22 MED ORDER — LACTATED RINGERS IV SOLN
Freq: Once | INTRAVENOUS | Status: AC
  Administered 2016-08-22: 11:00:00 via INTRAVENOUS

## 2016-08-22 MED ORDER — PROPOFOL 10 MG/ML IV BOLUS
INTRAVENOUS | Status: DC | PRN
  Administered 2016-08-22: 50 mg via INTRAVENOUS
  Administered 2016-08-22: 30 mg via INTRAVENOUS

## 2016-08-22 MED ORDER — HYDROMORPHONE HCL 1 MG/ML IJ SOLN: 0.2500 mg | INTRAMUSCULAR | Status: DC | PRN

## 2016-08-22 MED ORDER — MIDAZOLAM HCL 2 MG/2ML IJ SOLN
1.0000 mg | Freq: Once | INTRAMUSCULAR | Status: AC
  Administered 2016-08-22: 1 mg via INTRAVENOUS

## 2016-08-22 MED ORDER — BUPIVACAINE IN DEXTROSE 0.75-8.25 % IT SOLN
INTRATHECAL | Status: DC | PRN
  Administered 2016-08-22: 1.8 mL via INTRATHECAL

## 2016-08-22 MED ORDER — BUPIVACAINE-EPINEPHRINE (PF) 0.25% -1:200000 IJ SOLN
INTRAMUSCULAR | Status: DC | PRN
  Administered 2016-08-22: 20 mL via PERINEURAL

## 2016-08-22 MED ORDER — FENTANYL CITRATE (PF) 250 MCG/5ML IJ SOLN
INTRAMUSCULAR | Status: AC
  Filled 2016-08-22: qty 5

## 2016-08-22 MED ORDER — METHOCARBAMOL 500 MG PO TABS
500.0000 mg | ORAL_TABLET | Freq: Four times a day (QID) | ORAL | Status: DC | PRN
  Administered 2016-08-22 – 2016-08-25 (×6): 500 mg via ORAL
  Filled 2016-08-22 (×6): qty 1

## 2016-08-22 MED ORDER — GABAPENTIN 300 MG PO CAPS
300.0000 mg | ORAL_CAPSULE | Freq: Three times a day (TID) | ORAL | Status: DC
  Administered 2016-08-22 – 2016-08-25 (×9): 300 mg via ORAL
  Filled 2016-08-22 (×9): qty 1

## 2016-08-22 MED ORDER — PHENOL 1.4 % MT LIQD: 1.0000 | OROMUCOSAL | Status: DC | PRN

## 2016-08-22 MED ORDER — OXYCODONE HCL 5 MG PO TABS
5.0000 mg | ORAL_TABLET | ORAL | Status: DC | PRN
  Administered 2016-08-22 – 2016-08-25 (×14): 10 mg via ORAL
  Filled 2016-08-22 (×14): qty 2

## 2016-08-22 MED ORDER — BUPIVACAINE HCL (PF) 0.25 % IJ SOLN
INTRAMUSCULAR | Status: DC | PRN
  Administered 2016-08-22: 20 mL

## 2016-08-22 MED ORDER — BUPIVACAINE LIPOSOME 1.3 % IJ SUSP
20.0000 mL | INTRAMUSCULAR | Status: DC
  Filled 2016-08-22: qty 20

## 2016-08-22 MED ORDER — FENTANYL CITRATE (PF) 100 MCG/2ML IJ SOLN
50.0000 ug | Freq: Once | INTRAMUSCULAR | Status: AC
  Administered 2016-08-22: 50 ug via INTRAVENOUS

## 2016-08-22 MED ORDER — CEFAZOLIN SODIUM-DEXTROSE 2-4 GM/100ML-% IV SOLN
2.0000 g | INTRAVENOUS | Status: AC
  Administered 2016-08-22: 2 g via INTRAVENOUS
  Filled 2016-08-22: qty 100

## 2016-08-22 SURGICAL SUPPLY — 76 items
BANDAGE ACE 4X5 VEL STRL LF (GAUZE/BANDAGES/DRESSINGS) ×1 IMPLANT
BANDAGE ESMARK 6X9 LF (GAUZE/BANDAGES/DRESSINGS) ×1 IMPLANT
BLADE SAG 18X100X1.27 (BLADE) ×3 IMPLANT
BLADE SAW SGTL 13.0X1.19X90.0M (BLADE) ×2 IMPLANT
BNDG CMPR 9X6 STRL LF SNTH (GAUZE/BANDAGES/DRESSINGS) ×1
BNDG CMPR MED 15X6 ELC VLCR LF (GAUZE/BANDAGES/DRESSINGS) ×1
BNDG COHESIVE 6X5 TAN STRL LF (GAUZE/BANDAGES/DRESSINGS) ×3 IMPLANT
BNDG ELASTIC 6X15 VLCR STRL LF (GAUZE/BANDAGES/DRESSINGS) ×5 IMPLANT
BNDG ESMARK 6X9 LF (GAUZE/BANDAGES/DRESSINGS) ×3
BOWL SMART MIX CTS (DISPOSABLE) IMPLANT
CAPT KNEE TRIATH TK-4 ×2 IMPLANT
CLOSURE WOUND 1/2 X4 (GAUZE/BANDAGES/DRESSINGS) ×1
CONT SPEC 4OZ CLIKSEAL STRL BL (MISCELLANEOUS) ×2 IMPLANT
COVER SURGICAL LIGHT HANDLE (MISCELLANEOUS) ×3 IMPLANT
CUFF TOURNIQUET SINGLE 34IN LL (TOURNIQUET CUFF) ×3 IMPLANT
CUFF TOURNIQUET SINGLE 44IN (TOURNIQUET CUFF) IMPLANT
DECANTER SPIKE VIAL GLASS SM (MISCELLANEOUS) ×3 IMPLANT
DRAPE INCISE IOBAN 66X45 STRL (DRAPES) IMPLANT
DRAPE ORTHO SPLIT 77X108 STRL (DRAPES) ×6
DRAPE SURG ORHT 6 SPLT 77X108 (DRAPES) ×3 IMPLANT
DRAPE U-SHAPE 47X51 STRL (DRAPES) ×3 IMPLANT
DRSG AQUACEL AG ADV 3.5X10 (GAUZE/BANDAGES/DRESSINGS) ×3 IMPLANT
DRSG AQUACEL AG ADV 3.5X14 (GAUZE/BANDAGES/DRESSINGS) ×2 IMPLANT
DRSG PAD ABDOMINAL 8X10 ST (GAUZE/BANDAGES/DRESSINGS) ×2 IMPLANT
DURAPREP 26ML APPLICATOR (WOUND CARE) ×6 IMPLANT
ELECT CAUTERY BLADE 6.4 (BLADE) ×3 IMPLANT
ELECT REM PT RETURN 9FT ADLT (ELECTROSURGICAL) ×3
ELECTRODE REM PT RTRN 9FT ADLT (ELECTROSURGICAL) ×1 IMPLANT
GAUZE SPONGE 4X4 12PLY STRL (GAUZE/BANDAGES/DRESSINGS) ×1 IMPLANT
GLOVE BIOGEL PI IND STRL 7.5 (GLOVE) ×1 IMPLANT
GLOVE BIOGEL PI IND STRL 8 (GLOVE) ×1 IMPLANT
GLOVE BIOGEL PI INDICATOR 7.5 (GLOVE) ×2
GLOVE BIOGEL PI INDICATOR 8 (GLOVE) ×2
GLOVE ECLIPSE 7.0 STRL STRAW (GLOVE) ×3 IMPLANT
GLOVE SURG ORTHO 8.0 STRL STRW (GLOVE) ×3 IMPLANT
GOWN STRL REUS W/ TWL LRG LVL3 (GOWN DISPOSABLE) ×3 IMPLANT
GOWN STRL REUS W/TWL LRG LVL3 (GOWN DISPOSABLE) ×12
HANDPIECE INTERPULSE COAX TIP (DISPOSABLE) ×3
HOOD PEEL AWAY FLYTE STAYCOOL (MISCELLANEOUS) ×11 IMPLANT
IMMOBILIZER KNEE 20 (SOFTGOODS) IMPLANT
IMMOBILIZER KNEE 22 UNIV (SOFTGOODS) ×2 IMPLANT
IMMOBILIZER KNEE 24 THIGH 36 (MISCELLANEOUS) IMPLANT
IMMOBILIZER KNEE 24 UNIV (MISCELLANEOUS)
KIT BASIN OR (CUSTOM PROCEDURE TRAY) ×3 IMPLANT
KIT ROOM TURNOVER OR (KITS) ×3 IMPLANT
MANIFOLD NEPTUNE II (INSTRUMENTS) ×3 IMPLANT
NDL SAFETY ECLIPSE 18X1.5 (NEEDLE) ×1 IMPLANT
NDL SPNL 18GX3.5 QUINCKE PK (NEEDLE) ×1 IMPLANT
NEEDLE HYPO 18GX1.5 SHARP (NEEDLE)
NEEDLE HYPO 22GX1.5 SAFETY (NEEDLE) ×3 IMPLANT
NEEDLE SPNL 18GX3.5 QUINCKE PK (NEEDLE) ×3 IMPLANT
NS IRRIG 1000ML POUR BTL (IV SOLUTION) ×6 IMPLANT
PACK TOTAL JOINT (CUSTOM PROCEDURE TRAY) ×3 IMPLANT
PAD ABD 8X10 STRL (GAUZE/BANDAGES/DRESSINGS) ×2 IMPLANT
PAD ARMBOARD 7.5X6 YLW CONV (MISCELLANEOUS) ×4 IMPLANT
PAD CAST 4YDX4 CTTN HI CHSV (CAST SUPPLIES) ×1 IMPLANT
PADDING CAST COTTON 4X4 STRL (CAST SUPPLIES) ×3
PADDING CAST COTTON 6X4 STRL (CAST SUPPLIES) ×3 IMPLANT
SET HNDPC FAN SPRY TIP SCT (DISPOSABLE) IMPLANT
SPONGE LAP 18X18 X RAY DECT (DISPOSABLE) IMPLANT
STRIP CLOSURE SKIN 1/2X4 (GAUZE/BANDAGES/DRESSINGS) ×3 IMPLANT
SUCTION FRAZIER HANDLE 10FR (MISCELLANEOUS) ×2
SUCTION TUBE FRAZIER 10FR DISP (MISCELLANEOUS) ×1 IMPLANT
SUT MNCRL AB 3-0 PS2 18 (SUTURE) ×3 IMPLANT
SUT VIC AB 0 CT1 27 (SUTURE) ×15
SUT VIC AB 0 CT1 27XBRD ANBCTR (SUTURE) ×3 IMPLANT
SUT VIC AB 1 CT1 27 (SUTURE) ×15
SUT VIC AB 1 CT1 27XBRD ANBCTR (SUTURE) ×5 IMPLANT
SUT VIC AB 2-0 CT1 27 (SUTURE) ×15
SUT VIC AB 2-0 CT1 TAPERPNT 27 (SUTURE) ×4 IMPLANT
SYR 30ML LL (SYRINGE) ×9 IMPLANT
SYR TB 1ML LUER SLIP (SYRINGE) ×1 IMPLANT
TOWEL OR 17X24 6PK STRL BLUE (TOWEL DISPOSABLE) ×4 IMPLANT
TOWEL OR 17X26 10 PK STRL BLUE (TOWEL DISPOSABLE) ×4 IMPLANT
TRAY CATH 16FR W/PLASTIC CATH (SET/KITS/TRAYS/PACK) IMPLANT
WATER STERILE IRR 1000ML POUR (IV SOLUTION) IMPLANT

## 2016-08-22 NOTE — Anesthesia Postprocedure Evaluation (Deleted)
Anesthesia Post Note  Patient: Robert Mclaughlin  Procedure(s) Performed: Procedure(s) (LRB): LEFT TOTAL KNEE ARTHROPLASTY (Left)     Anesthesia Post Evaluation  Last Vitals:  Vitals:   08/22/16 1616 08/22/16 1624  BP: 94/63   Pulse: 62   Resp: 12   Temp:  36.5 C    Last Pain:  Vitals:   08/22/16 1601  TempSrc:   PainSc: 0-No pain   Pain Goal: Patients Stated Pain Goal: 4 (08/22/16 1040)               [place]

## 2016-08-22 NOTE — Transfer of Care (Signed)
Immediate Anesthesia Transfer of Care Note  Patient: Robert Mclaughlin  Procedure(s) Performed: Procedure(s): LEFT TOTAL KNEE ARTHROPLASTY (Left)  Patient Location: PACU  Anesthesia Type:MAC and MAC combined with regional for post-op pain  Level of Consciousness: awake, alert , oriented and patient cooperative  Airway & Oxygen Therapy: Patient Spontanous Breathing  Post-op Assessment: Report given to RN and Post -op Vital signs reviewed and stable  Post vital signs: Reviewed and stable  Last Vitals:  Vitals:   08/22/16 1125 08/22/16 1130  BP: 108/70 101/70  Pulse: 67 71  Resp: 19 (!) 23  Temp:      Last Pain:  Vitals:   08/22/16 1130  TempSrc:   PainSc: 0-No pain      Patients Stated Pain Goal: 4 (12/75/17 0017)  Complications: No apparent anesthesia complications

## 2016-08-22 NOTE — H&P (Signed)
TOTAL KNEE ADMISSION H&P  Patient is being admitted for left total knee arthroplasty.  Subjective:  Chief Complaint:left knee pain.  HPI: Robert Mclaughlin, 71 y.o. male, has a history of pain and functional disability in the left knee due to arthritis and has failed non-surgical conservative treatments for greater than 12 weeks to includeNSAID's and/or analgesics, corticosteriod injections, viscosupplementation injections, flexibility and strengthening excercises and activity modification.  Onset of symptoms was gradual, starting >10 years ago with gradually worsening course since that time. The patient noted no past surgery on the left knee(s).  Patient currently rates pain in the left knee(s) at 9 out of 10 with activity. Patient has night pain, worsening of pain with activity and weight bearing, pain that interferes with activities of daily living, pain with passive range of motion, crepitus and joint swelling.  Patient has evidence of subchondral sclerosis and joint space narrowing by imaging studies. This patient has had Recent PFO closure with the result.  He has had no recent episodes of TIA or chest pain or shortness of breath.. There is no active infection.  Patient Active Problem List   Diagnosis Date Noted  . Primary osteoarthritis of left knee 04/28/2016  . PFO (patent foramen ovale) 04/11/2016  . Occlusion and stenosis of carotid artery without mention of cerebral infarction 07/24/2011   Past Medical History:  Diagnosis Date  . Arthritis    "left knee" (04/11/2016)  . Carotid artery occlusion   . Dysrhythmia    iiregular heart beat yrs ago  . Migraine equivalent    "none in years" (04/11/2016)  . PFO (patent foramen ovale)    diagnosed 04/2006  . Stroke [facility]) 2008   denies residual on 04/11/2016    Past Surgical History:  Procedure Laterality Date  . MASS EXCISION Right 2017   "precancerous growth on my leg"  . MOUTH SURGERY     tooth extraction and root extraction   .  PATENT FORAMEN OVALE CLOSURE  04/11/2016  . PATENT FORAMEN OVALE CLOSURE N/A 04/11/2016   Procedure: Patent [place](PFO) Closure;  Surgeon: Sherren Mocha, MD;  Location: [place] [facility];  Service: Cardiovascular;  Laterality: N/A;  . TONSILLECTOMY    . TOOTH EXTRACTION      Prescriptions Prior to Admission  Medication Sig Dispense Refill Last Dose  . amoxicillin (AMOXIL) 500 MG capsule Take 4 capsules (2000 mg) by mouth 1 hour prior to dental procedure. 4 capsule 2 Past Month at Unknown time  . ARTIFICIAL TEAR OP Place 1 drop into both eyes daily.    08/21/2016 at Unknown time  . aspirin EC 81 MG tablet Take 81 mg by mouth daily.   08/21/2016 at Unknown time  . B Complex Vitamins (B COMPLEX PO) Take 1 tablet by mouth every evening.    Past Week at Unknown time  . diclofenac sodium (VOLTAREN) 1 % GEL Apply 1 application topically 4 (four) times daily as needed (muscle pain). (Patient taking differently: Apply 1 application topically 4 (four) times daily as needed (for pain.). ) 1 Tube 3 Past Month at Unknown time  . Melatonin 3 MG TABS Take 3 mg by mouth at bedtime.   Past Month at Unknown time  . simvastatin (ZOCOR) 20 MG tablet Take 20 mg by mouth at bedtime.    Past Month at Unknown time  . acetaminophen-codeine (TYLENOL #3) 300-30 MG tablet Take 1 tablet by mouth every 6 (six) hours as needed for moderate pain. (Patient not taking: Reported on 08/09/2016)  30 tablet 0 Not Taking at Unknown time   Allergies  Allergen Reactions  . Latanoprost Other (See Comments)    Ineffective   . Lumigan [Bimatoprost] Other (See Comments)    Ineffective    Social History  Substance Use Topics  . Smoking status: Former Smoker    Packs/day: 1.00    Years: 6.00    Quit date: 03/13/1969  . Smokeless tobacco: Never Used  . Alcohol use 8.4 oz/week    14 Cans of beer per week     Comment: 2 drinks per day    Family History  Problem Relation Age of Onset  . Heart disease Mother        Heart  Disease before age 66  . Varicose Veins Mother   . Hyperlipidemia Father      Review of Systems  Musculoskeletal: Positive for joint pain.  All other systems reviewed and are negative.   Objective:  Physical Exam  Constitutional: He appears well-developed.  HENT:  Head: Normocephalic.  Eyes: Pupils are equal, round, and reactive to light.  Neck: Normal range of motion.  Cardiovascular: Normal rate.   Respiratory: Effort normal.  Neurological: He is alert.  Skin: Skin is warm.  Psychiatric: He has a normal mood and affect.  Examination of the left knee demonstrates intact skin with good range of motion.  He has about a 7 flexion contracture.  Extensor mechanism is intact.  Collateral crucial ligaments are stable.  Pedal pulses palpable.  There's no groin pain with internal/external rotation of the leg.  Vital signs in last 24 hours: Temp:  [98.1 F (36.7 C)] 98.1 F (36.7 C) (06/12 1023) Pulse Rate:  [67-104] 71 (06/12 1130) Resp:  [15-23] 23 (06/12 1130) BP: (101-137)/(70-96) 101/70 (06/12 1130) SpO2:  [88 %-99 %] 99 % (06/12 1130) Weight:  [183 lb 9.6 oz (83.3 kg)] 183 lb 9.6 oz (83.3 kg) (06/12 1023)  Labs:   Estimated body mass index is 26.34 kg/m as calculated from the following:   Height as of this encounter: 5\' 10"  (1.778 m).   Weight as of this encounter: 183 lb 9.6 oz (83.3 kg).   Imaging Review Plain radiographs demonstrate moderate degenerative joint disease of the left knee(s). The overall alignment ismild varus. The bone quality appears to be good for age and reported activity level.  Assessment/Plan:  End stage arthritis, left knee   The patient history, physical examination, clinical judgment of the provider and imaging studies are consistent with end stage degenerative joint disease of the left knee(s) and total knee arthroplasty is deemed medically necessary. The treatment options including medical management, injection therapy arthroscopy and  arthroplasty were discussed at length. The risks and benefits of total knee arthroplasty were presented and reviewed. The risks due to aseptic loosening, infection, stiffness, patella tracking problems, thromboembolic complications and other imponderables were discussed. The patient acknowledged the explanation, agreed to proceed with the plan and consent was signed. Patient is being admitted for inpatient treatment for surgery, pain control, PT, OT, prophylactic antibiotics, VTE prophylaxis, progressive ambulation and ADL's and discharge planning. The patient is planning to be discharged home with home health services

## 2016-08-22 NOTE — Anesthesia Postprocedure Evaluation (Signed)
Anesthesia Post Note  Patient: Robert Mclaughlin  Procedure(s) Performed: Procedure(s) (LRB): LEFT TOTAL KNEE ARTHROPLASTY (Left)     Patient location during evaluation: PACU Anesthesia Type: Spinal Level of consciousness: awake Pain management: pain level controlled Vital Signs Assessment: post-procedure vital signs reviewed and stable Respiratory status: spontaneous breathing Cardiovascular status: stable Postop Assessment: no headache, no backache, spinal receding, no signs of nausea or vomiting and patient able to bend at knees Anesthetic complications: no    Last Vitals:  Vitals:   08/22/16 1616 08/22/16 1624  BP: 94/63   Pulse: 62   Resp: 12   Temp:  36.5 C    Last Pain:  Vitals:   08/22/16 1601  TempSrc:   PainSc: 0-No pain   Pain Goal: Patients Stated Pain Goal: 4 (08/22/16 1040)               [place]

## 2016-08-22 NOTE — Anesthesia Preprocedure Evaluation (Addendum)
Anesthesia Evaluation  Patient identified by MRN, date of birth, ID band Patient awake    Reviewed: Allergy & Precautions, NPO status , Patient's Chart, lab work & pertinent test results  Airway Mallampati: I       Dental no notable dental hx. (+) Teeth Intact   Pulmonary former smoker,    Pulmonary exam normal breath sounds clear to auscultation       Cardiovascular Normal cardiovascular exam Rhythm:Regular Rate:Normal     Neuro/Psych negative psych ROS   GI/Hepatic negative GI ROS, Neg liver ROS,   Endo/Other  negative endocrine ROS  Renal/GU negative Renal ROS  negative genitourinary   Musculoskeletal   Abdominal Normal abdominal exam  (+)   Peds negative pediatric ROS (+)  Hematology negative hematology ROS (+)   Anesthesia Other Findings ECHO LIMITED WO IMAGE ENHANCING AGENT W COLOR & SPECT  Order# [id number]  Reading physician: Thayer Headings, MD Ordering physician: Sherren Mocha, MD Study date: 04/12/16 Study Result   Result status: Edited Result - FINAL                             *[facility]*                         [address], [place] [address]                            [phone]  ------------------------------------------------------------------- Transthoracic Echocardiography  (Report amended )  Patient:    Kerron, Sedano MR #:       [id number] Study Date: 04/12/2016 Gender:     M Age:        71 Height:     180.3 cm Weight:     84 kg BSA:        2.06 m^2 Pt. Status: Room:       [place]  Diamond Nickel  ORDERING     Sherren Mocha, MD  PERFORMING   [facility], Inpatient  cc:  ------------------------------------------------------------------- LV EF: 60% -   65%     Reproductive/Obstetrics                            Anesthesia Physical Anesthesia  Plan  ASA: II  Anesthesia Plan: Spinal   Post-op Pain Management:    Induction:   PONV Risk Score and Plan: 1 and Ondansetron, Dexamethasone and Treatment may vary due to age or medical condition  Airway Management Planned: Natural Airway, Simple Face Mask and Nasal Cannula  Additional Equipment:   Intra-op Plan:   Post-operative Plan:   Informed Consent: I have reviewed the patients History and Physical, chart, labs and discussed the procedure including the risks, benefits and alternatives for the proposed anesthesia with the patient or authorized representative who has indicated his/her understanding and acceptance.     Plan Discussed with: CRNA and Surgeon  Anesthesia Plan Comments:         Anesthesia Quick Evaluation

## 2016-08-22 NOTE — Anesthesia Procedure Notes (Signed)
Procedure Name: MAC Date/Time: 08/22/2016 12:32 PM Performed by: Lance Coon Pre-anesthesia Checklist: Patient identified, Emergency Drugs available, Suction available, Patient being monitored and Timeout performed Patient Re-evaluated:Patient Re-evaluated prior to inductionOxygen Delivery Method: Nasal cannula

## 2016-08-22 NOTE — Brief Op Note (Signed)
08/22/2016  3:16 PM  PATIENT:  Robert Mclaughlin  71 y.o. male  PRE-OPERATIVE DIAGNOSIS:  left knee osteoarthritis   POST-OPERATIVE DIAGNOSIS:  left knee osteoarthritis   PROCEDURE:  Procedure(s): LEFT TOTAL KNEE ARTHROPLASTY  SURGEON:  Surgeon(s): Meredith Pel, MD  ASSISTANT: Laure Kidney rnfa  ANESTHESIA:   spinal  EBL: 150 ml    Total I/O In: 2000 [I.V.:2000] Out: -   BLOOD ADMINISTERED: none  DRAINS: none   LOCAL MEDICATIONS USED:  none  SPECIMEN:  No Specimen  COUNTS:  YES  TOURNIQUET:   Total Tourniquet Time Documented: Thigh (Left) - 84 minutes Total: Thigh (Left) - 84 minutes   DICTATION: [id number] PLAN OF CARE: Admit to inpatient   PATIENT DISPOSITION:  PACU - hemodynamically stable

## 2016-08-22 NOTE — Anesthesia Procedure Notes (Signed)
Spinal  Start time: 08/22/2016 12:38 PM End time: 08/22/2016 12:41 PM Staffing Anesthesiologist: Lyn Hollingshead Performed: anesthesiologist  Preanesthetic Checklist Completed: patient identified, site marked, surgical consent, pre-op evaluation, timeout performed, IV checked, risks and benefits discussed and monitors and equipment checked Spinal Block Patient position: sitting Prep: DuraPrep Patient monitoring: heart rate, cardiac monitor, continuous pulse ox and blood pressure Location: L3-4 Injection technique: single-shot Needle Needle type: Pencan  Needle gauge: 24 G Needle length: 10 cm Needle insertion depth: 5 cm Assessment Sensory level: T8

## 2016-08-22 NOTE — Progress Notes (Signed)
Orthopedic Tech Progress Note Patient Details:  Robert Mclaughlin [birth year] [id number]  CPM Left Knee CPM Left Knee: On Left Knee Flexion (Degrees): 40 Left Knee Extension (Degrees): 0  Ortho Devices Ortho Device/Splint Location: footsie roll Ortho Device/Splint Interventions: Ordered, Application   Braulio Bosch 08/22/2016, 3:58 PM

## 2016-08-22 NOTE — Anesthesia Procedure Notes (Addendum)
Anesthesia Regional Block: Adductor canal block   Pre-Anesthetic Checklist: ,, timeout performed, Correct Patient, Correct Site, Correct Laterality, Correct Procedure, Correct Position, site marked, Risks and benefits discussed,  Surgical consent,  Pre-op evaluation,  At surgeon's request and post-op pain management  Laterality: Left and Upper  Prep: chloraprep       Needles:  Injection technique: Single-shot  Needle Type: Echogenic Stimulator Needle     Needle Length: 9cm  Needle Gauge: 21   Needle insertion depth: 3 cm   Additional Needles:   Procedures: ultrasound guided,,,,,,,,  Narrative:  Start time: 08/22/2016 11:15 AM End time: 08/22/2016 11:25 AM Injection made incrementally with aspirations every 5 mL.  Performed by: Personally  Anesthesiologist: Lyn Hollingshead

## 2016-08-22 NOTE — Op Note (Signed)
NAME:  NYLES, MITTON NO.:  [id number]  MEDICAL RECORD NO.:  [id number]  LOCATION:  [facility]                         FACILITY:  [facility] [facility]  PHYSICIAN:  Anderson Malta, M.D.    DATE OF BIRTH:  1946-01-07  DATE OF PROCEDURE: DATE OF DISCHARGE:                              OPERATIVE REPORT   PREOPERATIVE DIAGNOSIS:  Left knee arthritis.  POSTOPERATIVE DIAGNOSIS:  Left knee arthritis.  PROCEDURE:  Left total knee replacement.  SURGEON:  Anderson Malta, M.D.  ASSISTANT:  None.  ANESTHESIA:  Spinal.  INDICATIONS:  The patient is a 71 year old patient with significant end- stage left knee arthritis, who has failed conservative management and wishes to proceed with operative management after explanation of risks and benefits.  DESCRIPTION OF PROCEDURE:  The patient was brought to the operating room where spinal anesthetic was induced.  Preoperative antibiotics administered.  Time-out was called.  Left leg was prescrubbed with alcohol and Betadine and allowed to air dry.  Prepped with DuraPrep solution and draped in sterile manner.  Preoperatively, the patient had about a 15-degree flexion contracture, but had full flexion and varus alignment.  The leg was prepped with DuraPrep solution.  Charlie Pitter was used to cover the operative field.  Leg was elevated and exsanguinated with the Esmarch wrap.  Tourniquet inflated to 300 mmHg.  The anterior approach to the knee was made.  Skin and subcutaneous tissues were sharply divided.  Median parapatellar approach was made and marked with a #1 Vicryl suture.  Lateral patellofemoral ligament released.  Fat pad partially excised.  Soft tissue removed in the anterior distal femur. Bone quality was excellent.  Decision was made at that time to perform cruciate retaining press-fit knee replacement.  The initial tibial cut made perpendicular to the mechanical axis, was 9 off the least affected lateral tibial plateau.  This was later revised  to take 2 more mm in order to achieve full extension.  In a similar manner, 8 mm cut was made off the distal femur, which was then augmented to 10 mm.  This allowed a 9 mm spacer to be placed in full extension and good alignment. Following cuts on the femur and tibia, the chamfer cuts were made on the femur.  The patella was then cut from 26 down to 15 and then trial reduction was performed.  With a size 5 femur, 6 tibia, and 9 mm insert, the patient had full extension, full flexion, excellent patellar tracking using no thumbs technique along with no liftoff.  Trial components were removed.  Thorough irrigation performed.  A solution of Exparel, Marcaine, and saline injected into the capsular region.  Skin edges then anesthetized with plain Marcaine, morphine, and clonidine. The incision was then soaked with tranexamic acid soaked sponge for 3 minutes.  This was then removed.  Pouring irrigation performed and then the true components were press-fit into good position.  Stryker triathlon 5 femur, cruciate retaining along with 6 tibia with 11 mm insert and a 35 mm press-fit patella was placed.  Excellent range of motion and patellar tracking were achieved.  Tourniquet released. Bleeding points were encountered and controlled with electrocautery. Two more liters of pouring irrigation utilized and  the incision was closed using #1 Vicryl to reapproximate the arthrotomy, 0 Vicryl, 2-0 Vicryl, and 3-0 Monocryl.  Aquacel dressing plus a bulky dressing and knee immobilizer placed.  The patient tolerated the procedure well without immediate complications, transferred to the recovery room in stable condition.     Anderson Malta, M.D.     GSD/MEDQ  D:  08/22/2016  T:  08/22/2016  Job:  [id number]

## 2016-08-23 ENCOUNTER — Encounter (HOSPITAL_COMMUNITY): Payer: Self-pay | Admitting: Orthopedic Surgery

## 2016-08-23 NOTE — Care Management Note (Signed)
Case Management Note  Patient Details  Name: Robert Mclaughlin MRN: [id number] Date of Birth: [birth year]  Subjective/Objective:    S/p L TKA                Action/Plan: Discharge Planning: NCM spoke to pt and wife at bedside. Offered choice for HH/list provided. Pt agreeable to [facility]. Requesting RW and 3n1 bedside commode. Wife states they have CPM at home that was delivered by [employer]. Contacted [facility] DME rep for RW and 3n1. Will need HHPT orders with [facility]. Message left for attending.   PCP ROSS, Dwyane Luo MD  Expected Discharge Date:  08/24/2016            Expected Discharge Plan:  [place]  In-House Referral:  NA  Discharge planning Services  CM Consult  Post Acute Care Choice:  Home Health Choice offered to:  Spouse  DME Arranged:  3-N-1, Walker rolling, CPM DME Agency:  [employer], [place]:  PT [place] Agency:  [facility] (formerly [facility])  Status of Service:  Completed, signed off  If discussed at [employer] of Stay Meetings, dates discussed:    Additional Comments:  Erenest Rasher, RN 08/23/2016, 3:31 PM

## 2016-08-23 NOTE — Progress Notes (Signed)
Paged Dr. Marlou Sa to notify that patient requesting bowel regimen and  I also wanted to know if MD wanted to order any blood work for today(METB or CBC).  Waiting for call back

## 2016-08-23 NOTE — Discharge Instructions (Signed)

## 2016-08-23 NOTE — Evaluation (Signed)
Physical Therapy Evaluation Patient Details Name: Robert Mclaughlin MRN: [id number] DOB: [birth year] Today's Date: 08/23/2016   History of Present Illness  Pt is a 71 yo male with c/o of L knee pain secondary to end stage knee OA. s/p L TKA 08/22/16. PMH significant for Occlusion and stenosis of carotid artery without mention of cerebral infarction, and patent foramen ovale.    Clinical Impression  Pt is s/p TKA resulting in the deficits listed below (see PT Problem List). Pt is currently, minA for bed mobility, transfers and ambulation of 200 feet with RW. Pt is very determined to quickly return to prior independence, however as a result he does not verbalize changes in pain level despite be asked repeatedly. This increase in pain limited his ability to move his knee after ambulation. Pt will benefit from skilled PT to increase their independence and safety with mobility to allow discharge to the venue listed below.      Follow Up Recommendations Home health PT;Supervision/Assistance - 24 hour    Equipment Recommendations  3in1 (PT);Rolling walker with 5" wheels    Recommendations for Other Services OT consult     Precautions / Restrictions Precautions Precautions: Knee Required Braces or Orthoses: Knee Immobilizer - Left Restrictions Weight Bearing Restrictions: Yes LLE Weight Bearing: Weight bearing as tolerated      Mobility  Bed Mobility Overal bed mobility: Needs Assistance Bed Mobility: Supine to Sit     Supine to sit: Min assist     General bed mobility comments: minA for management of LE to floor vc for using bed rail to pull up and for bringing trunk to upright   Transfers Overall transfer level: Needs assistance Equipment used: Standard walker Transfers: Sit to/from Stand Sit to Stand: Min assist         General transfer comment: minA for steadying in upright vc for hand placement on walker   Ambulation/Gait Ambulation/Gait assistance: Min  assist Ambulation Distance (Feet): 200 Feet Assistive device: Rolling walker (2 wheeled) Gait Pattern/deviations: Step-to pattern;Decreased stance time - left;Decreased weight shift to left;Antalgic Gait velocity: slowed Gait velocity interpretation: Below normal speed for age/gender General Gait Details: minA for steadying vc for walker sequencing and for keeping eyes open. Pt wanting to walk long distance and was safe with gait, however once back in room could not tolerate movement of his leg at all to place bone foam, or pillow to maintain leg in extension as he was literally shaking with pain.       Balance Overall balance assessment: Needs assistance Sitting-balance support: No upper extremity supported;Feet supported Sitting balance-Leahy Scale: Good     Standing balance support: Bilateral upper extremity supported;During functional activity;No upper extremity supported Standing balance-Leahy Scale: Fair Standing balance comment: able to urinate and wash hands with no upper extremity support                             Pertinent Vitals/Pain Pain Assessment: 0-10 Pain Score: 7  Pain Location: L knee  Pain Descriptors / Indicators: Constant;Aching;Sharp;Shooting Pain Intervention(s): Monitored during session;Limited activity within patient's tolerance;Repositioned  VSS    Home Living Family/patient expects to be discharged to:: Private residence Living Arrangements: Spouse/significant other Available Help at Discharge: Family;Available 24 hours/day Type of Home: House Home Access: Stairs to enter Entrance Stairs-Rails: None Entrance Stairs-Number of Steps: 2 Home Layout: Two level Home Equipment: Cane - single point;Hand held shower head      Prior Function  Level of Independence: Independent         Comments: community ambulator and driver         Extremity/Trunk Assessment   Upper Extremity Assessment Upper Extremity Assessment: Defer to OT  evaluation    Lower Extremity Assessment Lower Extremity Assessment: LLE deficits/detail LLE Deficits / Details: L TKA limiting ROM and strength in hip knee and ankle. LLE: Unable to fully assess due to pain;Unable to fully assess due to immobilization    Cervical / Trunk Assessment Cervical / Trunk Assessment: Normal  Communication   Communication: No difficulties  Cognition Arousal/Alertness: Awake/alert Behavior During Therapy: WFL for tasks assessed/performed Overall Cognitive Status: Within Functional Limits for tasks assessed                                        General Comments General comments (skin integrity, edema, etc.): Pt wife present at end of session    Exercises Total Joint Exercises Ankle Circles/Pumps: AROM;Both;10 reps;Seated   Assessment/Plan    PT Assessment Patient needs continued PT services  PT Problem List Decreased strength;Decreased range of motion;Decreased activity tolerance;Decreased balance;Decreased mobility;Decreased knowledge of use of DME;Decreased safety awareness;Decreased knowledge of precautions;Pain       PT Treatment Interventions DME instruction;Gait training;Stair training;Functional mobility training;Therapeutic activities;Therapeutic exercise;Balance training;Patient/family education    PT Goals (Current goals can be found in the Care Plan section)  Acute Rehab PT Goals Patient Stated Goal: go home PT Goal Formulation: With patient Time For Goal Achievement: 08/30/16 Potential to Achieve Goals: Good    Frequency 7X/week   Barriers to discharge Inaccessible home environment         AM-PAC PT "6 Clicks" Daily Activity  Outcome Measure Difficulty turning over in bed (including adjusting bedclothes, sheets and blankets)?: A Lot Difficulty moving from lying on back to sitting on the side of the bed? : A Lot Difficulty sitting down on and standing up from a chair with arms (e.g., wheelchair, bedside commode,  etc,.)?: A Little Help needed moving to and from a bed to chair (including a wheelchair)?: None Help needed walking in hospital room?: None Help needed climbing 3-5 steps with a railing? : A Lot 6 Click Score: 17    End of Session Equipment Utilized During Treatment: Gait belt;Left knee immobilizer Activity Tolerance: Patient limited by pain Patient left: in chair;with call bell/phone within reach;with family/visitor present Nurse Communication: Mobility status;Patient requests pain meds;Precautions;Weight bearing status PT Visit Diagnosis: Other abnormalities of gait and mobility (R26.89);Muscle weakness (generalized) (M62.81);Difficulty in walking, not elsewhere classified (R26.2);Pain Pain - Right/Left: Left Pain - part of body: Knee    Time: 0802-0918 PT Time Calculation (min) (ACUTE ONLY): 76 min   Charges:   PT Evaluation $PT Eval Low Complexity: 1 Procedure PT Treatments $Gait Training: 38-52 mins $Therapeutic Activity: 8-22 mins   PT G Codes:        Rannie Craney B. Migdalia Dk PT, DPT Acute Rehabilitation  [phone] Pager [phone]    [place] 08/23/2016, 11:45 AM

## 2016-08-23 NOTE — Evaluation (Signed)
Occupational Therapy Evaluation Patient Details Name: Robert Mclaughlin MRN: [id number] DOB: 1945/06/07 Today's Date: 08/23/2016    History of Present Illness Pt is a 71 yo male with c/o of L knee pain secondary to end stage knee OA. s/p L TKA 08/22/16. PMH significant for Occlusion and stenosis of carotid artery without mention of cerebral infarction, and patent foramen ovale.     Clinical Impression   PTA, pt was living with his wife and was independent. Currently, pt requires Min A for ADLs and functional mobility using RW. Provided education on LB ADLs, toilet transfer, and tub transfer with 3N1; pt demonstrated understanding. Pt would benefit from further acute OT to increase his safety and independence with ADLs and functional mobility. Recommend dc home once medically stable per physician.     Follow Up Recommendations  DC plan and follow up therapy as arranged by surgeon;Supervision/Assistance - 24 hour    Equipment Recommendations  3 in 1 bedside commode    Recommendations for Other Services PT consult     Precautions / Restrictions Precautions Precautions: Knee Precaution Comments: Reviewed resting with knee in extension Required Braces or Orthoses: Knee Immobilizer - Left Restrictions Weight Bearing Restrictions: Yes LLE Weight Bearing: Weight bearing as tolerated      Mobility Bed Mobility Overal bed mobility: Needs Assistance Bed Mobility: Supine to Sit     Supine to sit: Min assist     General bed mobility comments: In recliner upon arrival  Transfers Overall transfer level: Needs assistance Equipment used: Standard walker Transfers: Sit to/from Stand Sit to Stand: Min assist         General transfer comment: minA for steadying in upright vc for hand placement on walker     Balance Overall balance assessment: Needs assistance Sitting-balance support: No upper extremity supported;Feet supported Sitting balance-Leahy Scale: Good     Standing  balance support: Bilateral upper extremity supported;During functional activity;No upper extremity supported Standing balance-Leahy Scale: Fair Standing balance comment: Able to perform LB dressing with Min A for dynamic balance                           ADL either performed or assessed with clinical judgement   ADL Overall ADL's : Needs assistance/impaired Eating/Feeding: Set up;Sitting   Grooming: Set up;Sitting   Upper Body Bathing: Set up;Sitting   Lower Body Bathing: Minimal assistance;Sit to/from stand   Upper Body Dressing : Set up;Sitting   Lower Body Dressing: Minimal assistance;Sit to/from stand Lower Body Dressing Details (indicate cue type and reason): Pt donned underwear and pants with Min A to get over L foot. Min A to maintain standing balance while pulling pants over hips.  Toilet Transfer: Minimal assistance;Ambulation;BSC;RW       Tub/ Shower Transfer: Walk-in shower;Minimal assistance;Cueing for sequencing;Ambulation;3 in 1;Rolling walker Tub/Shower Transfer Details (indicate cue type and reason): Pt demonstrates good understanding and verbalized technique while performing t/f. However, requires Min A for balance due to decreased ROM in L knee and increased pain.  Functional mobility during ADLs: Min guard;Minimal assistance;Rolling walker General ADL Comments: Pt demonstrates decreased fucntional performance and requires Min A throughout session. Pt also lethargic during session, so provided education to wife who verbalized good understanding     Vision         Perception     Praxis      Pertinent Vitals/Pain Pain Assessment: Faces Faces Pain Scale: Hurts whole lot Pain Location: L knee  Pain  Descriptors / Indicators: Constant;Aching;Sharp;Shooting Pain Intervention(s): Monitored during session;Limited activity within patient's tolerance;Repositioned;Ice applied     Hand Dominance Right   Extremity/Trunk Assessment Upper Extremity  Assessment Upper Extremity Assessment: Overall WFL for tasks assessed   Lower Extremity Assessment Lower Extremity Assessment: Defer to PT evaluation LLE Deficits / Details: L TKA limiting ROM and strength in hip knee and ankle. LLE: Unable to fully assess due to pain;Unable to fully assess due to immobilization   Cervical / Trunk Assessment Cervical / Trunk Assessment: Normal   Communication Communication Communication: No difficulties   Cognition Arousal/Alertness: Lethargic;Suspect due to medications Behavior During Therapy: [facility] for tasks assessed/performed Overall Cognitive Status: Within Functional Limits for tasks assessed                                 General Comments: Pt with drowsiness throughout session but very willing to particiapte in therapy.    General Comments  Pt wife present throughout session. Provided her with education    Exercises     Shoulder Instructions      Home Living Family/patient expects to be discharged to:: Private residence Living Arrangements: Spouse/significant other Available Help at Discharge: Family;Available 24 hours/day Type of Home: House Home Access: Stairs to enter [employer] of Steps: 2 Entrance Stairs-Rails: None Home Layout: Two level Alternate Level Stairs-Number of Steps: 18 Alternate Level Stairs-Rails: Left Bathroom Shower/Tub: Occupational psychologist: Standard Bathroom Accessibility: Yes   Home Equipment: Cane - single point;Hand held shower head          Prior Functioning/Environment Level of Independence: Independent        Comments: ADLs, IADLs, driving, and enjoys golf        OT Problem List: Decreased strength;Decreased range of motion;Decreased activity tolerance;Impaired balance (sitting and/or standing);Decreased safety awareness;Decreased knowledge of use of DME or AE;Decreased knowledge of precautions;Pain      OT Treatment/Interventions: Self-care/ADL  training;Therapeutic exercise;Energy conservation;DME and/or AE instruction;Therapeutic activities;Patient/family education    OT Goals(Current goals can be found in the care plan section) Acute Rehab OT Goals Patient Stated Goal: go home OT Goal Formulation: With patient Time For Goal Achievement: 09/06/16 Potential to Achieve Goals: Good ADL Goals Pt Will Perform Grooming: with set-up;with supervision;standing Pt Will Perform Lower Body Dressing: with set-up;with supervision;with caregiver independent in assisting;sit to/from stand Pt Will Transfer to Toilet: with set-up;with supervision;bedside commode;ambulating Pt Will Perform Tub/Shower Transfer: with min guard assist;ambulating;3 in 1;rolling walker;Shower transfer  OT Frequency: Min 2X/week   Barriers to D/C:            Co-evaluation              AM-PAC PT "6 Clicks" Daily Activity     Outcome Measure Help from another person eating meals?: None Help from another person taking care of personal grooming?: A Little Help from another person toileting, which includes using toliet, bedpan, or urinal?: A Little Help from another person bathing (including washing, rinsing, drying)?: A Little Help from another person to put on and taking off regular upper body clothing?: None Help from another person to put on and taking off regular lower body clothing?: A Little 6 Click Score: 20   End of Session Equipment Utilized During Treatment: Gait belt;Rolling walker;Left knee immobilizer CPM Left Knee CPM Left Knee: On Nurse Communication: Mobility status;Patient requests pain meds;Weight bearing status  Activity Tolerance: Patient tolerated treatment well;Patient limited by lethargy;Patient limited  by pain Patient left: in chair;with call bell/phone within reach;with family/visitor present  OT Visit Diagnosis: Other abnormalities of gait and mobility (R26.89);Unsteadiness on feet (R26.81);Muscle weakness (generalized)  (M62.81);Pain Pain - Right/Left: Left Pain - part of body: Knee                Time: 2583-4621 OT Time Calculation (min): 31 min Charges:  OT General Charges $OT Visit: 1 Procedure OT Evaluation $OT Eval Low Complexity: 1 Procedure OT Treatments $Self Care/Home Management : 8-22 mins G-Codes:     Ariannah Arenson MSOT, OTR/L Acute Rehab Pager: [phone] Office: [place] 08/23/2016, 12:54 PM

## 2016-08-23 NOTE — Progress Notes (Signed)
Subjective: Pt stable - pain ok   Objective: Vital signs in last 24 hours: Temp:  [97.6 F (36.4 C)-98.6 F (37 C)] 98.6 F (37 C) (06/13 0422) Pulse Rate:  [58-104] 83 (06/13 0422) Resp:  [12-23] 18 (06/13 0422) BP: (90-137)/(63-96) 118/72 (06/13 0422) SpO2:  [88 %-100 %] 98 % (06/13 0422) Weight:  [183 lb 9.6 oz (83.3 kg)] 183 lb 9.6 oz (83.3 kg) (06/12 1023)  Intake/Output from previous day: 06/12 0701 - 06/13 0700 In: 2600 [P.O.:300; I.V.:2200; IV Piggyback:100] Out: 1700 [Urine:1650; Blood:50] Intake/Output this shift: No intake/output data recorded.  Exam:  Sensation intact distally Intact pulses distally Dorsiflexion/Plantar flexion intact  Labs: No results for input(s): HGB in the last 72 hours. No results for input(s): WBC, RBC, HCT, PLT in the last 72 hours. No results for input(s): NA, K, CL, CO2, BUN, CREATININE, GLUCOSE, CALCIUM in the last 72 hours. No results for input(s): LABPT, INR in the last 72 hours.  Assessment/Plan: Plan pt and cpm today  - on xarelto    G Scott Treina [place] 08/23/2016, 7:23 AM

## 2016-08-23 NOTE — Progress Notes (Signed)
Physical Therapy Treatment Patient Details Name: Robert Mclaughlin MRN: [id number] DOB: 19-Jun-1945 Today's Date: 08/23/2016    History of Present Illness Pt is a 71 yo male with c/o of L knee pain secondary to end stage knee OA. s/p L TKA 08/22/16. PMH significant for Occlusion and stenosis of carotid artery without mention of cerebral infarction, and patent foramen ovale.      PT Comments    Pt performed decreased gait due to pain but able to tolerate supine exercises.  Pt reports pain throughout tx.  Placed in CPM 0-75 degrees post session.  CPM to be removed at 18:30.  Plan next session for stair training.     Follow Up Recommendations  Home health PT;Supervision/Assistance - 24 hour     Equipment Recommendations  3in1 (PT);Rolling walker with 5" wheels    Recommendations for Other Services       Precautions / Restrictions Precautions Precautions: Knee Precaution Comments: Reviewed resting with knee in extension Required Braces or Orthoses: Knee Immobilizer - Left Restrictions Weight Bearing Restrictions: Yes LLE Weight Bearing: Weight bearing as tolerated    Mobility  Bed Mobility Overal bed mobility: Needs Assistance Bed Mobility: Sit to Supine       Sit to supine: Min assist   General bed mobility comments: Pt required assist to lift LLE against gravity. Pt guarded due to pain.    Transfers Overall transfer level: Needs assistance Equipment used: Rolling walker (2 wheeled) Transfers: Sit to/from Stand Sit to Stand: Min assist         General transfer comment: Cues for hand placement to and from seated surface.    Ambulation/Gait Ambulation/Gait assistance: Min assist Ambulation Distance (Feet): 80 Feet Assistive device: Rolling walker (2 wheeled) Gait Pattern/deviations: Step-to pattern;Trunk flexed;Antalgic;Decreased stance time - left;Decreased step length - right Gait velocity: slowed   General Gait Details: Cues for L heel strike and knee  extension in stance phase.  Pt with difficulty keeping his eyes open.  Decreased gait distance performed due to pain.     Stairs            Wheelchair Mobility    Modified Rankin (Stroke Patients Only)       Balance Overall balance assessment: Needs assistance Sitting-balance support: No upper extremity supported;Feet supported Sitting balance-Leahy Scale: Good     Standing balance support: Bilateral upper extremity supported;During functional activity;No upper extremity supported Standing balance-Leahy Scale: Fair                              Cognition Arousal/Alertness: Lethargic;Suspect due to medications Behavior During Therapy: [facility] for tasks assessed/performed Overall Cognitive Status: Within Functional Limits for tasks assessed                                 General Comments: Pt required constant cues for redirection as he has difficulty keeping his eyes.        Exercises Total Joint Exercises Ankle Circles/Pumps: AROM;Both;10 reps;Supine Quad Sets: AROM;Left;10 reps;Supine Towel Squeeze: AROM;Both;10 reps;Supine Short Arc Quad: Left;10 reps;Supine;AAROM Heel Slides: AAROM;Left;10 reps;Supine Hip ABduction/ADduction: Left;10 reps;Supine;AAROM Straight Leg Raises: AAROM;Left;10 reps;Supine Goniometric ROM: 69 degrees flexion in L knee.    General Comments        Pertinent Vitals/Pain Pain Assessment: Faces Pain Score: 7  Pain Location: L knee  Pain Descriptors / Indicators: Constant;Aching;Sharp;Shooting Pain Intervention(s): Monitored during session;Repositioned  Home Living Family/patient expects to be discharged to:: Private residence Living Arrangements: Spouse/significant other                  Prior Function            PT Goals (current goals can now be found in the care plan section) Acute Rehab PT Goals Patient Stated Goal: go home Potential to Achieve Goals: Fair Progress towards PT goals:  Progressing toward goals    Frequency    7X/week      PT Plan Current plan remains appropriate    Co-evaluation              AM-PAC PT "6 Clicks" Daily Activity  Outcome Measure  Difficulty turning over in bed (including adjusting bedclothes, sheets and blankets)?: A Lot Difficulty moving from lying on back to sitting on the side of the bed? : A Lot Difficulty sitting down on and standing up from a chair with arms (e.g., wheelchair, bedside commode, etc,.)?: A Lot Help needed moving to and from a bed to chair (including a wheelchair)?: A Little Help needed walking in hospital room?: A Little Help needed climbing 3-5 steps with a railing? : A Lot 6 Click Score: 14    End of Session Equipment Utilized During Treatment: Gait belt Activity Tolerance: Patient limited by pain Patient left: with call bell/phone within reach;with family/visitor present;in bed Nurse Communication: Mobility status;Patient requests pain meds;Precautions;Weight bearing status PT Visit Diagnosis: Other abnormalities of gait and mobility (R26.89);Muscle weakness (generalized) (M62.81);Difficulty in walking, not elsewhere classified (R26.2);Pain Pain - Right/Left: Left Pain - part of body: Knee     Time: 2336-1224 PT Time Calculation (min) (ACUTE ONLY): 30 min  Charges:  $Gait Training: 8-22 mins $Therapeutic Exercise: 8-22 mins                    G Codes:       Governor Rooks, PTA pager [phone]    Cristela Blue 08/23/2016, 4:48 PM

## 2016-08-24 MED ORDER — DOCUSATE SODIUM 100 MG PO CAPS
100.0000 mg | ORAL_CAPSULE | Freq: Two times a day (BID) | ORAL | Status: DC
  Administered 2016-08-24 – 2016-08-25 (×2): 100 mg via ORAL
  Filled 2016-08-24 (×2): qty 1

## 2016-08-24 MED ORDER — SENNA 8.6 MG PO TABS
1.0000 | ORAL_TABLET | Freq: Every day | ORAL | Status: DC
  Administered 2016-08-24: 8.6 mg via ORAL
  Filled 2016-08-24: qty 1

## 2016-08-24 NOTE — Progress Notes (Signed)
Subjective: Patient doing well.  Pain control.  CPM at 48.   Objective: Vital signs in last 24 hours: Temp:  [98.4 F (36.9 C)-99.4 F (37.4 C)] 98.9 F (37.2 C) (06/14 0429) Pulse Rate:  [83-101] 94 (06/14 0429) Resp:  [18] 18 (06/14 0429) BP: (106-110)/(64-68) 106/64 (06/14 0429) SpO2:  [90 %-94 %] 94 % (06/14 0429)  Intake/Output from previous day: 06/13 0701 - 06/14 0700 In: 795 [P.O.:720; I.V.:75] Out: 1200 [Urine:1200] Intake/Output this shift: Total I/O In: 240 [P.O.:240] Out: -   Exam:  Sensation intact distally Intact pulses distally Dorsiflexion/Plantar flexion intact  Labs: No results for input(s): HGB in the last 72 hours. No results for input(s): WBC, RBC, HCT, PLT in the last 72 hours. No results for input(s): NA, K, CL, CO2, BUN, CREATININE, GLUCOSE, CALCIUM in the last 72 hours. No results for input(s): LABPT, INR in the last 72 hours.  Assessment/Plan: Plan at this time is to continue with physical therapy and CPM machine.  Okay for discharge to home tomorrow.   Landry Dyke Dean 08/24/2016, 1:31 PM

## 2016-08-24 NOTE — Progress Notes (Signed)
Physical Therapy Treatment Patient Details Name: Robert Mclaughlin MRN: [id number] DOB: 08-22-1945 Today's Date: 08/24/2016    History of Present Illness Pt is a 71 yo male with c/o of L knee pain secondary to end stage knee OA. s/p L TKA 08/22/16. PMH significant for Occlusion and stenosis of carotid artery without mention of cerebral infarction, and patent foramen ovale.      PT Comments    Pt performed increased activity and present with increased knee flexion during session this am.  Pt tolerated increased gait trial.  Will f/u in pm for stair training.     Follow Up Recommendations  Home health PT;Supervision/Assistance - 24 hour     Equipment Recommendations  3in1 (PT);Rolling walker with 5" wheels    Recommendations for Other Services       Precautions / Restrictions Precautions Precautions: Knee Precaution Comments: Reviewed resting with knee in extension Restrictions Weight Bearing Restrictions: Yes LLE Weight Bearing: Weight bearing as tolerated    Mobility  Bed Mobility Overal bed mobility: Needs Assistance Bed Mobility: Supine to Sit     Supine to sit: Supervision     General bed mobility comments: Pt required increased time but did not require assistance.    Transfers Overall transfer level: Needs assistance Equipment used: Rolling walker (2 wheeled) Transfers: Sit to/from Stand Sit to Stand: Min guard         General transfer comment: Cues for hand placement to and from seated surface.  Cues to advance LLE forward to improve ease and decrease pain.    Ambulation/Gait Ambulation/Gait assistance: Min guard Ambulation Distance (Feet): 150 Feet Assistive device: Rolling walker (2 wheeled) Gait Pattern/deviations: Step-through pattern;Antalgic;Trunk flexed;Decreased stance time - left;Decreased step length - right Gait velocity: slowed Gait velocity interpretation: Below normal speed for age/gender General Gait Details: Cues for L heel strike and  and knee extension in stance phase.  Pt remains with antalgic gait but able to increase gait distance with better pain control.     Stairs            Wheelchair Mobility    Modified Rankin (Stroke Patients Only)       Balance Overall balance assessment: Needs assistance Sitting-balance support: Bilateral upper extremity supported Sitting balance-Leahy Scale: Fair       Standing balance-Leahy Scale: Fair                              Cognition Arousal/Alertness: Awake/alert Behavior During Therapy: WFL for tasks assessed/performed Overall Cognitive Status: Within Functional Limits for tasks assessed                                 General Comments: Pt more alert and awake today.        Exercises Total Joint Exercises Ankle Circles/Pumps: AROM;Both;10 reps;Supine Quad Sets: AROM;Left;10 reps;Supine Towel Squeeze: AROM;Both;10 reps;Supine Short Arc Quad: Left;10 reps;Supine;AAROM Heel Slides: AAROM;Left;10 reps;Supine Hip ABduction/ADduction: Left;10 reps;Supine;AAROM Straight Leg Raises: AAROM;Left;10 reps;Supine Goniometric ROM: 86 degrees L knee flexion.      General Comments        Pertinent Vitals/Pain Pain Assessment: 0-10 Pain Score: 7  Pain Location: L knee  Pain Descriptors / Indicators: Constant;Aching;Sharp;Shooting Pain Intervention(s): Monitored during session;Repositioned    Home Living                      Prior Function  PT Goals (current goals can now be found in the care plan section) Acute Rehab PT Goals Patient Stated Goal: go home Potential to Achieve Goals: Fair Progress towards PT goals: Progressing toward goals    Frequency    7X/week      PT Plan Current plan remains appropriate    Co-evaluation              AM-PAC PT "6 Clicks" Daily Activity  Outcome Measure  Difficulty turning over in bed (including adjusting bedclothes, sheets and blankets)?: A  Lot Difficulty moving from lying on back to sitting on the side of the bed? : A Lot Difficulty sitting down on and standing up from a chair with arms (e.g., wheelchair, bedside commode, etc,.)?: A Lot Help needed moving to and from a bed to chair (including a wheelchair)?: A Little Help needed walking in hospital room?: A Little Help needed climbing 3-5 steps with a railing? : A Little 6 Click Score: 15    End of Session Equipment Utilized During Treatment: Gait belt Activity Tolerance: Patient limited by pain Patient left: with call bell/phone within reach;with family/visitor present;in bed Nurse Communication: Mobility status;Patient requests pain meds;Precautions;Weight bearing status PT Visit Diagnosis: Other abnormalities of gait and mobility (R26.89);Muscle weakness (generalized) (M62.81);Difficulty in walking, not elsewhere classified (R26.2);Pain Pain - Right/Left: Left Pain - part of body: Knee     Time: 0921-0959 PT Time Calculation (min) (ACUTE ONLY): 38 min  Charges:  $Gait Training: 8-22 mins $Therapeutic Exercise: 8-22 mins $Therapeutic Activity: 8-22 mins                    G Codes:       Governor Rooks, PTA pager [phone]    Cristela Blue 08/24/2016, 9:59 AM

## 2016-08-24 NOTE — Progress Notes (Signed)
Occupational Therapy Treatment Patient Details Name: Robert Mclaughlin MRN: [id number] DOB: 12/29/1945 Today's Date: 08/24/2016    History of present illness Pt is a 71 yo male with c/o of L knee pain secondary to end stage knee OA. s/p L TKA 08/22/16. PMH significant for Occlusion and stenosis of carotid artery without mention of cerebral infarction, and patent foramen ovale.     OT comments  Pt progressing towards acute OT goals. Focus of session was LB dressing and toilet transfer. Spouse present for session. D/c plan remains appropriate.   Follow Up Recommendations  DC plan and follow up therapy as arranged by surgeon;Supervision/Assistance - 24 hour    Equipment Recommendations  3 in 1 bedside commode    Recommendations for Other Services      Precautions / Restrictions Precautions Precautions: Knee Precaution Comments: Reviewed resting with knee in extension Required Braces or Orthoses: Knee Immobilizer - Left Restrictions Weight Bearing Restrictions: Yes LLE Weight Bearing: Weight bearing as tolerated       Mobility Bed Mobility Overal bed mobility: Needs Assistance Bed Mobility: Supine to Sit     Supine to sit: Supervision     General bed mobility comments: up in chair  Transfers Overall transfer level: Needs assistance Equipment used: Rolling walker (2 wheeled) Transfers: Sit to/from Stand Sit to Stand: Min guard         General transfer comment: Cues for hand placement to and from seated surface.  Cues to advance LLE forward to improve ease and decrease pain.      Balance Overall balance assessment: Needs assistance Sitting-balance support: Bilateral upper extremity supported Sitting balance-Leahy Scale: Fair     Standing balance support: Bilateral upper extremity supported;During functional activity;No upper extremity supported Standing balance-Leahy Scale: Poor Standing balance comment: 1 LOB while donning LB dressing around waist, min A from  therapist to prevent a fall. Educated pt and spouse on safety with LB ADLs.                            ADL either performed or assessed with clinical judgement   ADL Overall ADL's : Needs assistance/impaired                     Lower Body Dressing: Minimal assistance;Sit to/from stand Lower Body Dressing Details (indicate cue type and reason): Assist to start LB dressing onto L foot and for correcting balance while completing task in standing with 1 LOB. Toilet Transfer: Min guard;Ambulation;BSC;RW           Functional mobility during ADLs: Min guard;Rolling walker General ADL Comments: Pt completed LB dressing and toilet transfer as detailed above. Spouse present and included in education.      Vision       Perception     Praxis      Cognition Arousal/Alertness: Awake/alert Behavior During Therapy: WFL for tasks assessed/performed Overall Cognitive Status: Within Functional Limits for tasks assessed                                 General Comments: Pt more alert and awake today.          Exercises Total Joint Exercises Ankle Circles/Pumps: AROM;Both;10 reps;Supine Quad Sets: AROM;Left;10 reps;Supine Towel Squeeze: AROM;Both;10 reps;Supine Short Arc Quad: Left;10 reps;Supine;AAROM Heel Slides: AAROM;Left;10 reps;Supine Hip ABduction/ADduction: Left;10 reps;Supine;AAROM Straight Leg Raises: AAROM;Left;10 reps;Supine Goniometric ROM: 86 degrees L  knee flexion.     Shoulder Instructions       General Comments      Pertinent Vitals/ Pain       Pain Assessment: Faces Pain Score: 7  Faces Pain Scale: Hurts even more Pain Location: L knee with movement Pain Descriptors / Indicators: Constant;Aching;Sharp;Shooting Pain Intervention(s): Monitored during session;Repositioned  Home Living                                          Prior Functioning/Environment              Frequency  Min 2X/week         Progress Toward Goals  OT Goals(current goals can now be found in the care plan section)  Progress towards OT goals: Progressing toward goals  Acute Rehab OT Goals Patient Stated Goal: go home OT Goal Formulation: With patient Time For Goal Achievement: 09/06/16 Potential to Achieve Goals: Good ADL Goals Pt Will Perform Grooming: with set-up;with supervision;standing Pt Will Perform Lower Body Dressing: with set-up;with supervision;with caregiver independent in assisting;sit to/from stand Pt Will Transfer to Toilet: with set-up;with supervision;bedside commode;ambulating Pt Will Perform Tub/Shower Transfer: with min guard assist;ambulating;3 in 1;rolling walker;Shower transfer  Plan Discharge plan remains appropriate    Co-evaluation                 AM-PAC PT "6 Clicks" Daily Activity     Outcome Measure                    End of Session Equipment Utilized During Treatment: Rolling walker  OT Visit Diagnosis: Other abnormalities of gait and mobility (R26.89);Unsteadiness on feet (R26.81);Muscle weakness (generalized) (M62.81);Pain Pain - Right/Left: Left Pain - part of body: Knee   Activity Tolerance Patient tolerated treatment well;Patient limited by pain   Patient Left in chair;with call bell/phone within reach;with family/visitor present   Nurse Communication          Time: 2800-3491 OT Time Calculation (min): 23 min  Charges: OT General Charges $OT Visit: 1 Procedure OT Treatments $Self Care/Home Management : 23-37 mins     Hortencia Pilar 08/24/2016, 1:39 PM

## 2016-08-24 NOTE — Progress Notes (Signed)
Physical Therapy Treatment Patient Details Name: Robert Mclaughlin MRN: [id number] DOB: 09-07-1945 Today's Date: 08/24/2016    History of Present Illness Pt is a 71 yo male with c/o of L knee pain secondary to end stage knee OA. s/p L TKA 08/22/16. PMH significant for Occlusion and stenosis of carotid artery without mention of cerebral infarction, and patent foramen ovale.      PT Comments    Pt performed increased gait and functional mobility.  Pt educated and assisted in stair negotiation.  Pt will benefit from review of stair training and HEP.  Increased edema and redness around knee incision.  Pt to d/c home in am.     Follow Up Recommendations  Home health PT;Supervision/Assistance - 24 hour     Equipment Recommendations  3in1 (PT);Rolling walker with 5" wheels    Recommendations for Other Services OT consult     Precautions / Restrictions Precautions Precautions: Knee Precaution Comments: Reviewed resting with knee in extension Required Braces or Orthoses: Knee Immobilizer - Left Restrictions Weight Bearing Restrictions: Yes LLE Weight Bearing: Weight bearing as tolerated    Mobility  Bed Mobility Overal bed mobility: Needs Assistance Bed Mobility: Supine to Sit;Sit to Supine     Supine to sit: Supervision Sit to supine: Min assist   General bed mobility comments: Pt required assistance to elevate LLE back into bed against gravity. Pt able to advance to edge of bed without assistance.  Pt required increased time secondary to pain.    Transfers Overall transfer level: Needs assistance Equipment used: Rolling walker (2 wheeled) Transfers: Sit to/from Stand Sit to Stand: Min guard         General transfer comment: Cues for hand placement to and from seated surface.  Cues to advance LLE forward to improve ease and decrease pain.    Ambulation/Gait Ambulation/Gait assistance: Min guard Ambulation Distance (Feet):  (150 ft x2 trials) Assistive device: Rolling  walker (2 wheeled) Gait Pattern/deviations: Step-through pattern;Antalgic;Trunk flexed;Decreased stance time - left;Decreased step length - right Gait velocity: slowed Gait velocity interpretation: Below normal speed for age/gender General Gait Details: Cues for L heel strike and and knee extension in stance phase.  Pt remains with antalgic gait but able to increase gait distance with better pain control.     Stairs Stairs: Yes   Stair Management: No rails;Backwards;With walker Number of Stairs: 5 General stair comments: Pt attempted forward with L rail but he is unable to ascend due to pain.  pt then required use of RW backwards to offset pain and allow for UE use for stair negotiation.   Cues for sequencing and RW placement.    Wheelchair Mobility    Modified Rankin (Stroke Patients Only)       Balance Overall balance assessment: Needs assistance Sitting-balance support: Bilateral upper extremity supported Sitting balance-Leahy Scale: Fair     Standing balance support: Bilateral upper extremity supported;During functional activity;No upper extremity supported Standing balance-Leahy Scale: Poor Standing balance comment: 1 LOB while donning LB dressing around waist, min A from therapist to prevent a fall. Educated pt and spouse on safety with LB ADLs.                             Cognition Arousal/Alertness: Awake/alert Behavior During Therapy: WFL for tasks assessed/performed Overall Cognitive Status: Within Functional Limits for tasks assessed  General Comments: Pt more alert and awake today.        Exercises Total Joint Exercises Ankle Circles/Pumps: AROM;Both;10 reps;Supine Quad Sets: AROM;Left;10 reps;Supine Towel Squeeze: AROM;Both;10 reps;Supine Short Arc Quad: Left;10 reps;Supine;AAROM Heel Slides: AAROM;Left;10 reps;Supine Hip ABduction/ADduction: Left;10 reps;Supine;AAROM Straight Leg Raises:  AAROM;Left;10 reps;Supine    General Comments        Pertinent Vitals/Pain Pain Assessment: 0-10 Pain Score: 9  Faces Pain Scale: Hurts even more Pain Location: L knee with movement Pain Descriptors / Indicators: Constant;Aching;Sharp;Shooting Pain Intervention(s): Monitored during session;Repositioned    Home Living                      Prior Function            PT Goals (current goals can now be found in the care plan section) Acute Rehab PT Goals Patient Stated Goal: go home Potential to Achieve Goals: Fair Progress towards PT goals: Progressing toward goals    Frequency    7X/week      PT Plan Current plan remains appropriate    Co-evaluation              AM-PAC PT "6 Clicks" Daily Activity  Outcome Measure  Difficulty turning over in bed (including adjusting bedclothes, sheets and blankets)?: A Lot Difficulty moving from lying on back to sitting on the side of the bed? : A Lot Difficulty sitting down on and standing up from a chair with arms (e.g., wheelchair, bedside commode, etc,.)?: A Lot Help needed moving to and from a bed to chair (including a wheelchair)?: A Little Help needed walking in hospital room?: A Little Help needed climbing 3-5 steps with a railing? : A Little 6 Click Score: 15    End of Session Equipment Utilized During Treatment: Gait belt Activity Tolerance: Patient limited by pain Patient left: with call bell/phone within reach;with family/visitor present;in bed Nurse Communication: Mobility status;Patient requests pain meds;Precautions;Weight bearing status PT Visit Diagnosis: Other abnormalities of gait and mobility (R26.89);Muscle weakness (generalized) (M62.81);Difficulty in walking, not elsewhere classified (R26.2);Pain Pain - Right/Left: Left Pain - part of body: Knee     Time: 3790-2409 PT Time Calculation (min) (ACUTE ONLY): 40 min  Charges:  $Gait Training: 23-37 mins $Therapeutic Exercise: 8-22 mins                     G Codes:       Robert Mclaughlin, PTA pager [phone]    Robert Mclaughlin 08/24/2016, 4:53 PM

## 2016-08-25 NOTE — Progress Notes (Signed)
Pre medicated pt and placed in bone foam. Pt said it was painful but would give it a try. Pt educated on bone foam.

## 2016-08-25 NOTE — Progress Notes (Signed)
PT called nurse not handling bone foam very well asked to be taken off. Pt educated and taken off bone foam.Said he will try again later but he wanted some sleep

## 2016-08-25 NOTE — Progress Notes (Signed)
Patient discharge instructions reviewed with patient and wife. Patient and wife verbalize understanding. Patient belongings with patient. Patient's wife is driving him home. Patient discharged via wheelchair. Patient is not in distress.

## 2016-08-25 NOTE — Progress Notes (Signed)
Pt stable vss Plan dc today after PT rx on chart

## 2016-08-25 NOTE — Care Management Important Message (Signed)
Important Message  Patient Details  Name: Robert Mclaughlin MRN: [id number] Date of Birth: [birth year]   Medicare Important Message Given:  Yes    [name] 08/25/2016, 1:22 PM

## 2016-08-25 NOTE — Progress Notes (Signed)
Physical Therapy Treatment Patient Details Name: Robert Mclaughlin MRN: [id number] DOB: Apr 15, 1945 Today's Date: 08/25/2016    History of Present Illness Pt is a 71 yo male with c/o of L knee pain secondary to end stage knee OA. s/p L TKA 08/22/16. PMH significant for Occlusion and stenosis of carotid artery without mention of cerebral infarction, and patent foramen ovale.      PT Comments    Pt performed gait and stair training in prep for d/c home.  PTA reviewed knee precautions and educated patient and spouse on frequency of HEP.  Informed nursing that patient is ready to d/c at this time.     Follow Up Recommendations  Home health PT;Supervision/Assistance - 24 hour     Equipment Recommendations  3in1 (PT);Rolling walker with 5" wheels    Recommendations for Other Services       Precautions / Restrictions Precautions Precautions: Knee Precaution Comments: Reviewed resting with knee in extension Restrictions Weight Bearing Restrictions: Yes LLE Weight Bearing: Weight bearing as tolerated    Mobility  Bed Mobility               General bed mobility comments: Pt in recliner chair on arrival.    Transfers Overall transfer level: Needs assistance Equipment used: Rolling walker (2 wheeled) Transfers: Sit to/from Stand Sit to Stand: Supervision         General transfer comment: Pt remains slow and guarded requires cues for L LE advancement to alleviate pain   Ambulation/Gait Ambulation/Gait assistance: Min guard Ambulation Distance (Feet): 250 Feet Assistive device: Rolling walker (2 wheeled) Gait Pattern/deviations: Step-through pattern;Antalgic;Trunk flexed;Decreased stance time - left;Decreased step length - right Gait velocity: slowed   General Gait Details: Cues for L heel strike and and knee extension in stance phase.  Pt remains with antalgic gait but able to increase gait distance with better pain control.     Stairs Stairs: Yes   Stair  Management: One rail Left;With cane;With walker (no rails with RW and L rail with cane.   ) Number of Stairs: 6 (x4 reps with L rail and cane in R hand ascend and cane in L hand descending.  x2 backwards with RW and no rails.  ) General stair comments: Pt performed technique from yesterday backwards with RW to ascend 2 stairs with no rails.  Pt remains to require cues for sequencing and hand placement/RW placement.  Pt then performed x4 stairs with L rail and cane in R hand to simulate steps to his bedroom.  Pt required cues for sequencing and sequencing of his cane.    Wheelchair Mobility    Modified Rankin (Stroke Patients Only)       Balance Overall balance assessment: Needs assistance Sitting-balance support: Bilateral upper extremity supported Sitting balance-Leahy Scale: Fair       Standing balance-Leahy Scale: Poor                              Cognition Arousal/Alertness: Awake/alert Behavior During Therapy: WFL for tasks assessed/performed Overall Cognitive Status: Within Functional Limits for tasks assessed                                 General Comments: Pt more alert and awake today.        Exercises Total Joint Exercises Goniometric ROM: grossly 90 degrees.  L knee flexion.  General Comments        Pertinent Vitals/Pain Pain Assessment: 0-10 Pain Score: 7  Pain Location: L knee with movement Pain Descriptors / Indicators: Constant;Aching;Sharp;Shooting Pain Intervention(s): Monitored during session;Repositioned;Ice applied    Home Living                      Prior Function            PT Goals (current goals can now be found in the care plan section) Acute Rehab PT Goals Patient Stated Goal: go home Potential to Achieve Goals: Fair Progress towards PT goals: Progressing toward goals    Frequency    7X/week      PT Plan Current plan remains appropriate    Co-evaluation              AM-PAC PT  "6 Clicks" Daily Activity  Outcome Measure  Difficulty turning over in bed (including adjusting bedclothes, sheets and blankets)?: A Lot Difficulty moving from lying on back to sitting on the side of the bed? : A Lot Difficulty sitting down on and standing up from a chair with arms (e.g., wheelchair, bedside commode, etc,.)?: A Lot Help needed moving to and from a bed to chair (including a wheelchair)?: A Little Help needed walking in hospital room?: A Little Help needed climbing 3-5 steps with a railing? : A Little 6 Click Score: 15    End of Session Equipment Utilized During Treatment: Gait belt Activity Tolerance: Patient limited by pain Patient left: with call bell/phone within reach;with family/visitor present;in bed Nurse Communication: Mobility status;Patient requests pain meds;Precautions;Weight bearing status PT Visit Diagnosis: Other abnormalities of gait and mobility (R26.89);Muscle weakness (generalized) (M62.81);Difficulty in walking, not elsewhere classified (R26.2);Pain Pain - Right/Left: Left Pain - part of body: Knee     Time: 1655-3748 PT Time Calculation (min) (ACUTE ONLY): 43 min  Charges:  $Gait Training: 23-37 mins $Therapeutic Activity: 8-22 mins                    G Codes:       Governor Rooks, PTA pager [phone]    Cristela Blue 08/25/2016, 10:36 AM

## 2016-08-26 DIAGNOSIS — Z9181 History of falling: Secondary | ICD-10-CM | POA: Diagnosis not present

## 2016-08-26 DIAGNOSIS — Z8673 Personal history of transient ischemic attack (TIA), and cerebral infarction without residual deficits: Secondary | ICD-10-CM | POA: Diagnosis not present

## 2016-08-26 DIAGNOSIS — Z87891 Personal history of nicotine dependence: Secondary | ICD-10-CM | POA: Diagnosis not present

## 2016-08-26 DIAGNOSIS — Z96652 Presence of left artificial knee joint: Secondary | ICD-10-CM | POA: Diagnosis not present

## 2016-08-26 DIAGNOSIS — Z471 Aftercare following joint replacement surgery: Secondary | ICD-10-CM | POA: Diagnosis not present

## 2016-08-28 ENCOUNTER — Telehealth (INDEPENDENT_AMBULATORY_CARE_PROVIDER_SITE_OTHER): Payer: Self-pay | Admitting: Orthopedic Surgery

## 2016-08-28 ENCOUNTER — Encounter (INDEPENDENT_AMBULATORY_CARE_PROVIDER_SITE_OTHER): Payer: Self-pay | Admitting: Orthopedic Surgery

## 2016-08-28 DIAGNOSIS — Z471 Aftercare following joint replacement surgery: Secondary | ICD-10-CM | POA: Diagnosis not present

## 2016-08-28 DIAGNOSIS — Z87891 Personal history of nicotine dependence: Secondary | ICD-10-CM | POA: Diagnosis not present

## 2016-08-28 DIAGNOSIS — Z8673 Personal history of transient ischemic attack (TIA), and cerebral infarction without residual deficits: Secondary | ICD-10-CM | POA: Diagnosis not present

## 2016-08-28 DIAGNOSIS — Z9181 History of falling: Secondary | ICD-10-CM | POA: Diagnosis not present

## 2016-08-28 DIAGNOSIS — Z96652 Presence of left artificial knee joint: Secondary | ICD-10-CM | POA: Diagnosis not present

## 2016-08-28 MED ORDER — HYDROCODONE-ACETAMINOPHEN 7.5-325 MG PO TABS: 1.0000 | ORAL_TABLET | Freq: Four times a day (QID) | ORAL | 0 refills | Status: DC | PRN

## 2016-08-28 NOTE — Telephone Encounter (Signed)
Robert Mclaughlin requesting verbal orders for home health physical therapy 1 time a week for 1 week & 3 times a week for 2 weeks.

## 2016-08-28 NOTE — Telephone Encounter (Signed)
S/w Dr Sharol Given suggested to have patient d/c robaxin and oxycodone and change pain meds to norco. This was done and they were advised. Will pick up rx at front.

## 2016-08-28 NOTE — Telephone Encounter (Signed)
Patient's wife Magda Paganini states that patient had an allergic reaction to one of the medications(she thinks it the methocarbamol) he was given after surgery.

## 2016-08-28 NOTE — Telephone Encounter (Signed)
IC gave verbal

## 2016-08-29 ENCOUNTER — Telehealth (INDEPENDENT_AMBULATORY_CARE_PROVIDER_SITE_OTHER): Payer: Self-pay | Admitting: Orthopedic Surgery

## 2016-08-29 DIAGNOSIS — Z471 Aftercare following joint replacement surgery: Secondary | ICD-10-CM | POA: Diagnosis not present

## 2016-08-29 DIAGNOSIS — Z8673 Personal history of transient ischemic attack (TIA), and cerebral infarction without residual deficits: Secondary | ICD-10-CM | POA: Diagnosis not present

## 2016-08-29 DIAGNOSIS — Z87891 Personal history of nicotine dependence: Secondary | ICD-10-CM | POA: Diagnosis not present

## 2016-08-29 DIAGNOSIS — Z96652 Presence of left artificial knee joint: Secondary | ICD-10-CM | POA: Diagnosis not present

## 2016-08-29 DIAGNOSIS — Z9181 History of falling: Secondary | ICD-10-CM | POA: Diagnosis not present

## 2016-08-29 MED ORDER — CYCLOBENZAPRINE HCL 5 MG PO TABS: 5.0000 mg | ORAL_TABLET | Freq: Three times a day (TID) | ORAL | 0 refills | Status: DC | PRN

## 2016-08-29 NOTE — Telephone Encounter (Signed)
Patient's wife Magda Paganini) called asked if patient can get a different muscle relaxer as well.  Magda Paganini asked if she can get a call back when Rx is sent to the pharmacy. The number to contact Magda Paganini is [phone]

## 2016-08-29 NOTE — Telephone Encounter (Signed)
I advised rx sent.

## 2016-08-29 NOTE — Telephone Encounter (Signed)
Advised Dr Marlou Sa of what was going on. He tried to call. No answer. LM for them. Also I sent in rx for flexeril to pharmacy.

## 2016-08-29 NOTE — Telephone Encounter (Signed)
I spoke with Dr Erlinda Hong about situation mentioned here(see also note from yesterdays call), he said that he felt more comfortable for this to be held for Dr Marlou Sa in regards to changing the muscle relaxer. Patient has had a rash since he was in hospital. They said that a nurse looked at his back while he was still there but did not say anything needed to be done. He went home from hospital and seemed to get worse. Wife reports like a pimple appearance. I had advised patient and wife yesterday likely coming from percocet but Dr Sharol Given recommended just d/c muscle relaxer and change pain meds. We did this yesterday for her. She was upset b/c she called after picked up rx for norco b/c she called and no one ever called her back. I advised her that we never received second message.  She stated that she felt like we were practicing "bad medicine" because both times she called Dr Marlou Sa was in surgery. Each time that she called, I yesterday and today I s/w another provider to try and give her a response in a timely manner.  I apologized multiple times but she stated it did not take away her frustration and we needed to listen to her frustration. I assured her I would s/w Dr Marlou Sa about this and we would call her as soon as we could.

## 2016-08-31 DIAGNOSIS — Z96652 Presence of left artificial knee joint: Secondary | ICD-10-CM | POA: Diagnosis not present

## 2016-08-31 DIAGNOSIS — Z8673 Personal history of transient ischemic attack (TIA), and cerebral infarction without residual deficits: Secondary | ICD-10-CM | POA: Diagnosis not present

## 2016-08-31 DIAGNOSIS — Z9181 History of falling: Secondary | ICD-10-CM | POA: Diagnosis not present

## 2016-08-31 DIAGNOSIS — Z471 Aftercare following joint replacement surgery: Secondary | ICD-10-CM | POA: Diagnosis not present

## 2016-08-31 DIAGNOSIS — Z87891 Personal history of nicotine dependence: Secondary | ICD-10-CM | POA: Diagnosis not present

## 2016-09-04 ENCOUNTER — Ambulatory Visit (INDEPENDENT_AMBULATORY_CARE_PROVIDER_SITE_OTHER): Payer: Medicare Other

## 2016-09-04 ENCOUNTER — Ambulatory Visit (INDEPENDENT_AMBULATORY_CARE_PROVIDER_SITE_OTHER): Payer: Medicare Other | Admitting: Orthopedic Surgery

## 2016-09-04 ENCOUNTER — Encounter (INDEPENDENT_AMBULATORY_CARE_PROVIDER_SITE_OTHER): Payer: Self-pay | Admitting: Orthopedic Surgery

## 2016-09-04 DIAGNOSIS — Z96652 Presence of left artificial knee joint: Secondary | ICD-10-CM

## 2016-09-04 DIAGNOSIS — Z8673 Personal history of transient ischemic attack (TIA), and cerebral infarction without residual deficits: Secondary | ICD-10-CM | POA: Diagnosis not present

## 2016-09-04 DIAGNOSIS — Z9181 History of falling: Secondary | ICD-10-CM | POA: Diagnosis not present

## 2016-09-04 DIAGNOSIS — Z87891 Personal history of nicotine dependence: Secondary | ICD-10-CM | POA: Diagnosis not present

## 2016-09-04 DIAGNOSIS — Z471 Aftercare following joint replacement surgery: Secondary | ICD-10-CM | POA: Diagnosis not present

## 2016-09-04 MED ORDER — DOCUSATE SODIUM 100 MG PO CAPS: 100.0000 mg | ORAL_CAPSULE | Freq: Two times a day (BID) | ORAL | 0 refills | Status: DC

## 2016-09-04 MED ORDER — HYDROCODONE-ACETAMINOPHEN 10-325 MG PO TABS: 1.0000 | ORAL_TABLET | Freq: Four times a day (QID) | ORAL | 0 refills | Status: DC | PRN

## 2016-09-05 NOTE — Discharge Summary (Signed)
Physician Discharge Summary  Patient ID: Robert Mclaughlin MRN: [id number] DOB/AGE: [birth year] 71 y.o.  Admit date: 08/22/2016 Discharge date: 08/25/2016 Admission Diagnoses:  Active Problems:   Arthritis of knee   Discharge Diagnoses:  Same  Surgeries: Procedure(s): LEFT TOTAL KNEE ARTHROPLASTY on 08/22/2016   Consultants:   Discharged Condition: Stable  Hospital Course: Robert Mclaughlin is an 71 y.o. male who was admitted 08/22/2016 with a chief complaint of left knee pain, and found to have a diagnosis of left knee arthritis.  They were brought to the operating room on 08/22/2016 and underwent the above named procedures.  Patient tolerated the procedure well and was mobilized with physical therapy and CPM use.  He was walking in the halls by the time of discharge.  He is discharged to home in good condition on Xarelto for DVT prophylaxis as well as pain medicine.  I'll see him back in 10 days for clinical recheck.  Continue with weightbearing as tolerated and home health PT.  Antibiotics given:  Anti-infectives    Start     Dose/Rate Route Frequency Ordered Stop   08/22/16 1800  ceFAZolin (ANCEF) IVPB 2g/100 mL premix     2 g 200 mL/hr over 30 Minutes Intravenous Every 6 hours 08/22/16 1651 08/23/16 0111   08/22/16 1017  ceFAZolin (ANCEF) IVPB 2g/100 mL premix     2 g 200 mL/hr over 30 Minutes Intravenous On call to O.R. 08/22/16 1017 08/22/16 1248    .  Recent vital signs:  Vitals:   08/24/16 2217 08/25/16 0454  BP: 132/71 115/62  Pulse: 91 87  Resp: 18 18  Temp: 98.7 F (37.1 C) 98.7 F (37.1 C)    Recent laboratory studies:  Results for orders placed or performed during the hospital encounter of 08/11/16  Surgical pcr screen  Result Value Ref Range   MRSA, PCR NEGATIVE NEGATIVE   Staphylococcus aureus NEGATIVE NEGATIVE  Urine culture  Result Value Ref Range   Specimen Description URINE, CLEAN CATCH    Special Requests NONE    Culture NO GROWTH    Report  Status 08/12/2016 FINAL   Basic metabolic panel  Result Value Ref Range   Sodium 136 135 - 145 mmol/L   Potassium 4.4 3.5 - 5.1 mmol/L   Chloride 105 101 - 111 mmol/L   CO2 25 22 - 32 mmol/L   Glucose, Bld 104 (H) 65 - 99 mg/dL   BUN 14 6 - 20 mg/dL   Creatinine, Ser 1.07 0.61 - 1.24 mg/dL   Calcium 9.5 8.9 - 10.3 mg/dL   GFR calc non Af Amer >60 >60 mL/min   GFR calc Af Amer >60 >60 mL/min   Anion gap 6 5 - 15  CBC  Result Value Ref Range   WBC 5.6 4.0 - 10.5 K/uL   RBC 4.95 4.22 - 5.81 MIL/uL   Hemoglobin 15.0 13.0 - 17.0 g/dL   HCT 44.9 39.0 - 52.0 %   MCV 90.7 78.0 - 100.0 fL   MCH 30.3 26.0 - 34.0 pg   MCHC 33.4 30.0 - 36.0 g/dL   RDW 13.7 11.5 - 15.5 %   Platelets 206 150 - 400 K/uL  Urinalysis, Routine w reflex microscopic  Result Value Ref Range   Color, Urine YELLOW YELLOW   APPearance HAZY (A) CLEAR   Specific Gravity, Urine 1.016 1.005 - 1.030   pH 5.0 5.0 - 8.0   Glucose, UA NEGATIVE NEGATIVE mg/dL   Hgb urine dipstick SMALL (A) NEGATIVE  Bilirubin Urine NEGATIVE NEGATIVE   Ketones, ur NEGATIVE NEGATIVE mg/dL   Protein, ur NEGATIVE NEGATIVE mg/dL   Nitrite NEGATIVE NEGATIVE   Leukocytes, UA NEGATIVE NEGATIVE   RBC / HPF 0-5 0 - 5 RBC/hpf   WBC, UA 0-5 0 - 5 WBC/hpf   Bacteria, UA NONE SEEN NONE SEEN   Squamous Epithelial / LPF NONE SEEN NONE SEEN   Mucous PRESENT   Hepatic function panel  Result Value Ref Range   Total Protein 7.3 6.5 - 8.1 g/dL   Albumin 4.2 3.5 - 5.0 g/dL   AST 29 15 - 41 U/L   ALT 22 17 - 63 U/L   Alkaline Phosphatase 37 (L) 38 - 126 U/L   Total Bilirubin 1.0 0.3 - 1.2 mg/dL   Bilirubin, Direct 0.1 0.1 - 0.5 mg/dL   Indirect Bilirubin 0.9 0.3 - 0.9 mg/dL    Discharge Medications:   Allergies as of 08/25/2016      Reactions   Latanoprost Other (See Comments)   Ineffective    Lumigan [bimatoprost] Other (See Comments)   Ineffective      Medication List    STOP taking these medications   acetaminophen-codeine 300-30 MG  tablet Commonly known as:  TYLENOL #3   amoxicillin 500 MG capsule Commonly known as:  AMOXIL   aspirin EC 81 MG tablet     TAKE these medications   ARTIFICIAL TEAR OP Place 1 drop into both eyes daily.   simvastatin 20 MG tablet Commonly known as:  ZOCOR Take 20 mg by mouth at bedtime.       Diagnostic Studies: Dg Chest 2 View  Result Date: 08/11/2016 CLINICAL DATA:  Preoperative evaluation for LEFT knee arthroplasty, history PFO repair in 2018 EXAM: CHEST  2 VIEW COMPARISON:  03/01/2016 FINDINGS: Normal heart size, mediastinal contours, and pulmonary vascularity. PFO occlusion device projects at heart. Lungs clear. No pulmonary infiltrate, pleural effusion or pneumothorax. Mild scattered endplate spur formation thoracic spine. IMPRESSION: PFO occlusion device. No acute abnormalities. Electronically Signed   By: Lavonia Dana M.D.   On: 08/11/2016 13:18   Xr Knee 1-2 Views Left  Result Date: 09/04/2016 AP lateral left knee reviewed.  Total knee prosthesis in good position and alignment.  No pelvic hitting features.  Component appears well sized.   Disposition: 01-Home or Self Care  Discharge Instructions    Call MD / Call 911    Complete by:  As directed    If you experience chest pain or shortness of breath, CALL 911 and be transported to the hospital emergency room.  If you develope a fever above 101 F, pus (white drainage) or increased drainage or redness at the wound, or calf pain, call your surgeon's office.   Constipation Prevention    Complete by:  As directed    Drink plenty of fluids.  Prune juice may be helpful.  You may use a stool softener, such as Colace (over the counter) 100 mg twice a day.  Use MiraLax (over the counter) for constipation as needed.   Diet - low sodium heart healthy    Complete by:  As directed    Discharge instructions    Complete by:  As directed    Weight bearing as tolerated left leg Remove dressing next Friday - ok to shower with and  without dressing on Use cpm 3 hours per day minimum Work on full extension   Increase activity slowly as tolerated    Complete by:  As directed  Follow-up Information    [facility] Follow up.   Specialty:  [place] Why:  Home Health Physical Therapy Contact information: [address] [place] [address] [phone]            Signed: Anderson Malta 09/05/2016, 12:01 PM

## 2016-09-06 DIAGNOSIS — Z87891 Personal history of nicotine dependence: Secondary | ICD-10-CM | POA: Diagnosis not present

## 2016-09-06 DIAGNOSIS — Z9181 History of falling: Secondary | ICD-10-CM | POA: Diagnosis not present

## 2016-09-06 DIAGNOSIS — Z8673 Personal history of transient ischemic attack (TIA), and cerebral infarction without residual deficits: Secondary | ICD-10-CM | POA: Diagnosis not present

## 2016-09-06 DIAGNOSIS — Z471 Aftercare following joint replacement surgery: Secondary | ICD-10-CM | POA: Diagnosis not present

## 2016-09-06 DIAGNOSIS — Z96652 Presence of left artificial knee joint: Secondary | ICD-10-CM | POA: Diagnosis not present

## 2016-09-06 NOTE — Progress Notes (Signed)
Post-Op Visit Note   Patient: Robert Mclaughlin           Date of Birth: [birth year]           MRN: [id number] Visit Date: 09/04/2016 PCP: Robert Cruel, MD   Assessment & Plan:  Chief Complaint:  Chief Complaint  Patient presents with  . Left Knee - Routine Post Op   Visit Diagnoses:  1. Presence of left artificial knee joint     Plan: Robert Mclaughlin is a 71 year old patient who is now a couple weeks out left total knee replacement.  CPM machine is at 90.  Taking hydrocodone on an as needed basis and that is refilled today.  We'll also refill his Colace.  We'll also put him back on his daily aspirin after he completes his Xarelto course.  On exam the incisions intact and he does have good flexion but lacking about 10 of extension plan is to start outpatient physical therapy to work on achieving full extension as well as work on achieving easy flexion past 90 for back in 4 weeks for clinical recheck tenderness today.  Follow-Up Instructions: Return in about 4 weeks (around 10/02/2016).   Orders:  Orders Placed This Encounter  Procedures  . XR Knee 1-2 Views Left   Meds ordered this encounter  Medications  . HYDROcodone-acetaminophen (NORCO) 10-325 MG tablet    Sig: Take 1 tablet by mouth every 6 (six) hours as needed.    Dispense:  30 tablet    Refill:  0  . docusate sodium (COLACE) 100 MG capsule    Sig: Take 1 capsule (100 mg total) by mouth 2 (two) times daily.    Dispense:  20 capsule    Refill:  0    Imaging: No results found.  PMFS History: Patient Active Problem List   Diagnosis Date Noted  . Arthritis of knee 08/22/2016  . Primary osteoarthritis of left knee 04/28/2016  . PFO (patent foramen ovale) 04/11/2016  . Occlusion and stenosis of carotid artery without mention of cerebral infarction 07/24/2011   Past Medical History:  Diagnosis Date  . Arthritis    "left knee" (04/11/2016)  . Carotid artery occlusion   . Dysrhythmia    iiregular heart beat yrs  ago  . Migraine equivalent    "none in years" (04/11/2016)  . PFO (patent foramen ovale)    diagnosed 04/2006  . Stroke [facility]) 2008   denies residual on 04/11/2016    Family History  Problem Relation Age of Onset  . Heart disease Mother        Heart Disease before age 74  . Varicose Veins Mother   . Hyperlipidemia Father     Past Surgical History:  Procedure Laterality Date  . MASS EXCISION Right 2017   "precancerous growth on my leg"  . MOUTH SURGERY     tooth extraction and root extraction   . PATENT FORAMEN OVALE CLOSURE  04/11/2016  . PATENT FORAMEN OVALE CLOSURE N/A 04/11/2016   Procedure: Patent Forament Ovale(PFO) Closure;  Surgeon: Sherren Mocha, MD;  Location: [place] [facility];  Service: Cardiovascular;  Laterality: N/A;  . TONSILLECTOMY    . TOOTH EXTRACTION    . TOTAL KNEE ARTHROPLASTY Left 08/22/2016   Procedure: LEFT TOTAL KNEE ARTHROPLASTY;  Surgeon: Meredith Pel, MD;  Location: [place];  Service: Orthopedics;  Laterality: Left;   Social History   Occupational History  . Not on file.   Social History Main Topics  . Smoking  status: Former Smoker    Packs/day: 1.00    Years: 6.00    Quit date: 03/13/1969  . Smokeless tobacco: Never Used  . Alcohol use 8.4 oz/week    14 Cans of beer per week     Comment: 2 drinks per day  . Drug use: No  . Sexual activity: Yes

## 2016-09-08 DIAGNOSIS — Z9181 History of falling: Secondary | ICD-10-CM | POA: Diagnosis not present

## 2016-09-08 DIAGNOSIS — Z471 Aftercare following joint replacement surgery: Secondary | ICD-10-CM | POA: Diagnosis not present

## 2016-09-08 DIAGNOSIS — Z87891 Personal history of nicotine dependence: Secondary | ICD-10-CM | POA: Diagnosis not present

## 2016-09-08 DIAGNOSIS — Z96652 Presence of left artificial knee joint: Secondary | ICD-10-CM | POA: Diagnosis not present

## 2016-09-08 DIAGNOSIS — Z8673 Personal history of transient ischemic attack (TIA), and cerebral infarction without residual deficits: Secondary | ICD-10-CM | POA: Diagnosis not present

## 2016-09-11 ENCOUNTER — Telehealth (INDEPENDENT_AMBULATORY_CARE_PROVIDER_SITE_OTHER): Payer: Self-pay | Admitting: *Deleted

## 2016-09-11 DIAGNOSIS — M1712 Unilateral primary osteoarthritis, left knee: Secondary | ICD-10-CM | POA: Diagnosis not present

## 2016-09-11 NOTE — Telephone Encounter (Signed)
Okay for Colace 100 mg by mouth twice a day for 20 days #40 with a refill please call thanks

## 2016-09-11 NOTE — Telephone Encounter (Signed)
Please advise. Thanks.  

## 2016-09-11 NOTE — Telephone Encounter (Signed)
Pt had sx on 09/04/16 and is having issues with constipation, pt is taking Hydrocodone, wants to know if can get somethng prescribed to him to help relief him or if can take Miralax. Please advise  CB [phone]  [facility] [address]

## 2016-09-12 DIAGNOSIS — M1712 Unilateral primary osteoarthritis, left knee: Secondary | ICD-10-CM | POA: Diagnosis not present

## 2016-09-12 NOTE — Telephone Encounter (Signed)
FYI I called to let them know we could send them something, his wife stated that they were tired of waiting on [place] so they contacted a friend who was a physician and they sent him something in. I apologized.

## 2016-09-15 DIAGNOSIS — M1712 Unilateral primary osteoarthritis, left knee: Secondary | ICD-10-CM | POA: Diagnosis not present

## 2016-09-18 DIAGNOSIS — M1712 Unilateral primary osteoarthritis, left knee: Secondary | ICD-10-CM | POA: Diagnosis not present

## 2016-09-20 DIAGNOSIS — M1712 Unilateral primary osteoarthritis, left knee: Secondary | ICD-10-CM | POA: Diagnosis not present

## 2016-09-25 DIAGNOSIS — M1712 Unilateral primary osteoarthritis, left knee: Secondary | ICD-10-CM | POA: Diagnosis not present

## 2016-09-27 DIAGNOSIS — M1712 Unilateral primary osteoarthritis, left knee: Secondary | ICD-10-CM | POA: Diagnosis not present

## 2016-10-02 DIAGNOSIS — M1712 Unilateral primary osteoarthritis, left knee: Secondary | ICD-10-CM | POA: Diagnosis not present

## 2016-10-04 ENCOUNTER — Ambulatory Visit (INDEPENDENT_AMBULATORY_CARE_PROVIDER_SITE_OTHER): Payer: Medicare Other | Admitting: Orthopedic Surgery

## 2016-10-04 DIAGNOSIS — Z96652 Presence of left artificial knee joint: Secondary | ICD-10-CM

## 2016-10-04 DIAGNOSIS — M1712 Unilateral primary osteoarthritis, left knee: Secondary | ICD-10-CM | POA: Diagnosis not present

## 2016-10-06 NOTE — Progress Notes (Signed)
   Post-Op Visit Note   Patient: Robert Mclaughlin           Date of Birth: Mar 24, 1945           MRN: [id number] Visit Date: 10/04/2016 PCP: Lawerance Cruel, MD   Assessment & Plan:  Chief Complaint:  Chief Complaint  Patient presents with  . Left Knee - Routine Post Op   Visit Diagnoses:  1. Presence of left artificial knee joint     Plan: [name] is a 71 year old patient who is now 5 weeks out left total knee replacement.  He's doing well.  No medicines for pain.  He's doing physical therapy as an outpatient.  He is also doing home exercises.  On exam he has improving range of motion.  Lacking about 5 of full extension flexion past 90.  Plan is to continue current activity level working on primarily range of motion.  Follow-up with me in 6 weeks for final clinical check.  Follow-Up Instructions: Return in about 6 weeks (around 11/15/2016).   Orders:  No orders of the defined types were placed in this encounter.  No orders of the defined types were placed in this encounter.   Imaging: No results found.  PMFS History: Patient Active Problem List   Diagnosis Date Noted  . Arthritis of knee 08/22/2016  . Primary osteoarthritis of left knee 04/28/2016  . PFO (patent foramen ovale) 04/11/2016  . Occlusion and stenosis of carotid artery without mention of cerebral infarction 07/24/2011   Past Medical History:  Diagnosis Date  . Arthritis    "left knee" (04/11/2016)  . Carotid artery occlusion   . Dysrhythmia    iiregular heart beat yrs ago  . Migraine equivalent    "none in years" (04/11/2016)  . PFO (patent foramen ovale)    diagnosed 04/2006  . Stroke [facility]) 2008   denies residual on 04/11/2016    Family History  Problem Relation Age of Onset  . Heart disease Mother        Heart Disease before age 82  . Varicose Veins Mother   . Hyperlipidemia Father     Past Surgical History:  Procedure Laterality Date  . MASS EXCISION Right 2017   "precancerous growth on my  leg"  . MOUTH SURGERY     tooth extraction and root extraction   . PATENT FORAMEN OVALE CLOSURE  04/11/2016  . PATENT FORAMEN OVALE CLOSURE N/A 04/11/2016   Procedure: Patent Forament Ovale(PFO) Closure;  Surgeon: Sherren Mocha, MD;  Location: [facility] [facility];  Service: Cardiovascular;  Laterality: N/A;  . TONSILLECTOMY    . TOOTH EXTRACTION    . TOTAL KNEE ARTHROPLASTY Left 08/22/2016   Procedure: LEFT TOTAL KNEE ARTHROPLASTY;  Surgeon: Meredith Pel, MD;  Location: [place];  Service: Orthopedics;  Laterality: Left;   Social History   Occupational History  . Not on file.   Social History Main Topics  . Smoking status: Former Smoker    Packs/day: 1.00    Years: 6.00    Quit date: 03/13/1969  . Smokeless tobacco: Never Used  . Alcohol use 8.4 oz/week    14 Cans of beer per week     Comment: 2 drinks per day  . Drug use: No  . Sexual activity: Yes

## 2016-10-09 DIAGNOSIS — M1712 Unilateral primary osteoarthritis, left knee: Secondary | ICD-10-CM | POA: Diagnosis not present

## 2016-10-11 DIAGNOSIS — M1712 Unilateral primary osteoarthritis, left knee: Secondary | ICD-10-CM | POA: Diagnosis not present

## 2016-10-24 DIAGNOSIS — M1712 Unilateral primary osteoarthritis, left knee: Secondary | ICD-10-CM | POA: Diagnosis not present

## 2016-10-26 DIAGNOSIS — M1712 Unilateral primary osteoarthritis, left knee: Secondary | ICD-10-CM | POA: Diagnosis not present

## 2016-10-30 DIAGNOSIS — M1712 Unilateral primary osteoarthritis, left knee: Secondary | ICD-10-CM | POA: Diagnosis not present

## 2016-11-01 DIAGNOSIS — M1712 Unilateral primary osteoarthritis, left knee: Secondary | ICD-10-CM | POA: Diagnosis not present

## 2016-11-03 DIAGNOSIS — Z23 Encounter for immunization: Secondary | ICD-10-CM | POA: Diagnosis not present

## 2016-11-06 ENCOUNTER — Other Ambulatory Visit: Payer: Self-pay

## 2016-11-06 ENCOUNTER — Encounter: Payer: Self-pay | Admitting: Cardiovascular Disease

## 2016-11-06 ENCOUNTER — Ambulatory Visit (HOSPITAL_COMMUNITY): Payer: Medicare Other | Attending: Cardiology

## 2016-11-06 ENCOUNTER — Ambulatory Visit (INDEPENDENT_AMBULATORY_CARE_PROVIDER_SITE_OTHER): Payer: Medicare Other | Admitting: Cardiovascular Disease

## 2016-11-06 VITALS — BP 118/62 | HR 79 | Ht 70.0 in | Wt 176.8 lb

## 2016-11-06 DIAGNOSIS — I351 Nonrheumatic aortic (valve) insufficiency: Secondary | ICD-10-CM | POA: Insufficient documentation

## 2016-11-06 DIAGNOSIS — Z8673 Personal history of transient ischemic attack (TIA), and cerebral infarction without residual deficits: Secondary | ICD-10-CM | POA: Insufficient documentation

## 2016-11-06 DIAGNOSIS — Q211 Atrial septal defect: Secondary | ICD-10-CM

## 2016-11-06 DIAGNOSIS — I6523 Occlusion and stenosis of bilateral carotid arteries: Secondary | ICD-10-CM | POA: Diagnosis not present

## 2016-11-06 DIAGNOSIS — Z87891 Personal history of nicotine dependence: Secondary | ICD-10-CM | POA: Diagnosis not present

## 2016-11-06 DIAGNOSIS — Q2112 Patent foramen ovale: Secondary | ICD-10-CM

## 2016-11-06 DIAGNOSIS — Z8249 Family history of ischemic heart disease and other diseases of the circulatory system: Secondary | ICD-10-CM | POA: Diagnosis not present

## 2016-11-06 NOTE — Patient Instructions (Signed)
Medication Instructions:  Your physician recommends that you continue on your current medications as directed. Please refer to the Current Medication list given to you today.   Labwork: None ordered  Testing/Procedures: None ordered  Follow-Up: Your physician wants you to follow-up AS NEEDED  Any Other Special Instructions Will Be Listed Below (If Applicable).     If you need a refill on your cardiac medications before your next appointment, please call your pharmacy.   

## 2016-11-06 NOTE — Progress Notes (Signed)
Cardiology Office Note Date:  11/06/2016   ID:  Robert Mclaughlin, Robert Mclaughlin [birth year], MRN [id number]  PCP:  Lawerance Cruel, MD  Cardiologist:  Sherren Mocha, MD    Chief Complaint  Patient presents with  . Follow-up    Post-PFO closure     History of Present Illness: Robert Mclaughlin is a 71 y.o. male who presents for Cardiology follow-up after undergoing transcatheter PFO closure 03/22/2016. The patient's post procedure course was uncomplicated. He underwent knee replacement surgery after undergoing PFO closure. He's had a remote stroke but has had no recent stroke or TIA symptoms. He has now completed over 6 months of clopidogrel and is back to just taking a low-dose aspirin on a daily basis. He denies chest pain, chest pressure, heart palpitations, or shortness of breath.   Past Medical History:  Diagnosis Date  . Arthritis    "left knee" (04/11/2016)  . Carotid artery occlusion   . Dysrhythmia    iiregular heart beat yrs ago  . Migraine equivalent    "none in years" (04/11/2016)  . PFO (patent foramen ovale)    diagnosed 04/2006  . Stroke [facility]) 2008   denies residual on 04/11/2016    Past Surgical History:  Procedure Laterality Date  . MASS EXCISION Right 2017   "precancerous growth on my leg"  . MOUTH SURGERY     tooth extraction and root extraction   . PATENT FORAMEN OVALE CLOSURE  04/11/2016  . PATENT FORAMEN OVALE CLOSURE N/A 04/11/2016   Procedure: Patent Forament Ovale(PFO) Closure;  Surgeon: Sherren Mocha, MD;  Location: [place] [facility];  Service: Cardiovascular;  Laterality: N/A;  . TONSILLECTOMY    . TOOTH EXTRACTION    . TOTAL KNEE ARTHROPLASTY Left 08/22/2016   Procedure: LEFT TOTAL KNEE ARTHROPLASTY;  Surgeon: Meredith Pel, MD;  Location: [place];  Service: Orthopedics;  Laterality: Left;    Current Outpatient Prescriptions  Medication Sig Dispense Refill  . ARTIFICIAL TEAR OP Place 1 drop into both eyes daily.     [name] Kitchen aspirin EC 81 MG  tablet Take 81 mg by mouth daily.    . diclofenac sodium (VOLTAREN) 1 % GEL Apply 1 application topically as needed.    [name] Kitchen ibuprofen (ADVIL,MOTRIN) 400 MG tablet Take 400 mg by mouth as needed.    . simvastatin (ZOCOR) 20 MG tablet Take 20 mg by mouth at bedtime.      No current facility-administered medications for this visit.     Allergies:   Latanoprost and Lumigan [bimatoprost]   Social History:  The patient  reports that he quit smoking about 47 years ago. He has a 6.00 pack-year smoking history. He has never used smokeless tobacco. He reports that he drinks about 8.4 oz of alcohol per week . He reports that he does not use drugs.   Family History:  The patient's  family history includes Heart disease in his mother; Hyperlipidemia in his father; Varicose Veins in his mother.    ROS:  Please see the history of present illness.   All other systems are reviewed and negative.    PHYSICAL EXAM: VS:  BP 118/62   Pulse 79   Ht 5\' 10"  (1.778 m)   Wt 176 lb 12.8 oz (80.2 kg)   SpO2 98%   BMI 25.37 kg/m  , BMI Body mass index is 25.37 kg/m. GEN: Well nourished, well developed, in no acute distress  HEENT: normal  Neck: no JVD, no masses.  Cardiac: RRR  without murmur or gallop                Respiratory:  clear to auscultation bilaterally, normal work of breathing GI: soft, nontender, nondistended, + BS MS: no deformity or atrophy  Ext: no pretibial edema, pedal pulses 2+= bilaterally Skin: warm and dry, no rash Neuro:  Strength and sensation are intact Psych: euthymic mood, full affect  EKG:  EKG is not ordered today.  Recent Labs: 08/11/2016: ALT 22; BUN 14; Creatinine, Ser 1.07; Hemoglobin 15.0; Platelets 206; Potassium 4.4; Sodium 136   Lipid Panel  No results found for: CHOL, TRIG, HDL, CHOLHDL, VLDL, LDLCALC, LDLDIRECT    Wt Readings from Last 3 Encounters:  11/06/16 176 lb 12.8 oz (80.2 kg)  08/22/16 183 lb 9.6 oz (83.3 kg)  08/11/16 183 lb 9.6 oz (83.3 kg)      Cardiac Studies Reviewed: 2D Echo (today): Study Conclusions  - Left ventricle: The cavity size was mildly dilated. Systolic   function was normal. The estimated ejection fraction was in the   range of 55% to 60%. Wall motion was normal; there were no   regional wall motion abnormalities. There was an increased   relative contribution of atrial contraction to ventricular   filling. Doppler parameters are consistent with abnormal left   ventricular relaxation (grade 1 diastolic dysfunction). - Aortic valve: There was mild regurgitation. Regurgitation   pressure half-time: 708 ms. - Mitral valve: There was trivial regurgitation. - Right ventricle: The cavity size was mildly dilated. Wall   thickness was normal. - Right atrium: The atrium was mildly dilated. - Atrial septum: There is an Amplatzer closure device in the   interatrial septum with no evidence of shunt by colorflow   doppler. - Pulmonic valve: There was trivial regurgitation. - Impressions: Normal LVF with EF 55-60% with G1DD, mild AR,   trivial MR, mildly dilated RV and RA, stable amplatzer device in   interatrial septum with no evidence of flow by colorflow doppler.  Impressions:  - Normal LVF with EF 55-60% with G1DD, mild AR, trivial MR, mildly   dilated RV and RA, stable amplatzer device in interatrial septum   with no evidence of flow by colorflow doppler.  ASSESSMENT AND PLAN: History of cryptogenic stroke now status post transcatheter PFO closure.  The patient is doing well and should continue on long-term antiplatelet therapy with low-dose aspirin. He does not require SBE prophylaxis any longer for his heart. He may continue to follow SBE prophylaxis because of his knee prosthesis. I reviewed his echo images today which demonstrate normal device position with no evidence of color flow across the interatrial septum. I would be happy to see him back in the future if any problems arise, otherwise he can follow-up  as needed.  Current medicines are reviewed with the patient today.  The patient does not have concerns regarding medicines.  Labs/ tests ordered today include:   Orders Placed This Encounter  Procedures  . EKG 12-Lead    Disposition:   FU as needed  Signed, Sherren Mocha, MD  11/06/2016 6:00 PM    [facility] [place], [place], [place]  [address] Phone: [phone]; Fax: [phone]

## 2016-11-15 ENCOUNTER — Ambulatory Visit (INDEPENDENT_AMBULATORY_CARE_PROVIDER_SITE_OTHER): Payer: Medicare Other | Admitting: Orthopedic Surgery

## 2016-11-15 DIAGNOSIS — Z96652 Presence of left artificial knee joint: Secondary | ICD-10-CM

## 2016-11-15 NOTE — Progress Notes (Signed)
Post-Op Visit Note   Patient: Robert Mclaughlin           Date of Birth: Jun 10, 1945           MRN: [id number] Visit Date: 11/15/2016 PCP: Lawerance Cruel, MD   Assessment & Plan:  Chief Complaint:  Chief Complaint  Patient presents with  . Left Knee - Pain   Visit Diagnoses: No diagnosis found.  Plan: Changes a 71 year old patient who is now almost 3 months out left total knee replacement.  He's doing well.  Has a little bit of stiffness when he plays golf in the knee is also stiff in the morning when he wakes up.  He has stopped physical therapy.  He is going to the gym daily.  On exam he has flexion to about 105.  No effusion.  Incision intact.  Plan is to continue using topical anti-inflammatory.  I think a regular anti-inflammatory might also help him regain a little bit more motion but he wants to hold off on that.  Going to [place] in 2 weeks.  I think he'll be well seated in regards to his knee for that amount of walking.  Follow-up in 9 months for clinical recheck on final gains in range of motion  Follow-Up Instructions: Return in about 9 months (around 08/15/2017).   Orders:  No orders of the defined types were placed in this encounter.  No orders of the defined types were placed in this encounter.   Imaging: No results found.  PMFS History: Patient Active Problem List   Diagnosis Date Noted  . Arthritis of knee 08/22/2016  . Primary osteoarthritis of left knee 04/28/2016  . PFO (patent foramen ovale) 04/11/2016  . Occlusion and stenosis of carotid artery without mention of cerebral infarction 07/24/2011   Past Medical History:  Diagnosis Date  . Arthritis    "left knee" (04/11/2016)  . Carotid artery occlusion   . Dysrhythmia    iiregular heart beat yrs ago  . Migraine equivalent    "none in years" (04/11/2016)  . PFO (patent foramen ovale)    diagnosed 04/2006  . Stroke [facility]) 2008   denies residual on 04/11/2016    Family History  Problem Relation  Age of Onset  . Heart disease Mother        Heart Disease before age 56  . Varicose Veins Mother   . Hyperlipidemia Father     Past Surgical History:  Procedure Laterality Date  . MASS EXCISION Right 2017   "precancerous growth on my leg"  . MOUTH SURGERY     tooth extraction and root extraction   . PATENT FORAMEN OVALE CLOSURE  04/11/2016  . PATENT FORAMEN OVALE CLOSURE N/A 04/11/2016   Procedure: Patent Forament Ovale(PFO) Closure;  Surgeon: Sherren Mocha, MD;  Location: [facility];  Service: Cardiovascular;  Laterality: N/A;  . TONSILLECTOMY    . TOOTH EXTRACTION    . TOTAL KNEE ARTHROPLASTY Left 08/22/2016   Procedure: LEFT TOTAL KNEE ARTHROPLASTY;  Surgeon: Meredith Pel, MD;  Location: [facility];  Service: Orthopedics;  Laterality: Left;   Social History   Occupational History  . Not on file.   Social History Main Topics  . Smoking status: Former Smoker    Packs/day: 1.00    Years: 6.00    Quit date: 03/13/1969  . Smokeless tobacco: Never Used  . Alcohol use 8.4 oz/week    14 Cans of beer per week     Comment: 2  drinks per day  . Drug use: No  . Sexual activity: Yes

## 2016-11-27 DIAGNOSIS — E7800 Pure hypercholesterolemia, unspecified: Secondary | ICD-10-CM | POA: Diagnosis not present

## 2016-11-27 DIAGNOSIS — Z0000 Encounter for general adult medical examination without abnormal findings: Secondary | ICD-10-CM | POA: Diagnosis not present

## 2016-11-27 DIAGNOSIS — R972 Elevated prostate specific antigen [PSA]: Secondary | ICD-10-CM | POA: Diagnosis not present

## 2016-11-27 DIAGNOSIS — Z79899 Other long term (current) drug therapy: Secondary | ICD-10-CM | POA: Diagnosis not present

## 2016-11-30 ENCOUNTER — Other Ambulatory Visit (INDEPENDENT_AMBULATORY_CARE_PROVIDER_SITE_OTHER): Payer: Self-pay | Admitting: Orthopedic Surgery

## 2017-01-16 DIAGNOSIS — H401131 Primary open-angle glaucoma, bilateral, mild stage: Secondary | ICD-10-CM | POA: Diagnosis not present

## 2017-01-16 DIAGNOSIS — H524 Presbyopia: Secondary | ICD-10-CM | POA: Diagnosis not present

## 2017-04-16 DIAGNOSIS — H401131 Primary open-angle glaucoma, bilateral, mild stage: Secondary | ICD-10-CM | POA: Diagnosis not present

## 2017-06-15 DIAGNOSIS — E7800 Pure hypercholesterolemia, unspecified: Secondary | ICD-10-CM | POA: Diagnosis not present

## 2017-06-15 DIAGNOSIS — Z0000 Encounter for general adult medical examination without abnormal findings: Secondary | ICD-10-CM | POA: Diagnosis not present

## 2017-06-15 DIAGNOSIS — Z8673 Personal history of transient ischemic attack (TIA), and cerebral infarction without residual deficits: Secondary | ICD-10-CM | POA: Diagnosis not present

## 2017-06-15 DIAGNOSIS — L570 Actinic keratosis: Secondary | ICD-10-CM | POA: Diagnosis not present

## 2017-06-15 DIAGNOSIS — Z1389 Encounter for screening for other disorder: Secondary | ICD-10-CM | POA: Diagnosis not present

## 2017-06-15 DIAGNOSIS — Z79899 Other long term (current) drug therapy: Secondary | ICD-10-CM | POA: Diagnosis not present

## 2017-06-15 DIAGNOSIS — R972 Elevated prostate specific antigen [PSA]: Secondary | ICD-10-CM | POA: Diagnosis not present

## 2017-06-15 DIAGNOSIS — Z7189 Other specified counseling: Secondary | ICD-10-CM | POA: Diagnosis not present

## 2017-07-12 ENCOUNTER — Emergency Department (HOSPITAL_COMMUNITY): Payer: Medicare Other

## 2017-07-12 ENCOUNTER — Inpatient Hospital Stay (HOSPITAL_COMMUNITY): Payer: Medicare Other

## 2017-07-12 ENCOUNTER — Encounter (HOSPITAL_COMMUNITY): Payer: Self-pay | Admitting: Emergency Medicine

## 2017-07-12 ENCOUNTER — Other Ambulatory Visit: Payer: Self-pay

## 2017-07-12 ENCOUNTER — Inpatient Hospital Stay (HOSPITAL_COMMUNITY)
Admission: EM | Admit: 2017-07-12 | Discharge: 2017-07-13 | DRG: 066 | Disposition: A | Discharge status: Home / Self Care | Payer: Medicare Other | Attending: Family Medicine | Admitting: Family Medicine

## 2017-07-12 DIAGNOSIS — Z791 Long term (current) use of non-steroidal anti-inflammatories (NSAID): Secondary | ICD-10-CM | POA: Diagnosis not present

## 2017-07-12 DIAGNOSIS — Q211 Atrial septal defect: Secondary | ICD-10-CM | POA: Diagnosis not present

## 2017-07-12 DIAGNOSIS — Z8349 Family history of other endocrine, nutritional and metabolic diseases: Secondary | ICD-10-CM

## 2017-07-12 DIAGNOSIS — Z8249 Family history of ischemic heart disease and other diseases of the circulatory system: Secondary | ICD-10-CM | POA: Diagnosis not present

## 2017-07-12 DIAGNOSIS — I493 Ventricular premature depolarization: Secondary | ICD-10-CM | POA: Diagnosis not present

## 2017-07-12 DIAGNOSIS — R42 Dizziness and giddiness: Secondary | ICD-10-CM | POA: Diagnosis not present

## 2017-07-12 DIAGNOSIS — M1712 Unilateral primary osteoarthritis, left knee: Secondary | ICD-10-CM | POA: Diagnosis not present

## 2017-07-12 DIAGNOSIS — R29701 NIHSS score 1: Secondary | ICD-10-CM | POA: Diagnosis present

## 2017-07-12 DIAGNOSIS — Z87891 Personal history of nicotine dependence: Secondary | ICD-10-CM | POA: Diagnosis not present

## 2017-07-12 DIAGNOSIS — Z7902 Long term (current) use of antithrombotics/antiplatelets: Secondary | ICD-10-CM | POA: Diagnosis not present

## 2017-07-12 DIAGNOSIS — I340 Nonrheumatic mitral (valve) insufficiency: Secondary | ICD-10-CM | POA: Diagnosis not present

## 2017-07-12 DIAGNOSIS — Z8774 Personal history of (corrected) congenital malformations of heart and circulatory system: Secondary | ICD-10-CM

## 2017-07-12 DIAGNOSIS — Z7982 Long term (current) use of aspirin: Secondary | ICD-10-CM | POA: Diagnosis not present

## 2017-07-12 DIAGNOSIS — Z888 Allergy status to other drugs, medicaments and biological substances status: Secondary | ICD-10-CM

## 2017-07-12 DIAGNOSIS — Z8673 Personal history of transient ischemic attack (TIA), and cerebral infarction without residual deficits: Secondary | ICD-10-CM | POA: Diagnosis not present

## 2017-07-12 DIAGNOSIS — R260 Ataxic gait: Secondary | ICD-10-CM | POA: Diagnosis present

## 2017-07-12 DIAGNOSIS — E860 Dehydration: Secondary | ICD-10-CM | POA: Diagnosis present

## 2017-07-12 DIAGNOSIS — I63412 Cerebral infarction due to embolism of left middle cerebral artery: Secondary | ICD-10-CM | POA: Diagnosis present

## 2017-07-12 DIAGNOSIS — I639 Cerebral infarction, unspecified: Secondary | ICD-10-CM | POA: Diagnosis not present

## 2017-07-12 DIAGNOSIS — Z79899 Other long term (current) drug therapy: Secondary | ICD-10-CM | POA: Diagnosis not present

## 2017-07-12 DIAGNOSIS — Z96652 Presence of left artificial knee joint: Secondary | ICD-10-CM | POA: Diagnosis present

## 2017-07-12 DIAGNOSIS — I63442 Cerebral infarction due to embolism of left cerebellar artery: Principal | ICD-10-CM | POA: Diagnosis present

## 2017-07-12 DIAGNOSIS — Q2112 Patent foramen ovale: Secondary | ICD-10-CM

## 2017-07-12 DIAGNOSIS — I951 Orthostatic hypotension: Secondary | ICD-10-CM | POA: Diagnosis not present

## 2017-07-12 DIAGNOSIS — R270 Ataxia, unspecified: Secondary | ICD-10-CM | POA: Diagnosis present

## 2017-07-12 LAB — URINALYSIS, ROUTINE W REFLEX MICROSCOPIC
BILIRUBIN URINE: NEGATIVE
GLUCOSE, UA: NEGATIVE mg/dL
HGB URINE DIPSTICK: NEGATIVE
Ketones, ur: 5 mg/dL — AB
Leukocytes, UA: NEGATIVE
NITRITE: NEGATIVE
PROTEIN: NEGATIVE mg/dL
SPECIFIC GRAVITY, URINE: 1.019 (ref 1.005–1.030)
pH: 5 (ref 5.0–8.0)

## 2017-07-12 LAB — DIFFERENTIAL
BASOS ABS: 0 10*3/uL (ref 0.0–0.1)
Basophils Relative: 1 %
Eosinophils Absolute: 0.1 10*3/uL (ref 0.0–0.7)
Eosinophils Relative: 1 %
LYMPHS ABS: 1 10*3/uL (ref 0.7–4.0)
LYMPHS PCT: 22 %
Monocytes Absolute: 0.4 10*3/uL (ref 0.1–1.0)
Monocytes Relative: 8 %
NEUTROS ABS: 3.1 10*3/uL (ref 1.7–7.7)
NEUTROS PCT: 68 %

## 2017-07-12 LAB — BASIC METABOLIC PANEL
ANION GAP: 8 (ref 5–15)
BUN: 16 mg/dL (ref 6–20)
CALCIUM: 8.6 mg/dL — AB (ref 8.9–10.3)
CO2: 24 mmol/L (ref 22–32)
CREATININE: 1.09 mg/dL (ref 0.61–1.24)
Chloride: 105 mmol/L (ref 101–111)
Glucose, Bld: 92 mg/dL (ref 65–99)
Potassium: 4.1 mmol/L (ref 3.5–5.1)
SODIUM: 137 mmol/L (ref 135–145)

## 2017-07-12 LAB — PROTIME-INR
INR: 1.12
PROTHROMBIN TIME: 14.3 s (ref 11.4–15.2)

## 2017-07-12 LAB — RAPID URINE DRUG SCREEN, HOSP PERFORMED
Amphetamines: NOT DETECTED
BARBITURATES: NOT DETECTED
BENZODIAZEPINES: NOT DETECTED
Cocaine: NOT DETECTED
Opiates: NOT DETECTED
Tetrahydrocannabinol: NOT DETECTED

## 2017-07-12 LAB — LACTATE DEHYDROGENASE: LDH: 176 U/L (ref 98–192)

## 2017-07-12 LAB — PHOSPHORUS: PHOSPHORUS: 3.4 mg/dL (ref 2.5–4.6)

## 2017-07-12 LAB — URIC ACID: Uric Acid, Serum: 7.7 mg/dL — ABNORMAL HIGH (ref 4.4–7.6)

## 2017-07-12 LAB — APTT: APTT: 27 s (ref 24–36)

## 2017-07-12 LAB — CBC
HCT: 38.5 % — ABNORMAL LOW (ref 39.0–52.0)
Hemoglobin: 13 g/dL (ref 13.0–17.0)
MCH: 30.3 pg (ref 26.0–34.0)
MCHC: 33.8 g/dL (ref 30.0–36.0)
MCV: 89.7 fL (ref 78.0–100.0)
PLATELETS: 162 10*3/uL (ref 150–400)
RBC: 4.29 MIL/uL (ref 4.22–5.81)
RDW: 13.7 % (ref 11.5–15.5)
WBC: 4.6 10*3/uL (ref 4.0–10.5)

## 2017-07-12 LAB — ETHANOL: Alcohol, Ethyl (B): <10 mg/dL (ref <10)

## 2017-07-12 LAB — I-STAT TROPONIN, ED: TROPONIN I, POC: 0.02 ng/mL (ref 0.00–0.08)

## 2017-07-12 LAB — I-STAT CHEM 8, ED
BUN: 19 mg/dL (ref 6–20)
CALCIUM ION: 1.16 mmol/L (ref 1.15–1.40)
CHLORIDE: 104 mmol/L (ref 101–111)
Creatinine, Ser: 1.1 mg/dL (ref 0.61–1.24)
Glucose, Bld: 91 mg/dL (ref 65–99)
HCT: 41 % (ref 39.0–52.0)
Hemoglobin: 13.9 g/dL (ref 13.0–17.0)
POTASSIUM: 4.7 mmol/L (ref 3.5–5.1)
SODIUM: 139 mmol/L (ref 135–145)
TCO2: 25 mmol/L (ref 22–32)

## 2017-07-12 LAB — MAGNESIUM: MAGNESIUM: 2 mg/dL (ref 1.7–2.4)

## 2017-07-12 MED ORDER — SODIUM CHLORIDE 0.9 % IV BOLUS
1000.0000 mL | Freq: Once | INTRAVENOUS | Status: AC
  Administered 2017-07-12: 1000 mL via INTRAVENOUS

## 2017-07-12 MED ORDER — STROKE: EARLY STAGES OF RECOVERY BOOK
Freq: Once | Status: DC
  Filled 2017-07-12: qty 1

## 2017-07-12 MED ORDER — SODIUM CHLORIDE 0.9 % IV SOLN: INTRAVENOUS | Status: DC

## 2017-07-12 MED ORDER — IOPAMIDOL (ISOVUE-370) INJECTION 76%
50.0000 mL | Freq: Once | INTRAVENOUS | Status: AC | PRN
  Administered 2017-07-12: 50 mL via INTRAVENOUS

## 2017-07-12 MED ORDER — ACETAMINOPHEN 325 MG PO TABS: 650.0000 mg | ORAL_TABLET | ORAL | Status: DC | PRN

## 2017-07-12 MED ORDER — ACETAMINOPHEN 650 MG RE SUPP: 650.0000 mg | RECTAL | Status: DC | PRN

## 2017-07-12 MED ORDER — ATORVASTATIN CALCIUM 40 MG PO TABS
40.0000 mg | ORAL_TABLET | Freq: Every day | ORAL | Status: DC
  Administered 2017-07-12: 40 mg via ORAL
  Filled 2017-07-12: qty 1

## 2017-07-12 MED ORDER — STUDY - AXIOMATIC STUDY - CLOPIDOGREL 75MG (PI-SETHI)
300.0000 mg | ORAL_TABLET | Freq: Once | ORAL | Status: AC
  Administered 2017-07-12: 300 mg via ORAL
  Filled 2017-07-12: qty 300

## 2017-07-12 MED ORDER — SENNOSIDES-DOCUSATE SODIUM 8.6-50 MG PO TABS: 1.0000 | ORAL_TABLET | Freq: Every evening | ORAL | Status: DC | PRN

## 2017-07-12 MED ORDER — ADULT MULTIVITAMIN W/MINERALS CH
1.0000 | ORAL_TABLET | Freq: Every day | ORAL | Status: DC
  Administered 2017-07-13: 1 via ORAL
  Filled 2017-07-12: qty 1

## 2017-07-12 MED ORDER — STUDY - AXIOMATIC STUDY - CLOPIDOGREL 75MG (PI-SETHI)
75.0000 mg | ORAL_TABLET | ORAL | Status: DC
  Administered 2017-07-13: 75 mg via ORAL
  Filled 2017-07-12 (×2): qty 75

## 2017-07-12 MED ORDER — IOPAMIDOL (ISOVUE-370) INJECTION 76%
INTRAVENOUS | Status: AC
  Filled 2017-07-12: qty 50

## 2017-07-12 MED ORDER — STUDY - AXIOMATIC STUDY - ASPIRIN 100 MG (PI-SETHI)
100.0000 mg | ORAL_TABLET | Freq: Every day | ORAL | Status: DC
  Administered 2017-07-12: 100 mg via ORAL
  Filled 2017-07-12 (×3): qty 100

## 2017-07-12 MED ORDER — STUDY - AXIOMATIC STUDY - BMS986177 OR PLACEBO (PI-SETHI)
4.0000 | ORAL_CAPSULE | Freq: Two times a day (BID) | ORAL | Status: DC
  Administered 2017-07-12 – 2017-07-13 (×2): 4 via ORAL
  Filled 2017-07-12 (×5): qty 4

## 2017-07-12 MED ORDER — ASPIRIN 81 MG PO CHEW
324.0000 mg | CHEWABLE_TABLET | Freq: Once | ORAL | Status: AC
  Administered 2017-07-12: 324 mg via ORAL
  Filled 2017-07-12: qty 4

## 2017-07-12 MED ORDER — ACETAMINOPHEN 160 MG/5ML PO SOLN: 650.0000 mg | ORAL | Status: DC | PRN

## 2017-07-12 MED ORDER — ASPIRIN 325 MG PO TABS: 325.0000 mg | ORAL_TABLET | Freq: Every day | ORAL | Status: DC

## 2017-07-12 MED ORDER — ENOXAPARIN SODIUM 40 MG/0.4ML ~~LOC~~ SOLN
40.0000 mg | SUBCUTANEOUS | Status: DC
  Filled 2017-07-12: qty 0.4

## 2017-07-12 NOTE — ED Notes (Signed)
Patient returned from MRI and placed back on monitor. Wife at bedside. Patient in no apparent distress.

## 2017-07-12 NOTE — ED Provider Notes (Signed)
[facility] EMERGENCY DEPARTMENT Provider Note   CSN: [id number] Arrival date & time: 07/12/17  1016     History   Chief Complaint Chief Complaint  Patient presents with  . Dizziness    HPI Robert Mclaughlin is a 72 y.o. male.  72 year old male presents with acute onset of ataxia just prior to arrival.  States that he was out playing golf and all of a sudden had trouble with his balance as well with some dizziness.  Denies any vertiginous component to it.  No recent ear pain or URI symptoms.  No severe headaches or emesis.  Denies any chest or abdominal discomfort.  No new medications.  Had a regular meal to eat today.  Called EMS and was transported here     Past Medical History:  Diagnosis Date  . Arthritis    "left knee" (04/11/2016)  . Carotid artery occlusion   . Dysrhythmia    iiregular heart beat yrs ago  . Migraine equivalent    "none in years" (04/11/2016)  . PFO (patent foramen ovale)    diagnosed 04/2006  . Stroke [facility]) 2008   denies residual on 04/11/2016    Patient Active Problem List   Diagnosis Date Noted  . Arthritis of knee 08/22/2016  . Primary osteoarthritis of left knee 04/28/2016  . PFO (patent foramen ovale) 04/11/2016  . Occlusion and stenosis of carotid artery without mention of cerebral infarction 07/24/2011    Past Surgical History:  Procedure Laterality Date  . MASS EXCISION Right 2017   "precancerous growth on my leg"  . MOUTH SURGERY     tooth extraction and root extraction   . PATENT FORAMEN OVALE CLOSURE  04/11/2016  . PATENT FORAMEN OVALE CLOSURE N/A 04/11/2016   Procedure: Patent Forament Ovale(PFO) Closure;  Surgeon: Sherren Mocha, MD;  Location: [facility];  Service: Cardiovascular;  Laterality: N/A;  . TONSILLECTOMY    . TOOTH EXTRACTION    . TOTAL KNEE ARTHROPLASTY Left 08/22/2016   Procedure: LEFT TOTAL KNEE ARTHROPLASTY;  Surgeon: Meredith Pel, MD;  Location: [facility];  Service: Orthopedics;   Laterality: Left;        Home Medications    Prior to Admission medications   Medication Sig Start Date End Date Taking? Authorizing Provider  ARTIFICIAL TEAR OP Place 1 drop into both eyes daily.     [provider]  aspirin EC 81 MG tablet Take 81 mg by mouth daily.    [provider]  diclofenac sodium (VOLTAREN) 1 % GEL Apply 1 application topically as needed. 11/02/16   [provider]  diclofenac sodium (VOLTAREN) 1 % GEL APPLY 1 APPLICATION TOPICALLY 4 (FOUR) TIMES DAILY AS NEEDED (MUSCLE PAIN). 11/30/16   Meredith Pel, MD  ibuprofen (ADVIL,MOTRIN) 400 MG tablet Take 400 mg by mouth as needed. 10/31/16   [provider]  simvastatin (ZOCOR) 20 MG tablet Take 20 mg by mouth at bedtime.     [provider]    Family History Family History  Problem Relation Age of Onset  . Heart disease Mother        Heart Disease before age 22  . Varicose Veins Mother   . Hyperlipidemia Father     Social History Social History   Tobacco Use  . Smoking status: Former Smoker    Packs/day: 1.00    Years: 6.00    Pack years: 6.00    Last attempt to quit: 03/13/1969    Years  since quitting: 48.3  . Smokeless tobacco: Never Used  Substance Use Topics  . Alcohol use: Yes    Alcohol/week: 8.4 oz    Types: 14 Cans of beer per week    Comment: 2 drinks per day  . Drug use: No     Allergies   Latanoprost and Lumigan [bimatoprost]   Review of Systems Review of Systems  All other systems reviewed and are negative.    Physical Exam Updated Vital Signs BP 134/71   Pulse (!) 52   Temp 97.6 F (36.4 C) (Oral)   Resp 16   SpO2 99%   Physical Exam  Constitutional: He is oriented to person, place, and time. He appears well-developed and well-nourished.  Non-toxic appearance. No distress.  HENT:  Head: Normocephalic and atraumatic.  Eyes: Pupils are equal, round, and reactive to light. Conjunctivae, EOM and lids are normal.  Neck:  Normal range of motion. Neck supple. No tracheal deviation present. No thyroid mass present.  Cardiovascular: Normal rate, regular rhythm and normal heart sounds. Exam reveals no gallop.  No murmur heard. Pulmonary/Chest: Effort normal and breath sounds normal. No stridor. No respiratory distress. He has no decreased breath sounds. He has no wheezes. He has no rhonchi. He has no rales.  Abdominal: Soft. Normal appearance and bowel sounds are normal. He exhibits no distension. There is no tenderness. There is no rebound and no CVA tenderness.  Musculoskeletal: Normal range of motion. He exhibits no edema or tenderness.  Neurological: He is alert and oriented to person, place, and time. He has normal strength. No cranial nerve deficit or sensory deficit. Gait abnormal. GCS eye subscore is 4. GCS verbal subscore is 5. GCS motor subscore is 6.  Skin: Skin is warm and dry. No abrasion and no rash noted.  Psychiatric: He has a normal mood and affect. His speech is normal and behavior is normal.  Nursing note and vitals reviewed.    ED Treatments / Results  Labs (all labs ordered are listed, but only abnormal results are displayed) Labs Reviewed  CBC - Abnormal; Notable for the following components:      Result Value   HCT 38.5 (*)    All other components within normal limits  BASIC METABOLIC PANEL  URINALYSIS, ROUTINE W REFLEX MICROSCOPIC  ETHANOL  PROTIME-INR  APTT  DIFFERENTIAL  RAPID URINE DRUG SCREEN, HOSP PERFORMED  I-STAT CHEM 8, ED  I-STAT TROPONIN, ED    EKG EKG Interpretation  Date/Time:  Thursday Jul 12 2017 10:22:10 EDT Ventricular Rate:  65 PR Interval:    QRS Duration: 107 QT Interval:  441 QTC Calculation: 459 R Axis:   -38 Text Interpretation:  Sinus rhythm Multiple ventricular premature complexes Left axis deviation Low voltage, precordial leads RSR' in V1 or V2, right VCD or RVH Confirmed by Lacretia Leigh ([id number]) on 07/12/2017 10:40:52 AM   Radiology No results  found.  Procedures Procedures (including critical care time)  Medications Ordered in ED Medications - No data to display   Initial Impression / Assessment and Plan / ED Course  I have reviewed the triage vital signs and the nursing notes.  Pertinent labs & imaging results that were available during my care of the patient were reviewed by me and considered in my medical decision making (see chart for details).     Patient is current symptoms concern for possible cerebellar stroke.  Discussed with Dr. Leonel Ramsay from neurology who agrees with plan to MRI of brain to rule out stroke.  Patient's MRI shows acute infarct in the cerebellum and left parietal lobe.  Will inform Dr. Leonel Ramsay as well as consult hospitalist for admission Final Clinical Impressions(s) / ED Diagnoses   Final diagnoses:  None    ED Discharge Orders    None       Lacretia Leigh, MD 07/12/17 1409

## 2017-07-12 NOTE — ED Notes (Signed)
Neurologist at bedside. 

## 2017-07-12 NOTE — ED Notes (Signed)
Patient transported to MRI 

## 2017-07-12 NOTE — ED Notes (Signed)
Patient transported to x-ray. ?

## 2017-07-12 NOTE — ED Triage Notes (Signed)
Pt arrives via [facility], was playing golf, went to get out of golf cart and had sudden onset of dizziness. Pt denies cp,sob, n/v or diaphoresis. Pt alert and oriented, speech clear, face symmetrical, strength equal bilaterally, no neuro deficits noted. Nad. Pt had irregular Heartbeat per ems ekg, reports his doctor has mentioned irregular heartbeat in the past.

## 2017-07-12 NOTE — Consult Note (Signed)
Neurology Consultation Reason for Consult: Unsteadiness Referring Physician: Zenia Resides, a  CC: Unsteadiness  History is obtained from: Patient  HPI: Robert Mclaughlin is a 72 y.o. male with a history of previous stroke felt to be possibly due to PFO which was closed previously.  He presents with sudden onset unsteadiness while playing golf earlier today.  He states that he was sitting in his golf cart, and then he stood up and felt very unsteady.  He was brought into the emergency department where Dr. Zenia Resides had him ambulate he was unsteady but able to walk.  His symptoms are rapidly improving and therefore he is not considered an IV TPA candidate.  MRI was performed which does show small cerebellar infarct.   LKW: 9:30 AM tpa given?: no, outside of window Premorbid modified rankin scale: 0 NIHSS: 0  ROS: A 14 point ROS was performed and is negative except as noted in the HPI.   Past Medical History:  Diagnosis Date  . Arthritis    "left knee" (04/11/2016)  . Carotid artery occlusion   . Dysrhythmia    iiregular heart beat yrs ago  . Migraine equivalent    "none in years" (04/11/2016)  . PFO (patent foramen ovale)     dx 2008; closed 03/2016 by Dr Burt Knack  . Stroke [facility]) 2008   denies residual on 04/11/2016     Family History  Problem Relation Age of Onset  . Heart disease Mother        Heart Disease before age 66  . Varicose Veins Mother   . Hyperlipidemia Father      Social History:  reports that he quit smoking about 48 years ago. He has a 6.00 pack-year smoking history. He has never used smokeless tobacco. He reports that he drinks about 8.4 oz of alcohol per week. He reports that he does not use drugs.   Exam: Current vital signs: BP 130/83   Pulse 70   Temp 97.6 F (36.4 C)   Resp 20   Ht 5\' 10"  (1.778 m)   Wt 75.8 kg (167 lb)   SpO2 99%   BMI 23.96 kg/m  Vital signs in last 24 hours: Temp:  [97.6 F (36.4 C)] 97.6 F (36.4 C) (05/02 1444) Pulse Rate:   [52-88] 70 (05/02 1800) Resp:  [14-22] 20 (05/02 1800) BP: (113-157)/(57-92) 130/83 (05/02 1800) SpO2:  [97 %-100 %] 99 % (05/02 1800) Weight:  [75.8 kg (167 lb)] 75.8 kg (167 lb) (05/02 1423)   Physical Exam  Constitutional: Appears well-developed and well-nourished.  Psych: Affect appropriate to situation Eyes: No scleral injection HENT: No OP obstrucion Head: Normocephalic.  Cardiovascular: Normal rate and regular rhythm.  Respiratory: Effort normal, non-labored breathing GI: Soft.  No distension. There is no tenderness.  Skin: WDI  Neuro: Mental Status: Patient is awake, alert, oriented to person, place, month, year, and situation. Patient is able to give a clear and coherent history. No signs of aphasia or neglect Cranial Nerves: II: Visual Fields are full. Pupils are equal, round, and reactive to light.   III,IV, VI: EOMI without ptosis or diploplia.  V: Facial sensation is symmetric to temperature VII: Facial movement is symmetric.  VIII: hearing is intact to voice X: Uvula elevates symmetrically XI: Shoulder shrug is symmetric. XII: tongue is midline without atrophy or fasciculations.  Motor: Tone is normal. Bulk is normal. 5/5 strength was present in all four extremities.  Sensory: Sensation is symmetric to light touch and temperature in the arms  and legs. Deep Tendon Reflexes: 2+ and symmetric in the biceps and patellae.  Plantars: Toes are downgoing bilaterally.  Cerebellar: FNF and HKS are intact bilaterally      I have reviewed labs in epic and the results pertinent to this consultation are: BMP-unremarkable  I have reviewed the images obtained: MRI brain-small cerebellar infarct  Impression: 72 year old male with small cerebellar infarct, potentially embolic, artery to artery or cardioembolic.  He will need further evaluation with vascular imaging and cardiac work-up.  Recommendations: 1. HgbA1c, fasting lipid panel 2.  CTA head neck 3. Frequent  neuro checks 4. Echocardiogram 5.  Are not needed given CTA 6. Prophylactic therapy-Antiplatelet med: Aspirin - dose 325mg  PO or 300mg  PR 7. Risk factor modification 8. Telemetry monitoring 9. PT consult, OT consult, Speech consult 10. please page stroke NP  Or  PA  Or MD  from 8am -4 pm as this patient will be followed by the stroke team at this point.   You can look them up on [email or web address]      Roland Rack, MD [facility] [phone]  If 7pm- 7am, please page neurology on call as listed in [place].

## 2017-07-12 NOTE — H&P (Signed)
History and Physical    Robert Mclaughlin LGX:[id number] DOB: [birth year] DOA: 07/12/2017  PCP: Lawerance Cruel, MD  [place]  I have personally briefly reviewed patient's old medical records in [place]  Chief Complaint: Dizziness and ataxia while playing golf this morning  HPI: Robert Mclaughlin is a 72 y.o. male with medical history significant of left knee osteoarthritis status post left total knee replacement in June 2018, repair of patent foramen ovale in January 2018 and a stroke due to the embolism of the middle cerebral artery in 2007 who presents today with complaints of ataxia and dizziness.  The patient was enjoying a wonderful round of golf in which she was having a very good game and then developed ataxia and dizziness.  Despite wanting to stay on the golf course he thought it better to get it looked into and presented to our emergency department via EMS.  He had no vertiginous component.  He has had no recent ear pain or upper respiratory symptoms.  He denies any headaches or emesis.  He did have a history of migraines in the distant past but none in years.  He has had no nausea or emesis associated with this.  He denies any chest pain or abdominal discomfort.  He has had no new medications.  He ate a regular breakfast this morning.  ED Course: MRI was obtained that showed a cerebellar and parietal lobe.  NIHSS stroke scale was 1.  Therefore thrombolytics were not indicated.  Neurology was consulted as was I for evaluation and admission.  Review of Systems: As per HPI otherwise all other systems reviewed and  negative.   Past Medical History:  Diagnosis Date  . Arthritis    "left knee" (04/11/2016)  . Carotid artery occlusion   . Dysrhythmia    iiregular heart beat yrs ago  . Migraine equivalent    "none in years" (04/11/2016)  . PFO (patent foramen ovale)     dx 2008; closed 03/2016 by Dr Burt Knack  . Stroke [facility]) 2008   denies  residual on 04/11/2016    Past Surgical History:  Procedure Laterality Date  . MASS EXCISION Right 2017   "precancerous growth on my leg"  . MOUTH SURGERY     tooth extraction and root extraction   . PATENT FORAMEN OVALE CLOSURE  04/11/2016  . PATENT FORAMEN OVALE CLOSURE N/A 04/11/2016   Procedure: Patent [place](PFO) Closure;  Surgeon: Sherren Mocha, MD;  Location: [place] [facility];  Service: Cardiovascular;  Laterality: N/A;  . TONSILLECTOMY    . TOOTH EXTRACTION    . TOTAL KNEE ARTHROPLASTY Left 08/22/2016   Procedure: LEFT TOTAL KNEE ARTHROPLASTY;  Surgeon: Meredith Pel, MD;  Location: [place];  Service: Orthopedics;  Laterality: Left;   Social History   Social History Narrative  . Not on file    reports that he quit smoking about 48 years ago. He has a 6.00 pack-year smoking history. He has never used smokeless tobacco. He reports that he drinks about 8.4 oz of alcohol per week. He reports that he does not use drugs.  Allergies  Allergen Reactions  . Latanoprost Other (See Comments)    Ineffective   . Lumigan [Bimatoprost] Other (See Comments)    Ineffective    Family History  Problem Relation Age of Onset  . Heart disease Mother        Heart Disease before age 37  . Varicose Veins Mother   .  Hyperlipidemia Father      Prior to Admission medications   Medication Sig Start Date End Date Taking? Authorizing Provider  ARTIFICIAL TEAR OP Place 1 drop into both eyes daily.     [provider]  aspirin EC 81 MG tablet Take 81 mg by mouth daily.    [provider]  diclofenac sodium (VOLTAREN) 1 % GEL APPLY 1 APPLICATION TOPICALLY 4 (FOUR) TIMES DAILY AS NEEDED (MUSCLE PAIN). 11/30/16   Meredith Pel, MD  ibuprofen (ADVIL,MOTRIN) 400 MG tablet Take 400 mg by mouth as needed. 10/31/16   [provider]  simvastatin (ZOCOR) 20 MG tablet Take 20 mg by mouth at bedtime.     [provider]    Physical  Exam: Constitutional: NAD, calm, comfortable Vitals:   07/12/17 1415 07/12/17 1423 07/12/17 1444 07/12/17 1500  BP:  (!) 149/85  (!) 157/79  Pulse: 79 76    Resp: 19 (!) 22  15  Temp:   97.6 F (36.4 C)   TempSrc:      SpO2: 98% 99%  100%  Weight:  75.8 kg (167 lb)    Height:  5\' 10"  (1.778 m)     Eyes: PERRL, lids and conjunctivae normal ENMT: Mucous membranes are moist. Posterior pharynx clear of any exudate or lesions.Normal dentition.  Neck: normal, supple, no masses, no thyromegaly Respiratory: clear to auscultation bilaterally, no wheezing, no crackles. Normal respiratory effort. No accessory muscle use.  Cardiovascular: Regular rate and rhythm, no murmurs / rubs / gallops. No extremity edema. 2+ pedal pulses. No carotid bruits.  Abdomen: no tenderness, no masses palpated. No hepatosplenomegaly. Bowel sounds positive.  Musculoskeletal: no clubbing / cyanosis. No joint deformity upper and lower extremities. Good ROM, no contractures. Normal muscle tone.  Skin: no rashes, lesions, ulcers. No induration Neurologic: CN 2-12 grossly intact. Sensation intact, DTR normal. Strength 5/5 in all 4.  Psychiatric: Normal judgment and insight. Alert and oriented x 3. Normal mood.   Labs on Admission: I have personally reviewed following labs and imaging studies  CBC: Recent Labs  Lab 07/12/17 1036 07/12/17 1135  WBC 4.6  --   NEUTROABS 3.1  --   HGB 13.0 13.9  HCT 38.5* 41.0  MCV 89.7  --   PLT 162  --    Basic Metabolic Panel: Recent Labs  Lab 07/12/17 1036 07/12/17 1135  NA 137 139  K 4.1 4.7  CL 105 104  CO2 24  --   GLUCOSE 92 91  BUN 16 19  CREATININE 1.09 1.10  CALCIUM 8.6*  --    GFR: Estimated Creatinine Clearance: 62.7 mL/min (by C-G formula based on SCr of 1.1 mg/dL).  Recent Labs  Lab 07/12/17 1108  INR 1.12   Urine analysis:    Component Value Date/Time   COLORURINE YELLOW 07/12/2017 1103   APPEARANCEUR CLEAR 07/12/2017 1103   LABSPEC 1.019  07/12/2017 1103   PHURINE 5.0 07/12/2017 1103   GLUCOSEU NEGATIVE 07/12/2017 1103   HGBUR NEGATIVE 07/12/2017 1103   BILIRUBINUR NEGATIVE 07/12/2017 1103   KETONESUR 5 (A) 07/12/2017 1103   PROTEINUR NEGATIVE 07/12/2017 1103   NITRITE NEGATIVE 07/12/2017 1103   LEUKOCYTESUR NEGATIVE 07/12/2017 1103    Radiological Exams on Admission: Mr Brain Wo Contrast (neuro Protocol)  Result Date: 07/12/2017 CLINICAL DATA:  Ataxia.  Sudden onset dizziness while playing golf. EXAM: MRI HEAD WITHOUT CONTRAST TECHNIQUE: Multiplanar, multiecho pulse sequences of the brain and surrounding structures were obtained without intravenous contrast. COMPARISON:  04/18/2006  FINDINGS: Brain: A 13 x 5 mm acute infarct is present in the superior cerebellum just left of midline. There is also a punctate 1-2 mm focus of trace diffusion signal abnormality involving left parietal cortex (series 5001, image 65) with assessment for reduced ADC limited by its small size though also most likely reflecting an acute infarct. No intracranial hemorrhage, mass, midline shift, or extra-axial fluid collection is identified. Small chronic infarcts are present in the cerebellum bilaterally and have increased in number from 2008. A chronic posterior left temporal lobe infarct was acute on the prior study. T2 hyperintensities in the periventricular white matter have progressed and are nonspecific but compatible with mild chronic small vessel ischemic disease. Mild cerebral atrophy is within normal limits for age. Vascular: Major intracranial vascular flow voids are preserved. Skull and upper cervical spine: Unremarkable bone marrow signal. Sinuses/Orbits: Unremarkable orbits. Complete left sphenoid sinus opacification by mucosal thickening and complex material centrally. Minimal right maxillary sinus mucosal thickening. Trace left mastoid effusion. Other: None. IMPRESSION: 1. Small acute infarcts in the cerebellum and left parietal lobe. 2. Chronic  bilateral cerebellar infarcts, increased from 2008. 3. Mild chronic small vessel ischemic disease in the cerebral white matter, increased from 2008. Electronically Signed   By: Logan Bores M.D.   On: 07/12/2017 14:03    EKG: Independently reviewed.  Sinus rhythm with PACs and PVCs unchanged from March 01, 2016  Assessment/Plan Principal Problem:   Cerebellar stroke [facility]) Active Problems:   Orthostatic hypotension   Stroke due to embolism of left middle cerebral artery (HCC)   PVC (premature ventricular contraction)   PFO (patent foramen ovale)   Primary osteoarthritis of left knee    1.  Cerebellar stroke: Patient with new cerebellar stroke with areas that appeared to be multiple suggesting of embolic origin.  His stroke scale is relatively low and does not require any thrombolytics fortunately.  His blood pressure has been relatively stable however somewhat labile with orthostasis please see below.  Patient will be admitted into the hospital he will receive aspirin full dose and I will change his simvastatin to atorvastatin 40 mg daily.  As is the patient's second stroke.  He was previously on an 81 mg a day aspirin.  Neurology has been consulted and will see the patient in consultation.  We will check an echocardiogram as well as carotid Dopplers.  Patient had been followed by Dr. Trula Slade from vascular surgery for his carotids and in 2015 had a 40% left posterior internal carotid artery stenosis but by 2017 his carotid arteries were read as no bilateral carotid stenoses and follow-up had been decreased to yearly.  Will have PT OT and ST see the patient as well.  2.  Orthostatic hypotension: Ablations blood pressure went from 149/60 lying down to 124/50 standing up without a significant pulse change.  When he lie down again I rechecked his blood pressure and he had gone as far as 158/78 and his pulse went down from 104 to 60.  He may have some mild dehydration I am going to hydrate him  overnight and give him a small bolus of IV fluids today.  We will recheck orthostatics in a.m.  3.  Remote stroke due to embolism of left middle cerebral artery: Patient's MRI shows his old left temporal stroke.  See remarks above.  4.  PVCs: Patient with multiple premature ventricular contractions noted both by his primary care doctor and here in the emergency department on EKG.  These were also  present in 2017.  I am not sure if these correlate to embolic events will wait and see results of echocardiogram.  5.  History of patent foramen ovale noted in 2008.  In January 2018 this was repaired.  Dr. Burt Knack was the operating cardiologist.  6.  Primary osteoarthritis of left knee: Patient is status post a left total knee replacement.  He exercises regularly.    DVT prophylaxis: Lovenox Code Status: Full code Family Communication: Talk with patient's wife who is present at the time of admission Disposition Plan: Likely home in 24 to 40 hours Consults called: Neurology Dr. Malen Gauze Admission status: Inpatient due to acute stroke   Lady Deutscher MD [facility] Pager [phone]  If 7PM-7AM, please contact night-coverage [email or web address] Password [facility]  07/12/2017, 3:29 PM

## 2017-07-12 NOTE — Progress Notes (Addendum)
Pt enrolled in clinical study. Orders for testing for study put in chart per study protocol.  -- Amie Portland, MD [facility] Pager: [phone] If 7pm to 7am, please call on call as listed on AMION.

## 2017-07-13 ENCOUNTER — Inpatient Hospital Stay (HOSPITAL_COMMUNITY): Payer: Medicare Other

## 2017-07-13 ENCOUNTER — Encounter (HOSPITAL_COMMUNITY): Payer: Medicare Other

## 2017-07-13 DIAGNOSIS — I340 Nonrheumatic mitral (valve) insufficiency: Secondary | ICD-10-CM | POA: Diagnosis not present

## 2017-07-13 DIAGNOSIS — Q211 Atrial septal defect: Secondary | ICD-10-CM | POA: Diagnosis not present

## 2017-07-13 DIAGNOSIS — Z8673 Personal history of transient ischemic attack (TIA), and cerebral infarction without residual deficits: Secondary | ICD-10-CM | POA: Diagnosis not present

## 2017-07-13 DIAGNOSIS — I493 Ventricular premature depolarization: Secondary | ICD-10-CM

## 2017-07-13 DIAGNOSIS — I63442 Cerebral infarction due to embolism of left cerebellar artery: Secondary | ICD-10-CM | POA: Diagnosis not present

## 2017-07-13 DIAGNOSIS — M1712 Unilateral primary osteoarthritis, left knee: Secondary | ICD-10-CM | POA: Diagnosis not present

## 2017-07-13 DIAGNOSIS — I639 Cerebral infarction, unspecified: Secondary | ICD-10-CM | POA: Diagnosis not present

## 2017-07-13 DIAGNOSIS — R29701 NIHSS score 1: Secondary | ICD-10-CM | POA: Diagnosis not present

## 2017-07-13 DIAGNOSIS — Z96652 Presence of left artificial knee joint: Secondary | ICD-10-CM | POA: Diagnosis not present

## 2017-07-13 DIAGNOSIS — I951 Orthostatic hypotension: Secondary | ICD-10-CM

## 2017-07-13 LAB — LIPID PANEL
CHOL/HDL RATIO: 2.3 ratio
CHOLESTEROL: 108 mg/dL (ref 0–200)
HDL: 46 mg/dL (ref >40)
LDL CALC: 52 mg/dL (ref 0–99)
Triglycerides: 51 mg/dL (ref <150)
VLDL: 10 mg/dL (ref 0–40)

## 2017-07-13 LAB — HEMOGLOBIN A1C
Hgb A1c MFr Bld: 5.4 % (ref 4.8–5.6)
Mean Plasma Glucose: 108.28 mg/dL

## 2017-07-13 LAB — ECHOCARDIOGRAM COMPLETE
HEIGHTINCHES: 70 in
Weight: 2723.12 oz

## 2017-07-13 NOTE — Progress Notes (Signed)
SLP Cancellation Note  Patient Details Name: JCEON ALVERIO MRN: [id number] DOB: 23-Jun-1945   Cancelled treatment:       Reason Eval/Treat Not Completed: Patient at procedure or test/unavailable; transport just arrived to take pt for bubble test   Juan Quam Laurice 07/13/2017, 2:18 PM

## 2017-07-13 NOTE — Progress Notes (Signed)
    [facility] has been requested to perform a transesophageal echocardiogram on Melonie [place] for CVA.  After careful review of history and examination, the risks and benefits of transesophageal echocardiogram have been explained including risks of esophageal damage, perforation (1:10,000 risk), bleeding, pharyngeal hematoma as well as other potential complications associated with conscious sedation including aspiration, arrhythmia, respiratory failure and death. Alternatives to treatment were discussed, questions were answered. Patient is willing to proceed.   Lyda Jester, PA-C  07/13/2017 3:53 PM

## 2017-07-13 NOTE — Progress Notes (Signed)
Discharge instructions given. Pt verbalized understanding and all questions were answered.  

## 2017-07-13 NOTE — Discharge Summary (Signed)
Physician Discharge Summary  Robert Mclaughlin AYT:[id number] DOB: 18-Feb-1946 DOA: 07/12/2017  PCP: Lawerance Cruel, MD  Admit date: 07/12/2017 Discharge date: 07/13/2017  Admitted From: Home Disposition: Home   Recommendations for Outpatient Follow-up:  1. Follow up with PCP in 1-2 weeks 2. Neurology will supply medications and follow up per AXIOMATIC stroke prevention trial protocol.  Home Health: None Equipment/Devices: None Discharge Condition: Stable CODE STATUS: Full Diet recommendation: Heart healthy  Brief/Interim Summary: Robert Mclaughlin is a 72 y.o. male with a history of embolic CVA w/PFO in 3557 s/p closure in 2018 prior to left TKA who presented to the ED with unsteadiness while playing golf. He denied falling or focal neurological deficits. Symptoms were improving in ED, so not considered TPA candidate. MRI brain confirmed small paramedian cerebellar and left parietal infarcts. He was admitted for stroke work up and enrolled in clinical trial involving standard of care + oral factor XI inhibitor (or placebo). Due to concern for ongoing embolic CVAs, bubble study was performed to evaluate PFO closure. TEE and implanted loop recorder placement are also scheduled, though these will be performed as an outpatient.   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 HbA1c 5.4%.  - Echocardiogram without CES, Amplatzer occluder in place with no residual color flow, bubble study with trivial high intensity transient signals heard at rest and with  valsalva maneuver, suggestive of clinically insignificant residual patent foramen ovale.  - Plan TEE, ILR next week as outpatient. Hx PVCs but no captured AFib.  - Per PT/OT, deficits resolved, no further follow up indicated.   Orthostatic hypotension: IV hydration overnight, symptoms resolved.   History of CVA, PFO closure: Noted as above  Left knee OA s/p TKA doing well.   Discharge Instructions Discharge Instructions    Diet - low sodium heart healthy   Complete by:  As directed    Discharge instructions   Complete by:  As directed    You will be taking study-provided medications and will need to follow up with neurology as directed. You will be contacted to schedule this.   You will need a TEE and loop recorder placement, though this will be arranged as an outpatient.   If your symptoms return, seek medical attention right away.   Increase activity slowly   Complete by:  As directed      Allergies as of 07/13/2017      Reactions   Latanoprost Other (See Comments)   Ineffective    Lumigan [bimatoprost] Other (See Comments)   Ineffective      Medication List    STOP taking these medications   aspirin EC 81 MG tablet     TAKE these medications   ARTIFICIAL TEAR OP Place 1 drop into both eyes daily.   b complex vitamins tablet Take 1 tablet by mouth at bedtime.   carbamide peroxide 6.5 % OTIC solution Commonly known as:  DEBROX Place 5 drops into the left ear as needed.   diclofenac sodium 1 % Gel Commonly known as:  VOLTAREN APPLY 1 APPLICATION  TOPICALLY 4 (FOUR) TIMES DAILY AS NEEDED (MUSCLE PAIN).   ibuprofen 400 MG tablet Commonly known as:  ADVIL,MOTRIN Take 400 mg by mouth as needed.   multivitamin capsule Take 1 capsule by mouth daily.   simvastatin 20 MG tablet Commonly known as:  ZOCOR Take 20 mg by mouth at bedtime.      Follow-up Information    Lawerance Cruel, MD Follow up.   Specialty:  Family Medicine Contact information: [address] [place] [address] [phone]        Garvin Fila, MD. Schedule an appointment as soon as possible for a visit in 3 week(s).   Specialties:  Neurology, Radiology Why:  Call if you are not contacted in the next week. Contact information: [address] [place] [address] [phone]        [facility] Follow up on 07/16/2017.   Why:  Please arrive at 11:00 AM (2 hrs before procedure), Transesophageal ultrasound is scheduled for 1:00 PM. No not eat or drink past midnight.  Contact information: [place] [phone]         Allergies  Allergen Reactions  . Latanoprost Other (See Comments)    Ineffective   . Lumigan [Bimatoprost] Other (See Comments)    Ineffective    Consultations:  Neurology, Dr. Leonie Man  Procedures/Studies: Ct Angio Head W Or Wo Contrast  Result Date: 07/12/2017 CLINICAL DATA:  72 y/o  M; stroke for follow-up. EXAM: CT ANGIOGRAPHY HEAD AND NECK TECHNIQUE: Multidetector CT imaging of the head and neck was performed using the standard protocol during bolus administration of intravenous contrast. Multiplanar CT image reconstructions and MIPs were obtained to evaluate the vascular anatomy. Carotid stenosis measurements (when applicable) are obtained utilizing NASCET criteria, using the distal internal carotid diameter as the denominator. CONTRAST:  38mL ISOVUE-370 IOPAMIDOL (ISOVUE-370) INJECTION 76% COMPARISON:  07/12/2017 MRI head.  04/18/2006 MRA of head and neck. FINDINGS: CT HEAD FINDINGS Brain: Small acute infarct within left paramedian superior cerebellum again seen. Infarct in left parietal lobe poorly visualized on CT. Small chronic infarct in left posterior inferior temporal lobe. Small chronic lacunar infarcts in left cerebellum. Punctate density in right inferior cerebellum (series 7, image 18). Otherwise no new acute stroke, hemorrhage, or focal mass effect.  Vascular: As below. Skull: Normal. Negative for fracture or focal lesion. Sinuses: Opacification of the sphenoid sinus.  Otherwise negative. Orbits: No acute finding. Review of the MIP images confirms the above findings CTA NECK FINDINGS Aortic arch: Standard branching. Imaged portion shows no evidence of aneurysm or dissection. No significant stenosis of the major arch vessel origins. Right carotid system: No evidence of dissection, stenosis (50% or greater) or occlusion. Left carotid system: No evidence of dissection, stenosis (50% or greater) or occlusion. Mild calcific atherosclerosis of left carotid bifurcation with minimal less than 30% proximal ICA stenosis. Vertebral arteries: Left dominant. No evidence of dissection, stenosis (50% or greater) or occlusion. Skeleton: Mild cervical spondylosis. No high-grade bony canal stenosis. Other neck: Negative. Upper chest: Negative. Review of the MIP images confirms the above findings CTA HEAD FINDINGS Anterior circulation: No significant stenosis, proximal occlusion, aneurysm, or vascular malformation. Mild calcific atherosclerosis of the carotid siphons without stenosis. Posterior circulation: No significant stenosis, proximal occlusion, aneurysm, or vascular malformation. Venous sinuses: As permitted by contrast timing, patent. Anatomic variants: Complete circle-of-Willis. Delayed phase: No abnormal intracranial enhancement. Review of the MIP images confirms the above findings IMPRESSION: CT  head: 1. Small acute infarct in left paramedian superior cerebellar hemisphere again seen. Punctate left parietal infarct not visualized on CT. 2. Punctate density in right inferior cerebellum without signal abnormality on MR, possibly mineralization or petechial hemorrhage. 3. Otherwise no acute infarct, hemorrhage, or mass effect identified. CTA neck: 1. Patent carotid and vertebral arteries. No dissection, aneurysm, or hemodynamically significant stenosis utilizing NASCET  criteria. CTA head: 1. Patent anterior and posterior intracranial circulation. No large vessel occlusion, aneurysm, or significant stenosis. These results will be called to the ordering clinician or representative by the Radiologist Assistant, and communication documented in the PACS or zVision Dashboard. Electronically Signed   By: Kristine Garbe M.D.   On: 07/12/2017 19:28   Dg Chest 2 View  Result Date: 07/12/2017 CLINICAL DATA:  Dizziness, difficult gait, stroke today, former smoking history EXAM: CHEST - 2 VIEW COMPARISON:  Chest x-ray of 08/11/2016 FINDINGS: No active infiltrate or effusion. Mediastinal and hilar contours are unremarkable. The heart is within normal limits in size. Closure device for closure of patent foramen ovale is noted. There are mild degenerative changes in the thoracic spine. IMPRESSION: No active cardiopulmonary disease. Closure device for previous patent foramen ovale is noted. Electronically Signed   By: Ivar Drape M.D.   On: 07/12/2017 16:48   Ct Angio Neck W Or Wo Contrast  Result Date: 07/12/2017 CLINICAL DATA:  72 y/o  M; stroke for follow-up. EXAM: CT ANGIOGRAPHY HEAD AND NECK TECHNIQUE: Multidetector CT imaging of the head and neck was performed using the standard protocol during bolus administration of intravenous contrast. Multiplanar CT image reconstructions and MIPs were obtained to evaluate the vascular anatomy. Carotid stenosis measurements (when applicable) are obtained utilizing NASCET criteria, using the distal internal carotid diameter as the denominator. CONTRAST:  53mL ISOVUE-370 IOPAMIDOL (ISOVUE-370) INJECTION 76% COMPARISON:  07/12/2017 MRI head.  04/18/2006 MRA of head and neck. FINDINGS: CT HEAD FINDINGS Brain: Small acute infarct within left paramedian superior cerebellum again seen. Infarct in left parietal lobe poorly visualized on CT. Small chronic infarct in left posterior inferior temporal lobe. Small chronic lacunar infarcts in left  cerebellum. Punctate density in right inferior cerebellum (series 7, image 18). Otherwise no new acute stroke, hemorrhage, or focal mass effect. Vascular: As below. Skull: Normal. Negative for fracture or focal lesion. Sinuses: Opacification of the sphenoid sinus.  Otherwise negative. Orbits: No acute finding. Review of the MIP images confirms the above findings CTA NECK FINDINGS Aortic arch: Standard branching. Imaged portion shows no evidence of aneurysm or dissection. No significant stenosis of the major arch vessel origins. Right carotid system: No evidence of dissection, stenosis (50% or greater) or occlusion. Left carotid system: No evidence of dissection, stenosis (50% or greater) or occlusion. Mild calcific atherosclerosis of left carotid bifurcation with minimal less than 30% proximal ICA stenosis. Vertebral arteries: Left dominant. No evidence of dissection, stenosis (50% or greater) or occlusion. Skeleton: Mild cervical spondylosis. No high-grade bony canal stenosis. Other neck: Negative. Upper chest: Negative. Review of the MIP images confirms the above findings CTA HEAD FINDINGS Anterior circulation: No significant stenosis, proximal occlusion, aneurysm, or vascular malformation. Mild calcific atherosclerosis of the carotid siphons without stenosis. Posterior circulation: No significant stenosis, proximal occlusion, aneurysm, or vascular malformation. Venous sinuses: As permitted by contrast timing, patent. Anatomic variants: Complete circle-of-Willis. Delayed phase: No abnormal intracranial enhancement. Review of the MIP images confirms the above findings IMPRESSION: CT head: 1. Small acute infarct in left paramedian superior cerebellar hemisphere again seen. Punctate left parietal  infarct not visualized on CT. 2. Punctate density in right inferior cerebellum without signal abnormality on MR, possibly mineralization or petechial hemorrhage. 3. Otherwise no acute infarct, hemorrhage, or mass effect  identified. CTA neck: 1. Patent carotid and vertebral arteries. No dissection, aneurysm, or hemodynamically significant stenosis utilizing NASCET criteria. CTA head: 1. Patent anterior and posterior intracranial circulation. No large vessel occlusion, aneurysm, or significant stenosis. These results will be called to the ordering clinician or representative by the Radiologist Assistant, and communication documented in the PACS or zVision Dashboard. Electronically Signed   By: Kristine Garbe M.D.   On: 07/12/2017 19:28   Mr Brain Wo Contrast  Result Date: 07/12/2017 CLINICAL DATA:  Stroke follow-up.  BMS trial protocol baseline. EXAM: MRI HEAD WITHOUT CONTRAST TECHNIQUE: Multiplanar, multiecho pulse sequences of the brain and surrounding structures were obtained without intravenous contrast. COMPARISON:  Brain MRI 07/12/2017 at 12:58 p.m. FINDINGS: BRAIN: The midline structures are normal. There is no acute infarct or acute hemorrhage. No mass lesion, hydrocephalus, dural abnormality or extra-axial collection. Multifocal white matter hyperintensity, most commonly due to chronic ischemic microangiopathy. There is bilateral basal ganglia mineralization. No age-advanced or lobar predominant atrophy. No chronic microhemorrhage or superficial siderosis. VASCULAR: Major intracranial arterial and venous sinus flow voids are preserved. SKULL AND UPPER CERVICAL SPINE: The visualized skull base, calvarium, upper cervical spine and extracranial soft tissues are normal. SINUSES/ORBITS: No fluid levels or advanced mucosal thickening. No mastoid or middle ear effusion. Normal orbits. IMPRESSION: 1. Redemonstration of small acute infarct of the paramedian superior left cerebellar hemisphere 2. Multiple small, old bilateral cerebellar infarcts. Electronically Signed   By: Ulyses Jarred M.D.   On: 07/12/2017 23:00   Mr Brain Wo Contrast (neuro Protocol)  Result Date: 07/12/2017 CLINICAL DATA:  Ataxia.  Sudden onset  dizziness while playing golf. EXAM: MRI HEAD WITHOUT CONTRAST TECHNIQUE: Multiplanar, multiecho pulse sequences of the brain and surrounding structures were obtained without intravenous contrast. COMPARISON:  04/18/2006 FINDINGS: Brain: A 13 x 5 mm acute infarct is present in the superior cerebellum just left of midline. There is also a punctate 1-2 mm focus of trace diffusion signal abnormality involving left parietal cortex (series 5001, image 65) with assessment for reduced ADC limited by its small size though also most likely reflecting an acute infarct. No intracranial hemorrhage, mass, midline shift, or extra-axial fluid collection is identified. Small chronic infarcts are present in the cerebellum bilaterally and have increased in number from 2008. A chronic posterior left temporal lobe infarct was acute on the prior study. T2 hyperintensities in the periventricular white matter have progressed and are nonspecific but compatible with mild chronic small vessel ischemic disease. Mild cerebral atrophy is within normal limits for age. Vascular: Major intracranial vascular flow voids are preserved. Skull and upper cervical spine: Unremarkable bone marrow signal. Sinuses/Orbits: Unremarkable orbits. Complete left sphenoid sinus opacification by mucosal thickening and complex material centrally. Minimal right maxillary sinus mucosal thickening. Trace left mastoid effusion. Other: None. IMPRESSION: 1. Small acute infarcts in the cerebellum and left parietal lobe. 2. Chronic bilateral cerebellar infarcts, increased from 2008. 3. Mild chronic small vessel ischemic disease in the cerebral white matter, increased from 2008. Electronically Signed   By: Logan Bores M.D.   On: 07/12/2017 14:03   ECHOCARDIOGRAM Study Conclusions  - Left ventricle: The estimated ejection fraction was 55%. - Aortic valve: There was mild regurgitation. - Mitral valve: There was mild regurgitation. - Right ventricle: The cavity size  was mildly dilated. - Right  atrium: The atrium was mildly dilated. - Atrial septum: Amplatzer occluder in place with no residual   color flow  Vascular Ultrasound Transcranial Doppler with Bubbles has been completed with Dr. Leonie Man. Trivial high intensity transient signals were heard at rest and with Valsalva maneuver, suggestive of clinically insignificant residual patent foramen ovale.  Subjective: Feels back to baseline, wants to leave the hospital.   Discharge Exam: Vitals:   07/13/17 1206 07/13/17 1621  BP: 112/81 126/86  Pulse: 72 66  Resp: 17 18  Temp: 98.1 F (36.7 C) 97.6 F (36.4 C)  SpO2: 98% 99%   General: Pt is alert, awake, not in acute distress Cardiovascular: RRR, S1/S2 +, no rubs, no gallops Respiratory: CTA bilaterally, no wheezing, no rhonchi Abdominal: Soft, NT, ND, bowel sounds + Extremities: No edema, no cyanosis Neuro: Alert, oriented, no cranial nerve deficits. No significant ataxia/dysdiadochokinesia on exam. No focal sensory or motor deficits in extremities. Speech normal.  Labs: Basic Metabolic Panel: Recent Labs  Lab 07/12/17 1036 07/12/17 1135 07/12/17 2158  NA 137 139  --   K 4.1 4.7  --   CL 105 104  --   CO2 24  --   --   GLUCOSE 92 91  --   BUN 16 19  --   CREATININE 1.09 1.10  --   CALCIUM 8.6*  --   --   MG  --   --  2.0  PHOS  --   --  3.4   CBC: Recent Labs  Lab 07/12/17 1036 07/12/17 1135  WBC 4.6  --   NEUTROABS 3.1  --   HGB 13.0 13.9  HCT 38.5* 41.0  MCV 89.7  --   PLT 162  --    Hgb A1c Recent Labs    07/13/17 0515  HGBA1C 5.4   Lipid Profile Recent Labs    07/13/17 0515  CHOL 108  HDL 46  LDLCALC 52  TRIG 51  CHOLHDL 2.3   Urinalysis    Component Value Date/Time   COLORURINE YELLOW 07/12/2017 1103   APPEARANCEUR CLEAR 07/12/2017 1103   LABSPEC 1.019 07/12/2017 1103   PHURINE 5.0 07/12/2017 1103   GLUCOSEU NEGATIVE 07/12/2017 1103   HGBUR NEGATIVE 07/12/2017 1103   BILIRUBINUR NEGATIVE  07/12/2017 1103   KETONESUR 5 (A) 07/12/2017 1103   PROTEINUR NEGATIVE 07/12/2017 1103   NITRITE NEGATIVE 07/12/2017 1103   LEUKOCYTESUR NEGATIVE 07/12/2017 1103    Time coordinating discharge: Approximately 40 minutes  Patrecia Pour, MD  [facility] 07/13/2017, 5:32 PM Pager [phone]

## 2017-07-13 NOTE — Care Management Note (Signed)
Case Management Note  Patient Details  Name: Robert Mclaughlin MRN: [id number] Date of Birth: January 23, 1946  Subjective/Objective:   Pt admitted with stroke                 Action/Plan:  PTA independent from home with wife.  No Needs identified prior to the note being written - discharge orders written   Expected Discharge Date:  07/13/17               Expected Discharge Plan:  Home/Self Care(Pt is independent from home with wife)  In-House Referral:     Discharge planning Services  CM Consult  Post Acute Care Choice:    Choice offered to:     DME Arranged:    DME Agency:     HH Arranged:    [place] Agency:     Status of Service:  Completed, signed off  If discussed at [employer], dates discussed:    Additional Comments:  Maryclare Labrador, RN 07/13/2017, 4:26 PM

## 2017-07-13 NOTE — Progress Notes (Signed)
*  PRELIMINARY RESULTS* Vascular Ultrasound Transcranial Doppler with Bubbles has been completed with Dr. Leonie Man. Trivial high intensity transient signals were heard at rest and with Valsalva maneuver, suggestive of clinically insignificant residual patent foramen ovale.  07/13/2017 2:54 PM Maudry Mayhew, BS, RVT, RDCS, RDMS

## 2017-07-13 NOTE — Progress Notes (Signed)
PT Cancellation Note  Patient Details Name: Robert Mclaughlin MRN: [id number] DOB: 1945/10/22   Cancelled Treatment:    Reason Eval/Treat Not Completed: PT screened, no needs identified, will sign off; patient and wife report he is back to his baseline.  OT note also reported no identified issues.  Will sign off.   Reginia Naas 07/13/2017, 4:34 PM Magda Kiel, [place] 07/13/2017

## 2017-07-13 NOTE — Progress Notes (Addendum)
NEUROHOSPITALISTS STROKE TEAM - DAILY PROGRESS NOTE    HISTORY Robert Mclaughlin is a 72 y.o. male with a history of previous dysrhythmia,  Migraine, stroke in 2008, felt to be possibly due to PFO which was closed on 04/11/2016,   He presents with sudden onset unsteadiness while playing golf earlier today.  He states that he was sitting in his golf cart, and then he stood up and felt very unsteady.  He was brought into the emergency department where Dr. Zenia Resides had him ambulate he was unsteady but able to walk.  He denies symptoms of dysarthria, extremity weakness, headache, confusion or syncope. On admission, his symptoms are rapidly improving and therefore he is not considered an IV TPA candidate.  MRI was performed which does show small cerebellar infarct. And left pareital infarct  SUBJECTIVE Patient seen today, with wife at bedside.  He is awake alert oriented x3 no acute distress.  He denies headache, dizziness while seated.  He states he was able to walk only once from the bathroom and denies associated dizziness.  Plan of care for the day discussed extensively with patient and his wife at bedside who verbalized understanding  OBJECTIVE Most recent Vital Signs: Vitals:   07/12/17 2013 07/13/17 0043 07/13/17 0415 07/13/17 1008  BP: (!) 145/93 (!) 147/84 130/80 113/83  Pulse: 63 67 66 76  Resp: 16 20 18 18   Temp: 97.8 F (36.6 C) 98 F (36.7 C) 98.6 F (37 C) 97.6 F (36.4 C)  TempSrc: Oral Oral Oral Oral  SpO2: 100% 97% 97% 99%  Weight: 77.2 kg (170 lb 3.1 oz)     Height: 5\' 10"  (1.778 m)      CBG (last 3)  No results for input(s): GLUCAP in the last 72 hours.  Physical Exam  HEENT-  Normocephalic, no lesions, without obvious abnormality.  Normal external eye and conjunctiva.   Cardiovascular- S1-S2 audible, pulses palpable throughout   Lungs-no rhonchi or wheezing noted, no excessive working breathing.  Saturations within  normal limits Abdomen- All 4 quadrants palpated and nontender Musculoskeletal-no joint tenderness, deformity or swelling Skin-warm and dry, no hyperpigmentation, vitiligo, or suspicious lesions  Neuro:  Mental Status: Alert, oriented, thought content appropriate.  Speech fluent without evidence of aphasia.  Comprehension, recall and repetition intact able to follow 3 step commands without difficulty. Cranial Nerves: II:  Visual fields grossly normal,  III,IV, VI: ptosis not present, extra-ocular motions intact, left pupils equal, round, reactive to light, right pupil sluggish and minimally reactive chronically due to previous injury while he was in college. V,VII: smile symmetric, facial light touch sensation normal bilaterally VIII: hearing intact to voice IX,X: uvula rises symmetrically XI: bilateral shoulder shrug XII: midline tongue extension Motor: Right : Upper extremity   5/5    Left:     Upper extremity   5/5  Lower extremity   5/5     Lower extremity   5/5 Tone and bulk:normal tone throughout; no atrophy noted Sensory: Pinprick and light touch intact throughout, bilaterally Deep Tendon Reflexes: 2+ and symmetric throughout in patellar, ankle Plantars: Right: downgoing   Left: downgoing Cerebellar: normal finger-to-nose, normal rapid alternating movements and normal heel-to-shin test Gait: normal  gait and station  IV Fluid Intake:   . sodium chloride      MEDICATIONS  .  stroke: mapping our early stages of recovery book   Does not apply Once  . atorvastatin  40 mg Oral q1800  . multivitamin with minerals  1 tablet Oral Daily  . Stroke Study - Clopidogrel 75 mg  75 mg Oral Q24H  . STUDY - Stroke Study - aspirin 100mg   100 mg Oral QAC breakfast  . STUDY - Stroke Study - [id number] or Placebo  4 capsule Oral BID   PRN:  acetaminophen **OR** acetaminophen (TYLENOL) oral liquid 160 mg/5 mL **OR** acetaminophen, senna-docusate  Diet:   Diet Order           Diet Heart Room  service appropriate? Yes; Fluid consistency: Thin  Diet effective now          CLINICALLY SIGNIFICANT STUDIES Basic Metabolic Panel:  Recent Labs  Lab 07/12/17 1036 07/12/17 1135 07/12/17 2158  NA 137 139  --   K 4.1 4.7  --   CL 105 104  --   CO2 24  --   --   GLUCOSE 92 91  --   BUN 16 19  --   CREATININE 1.09 1.10  --   CALCIUM 8.6*  --   --   MG  --   --  2.0  PHOS  --   --  3.4   Liver Function Tests: No results for input(s): AST, ALT, ALKPHOS, BILITOT, PROT, ALBUMIN in the last 168 hours. CBC:  Recent Labs  Lab 07/12/17 1036 07/12/17 1135  WBC 4.6  --   NEUTROABS 3.1  --   HGB 13.0 13.9  HCT 38.5* 41.0  MCV 89.7  --   PLT 162  --    Coagulation:  Recent Labs  Lab 07/12/17 1108  LABPROT 14.3  INR 1.12    Recent Labs  Lab 07/12/17 1103  COLORURINE YELLOW  LABSPEC 1.019  PHURINE 5.0  GLUCOSEU NEGATIVE  HGBUR NEGATIVE  BILIRUBINUR NEGATIVE  KETONESUR 5*  PROTEINUR NEGATIVE  NITRITE NEGATIVE  LEUKOCYTESUR NEGATIVE   Lipid Panel    Component Value Date/Time   CHOL 108 07/13/2017 0515   TRIG 51 07/13/2017 0515   HDL 46 07/13/2017 0515   CHOLHDL 2.3 07/13/2017 0515   VLDL 10 07/13/2017 0515   LDLCALC 52 07/13/2017 0515   HgbA1C  Lab Results  Component Value Date   HGBA1C 5.4 07/13/2017    Urine Drug Screen:      Component Value Date/Time   LABOPIA NONE DETECTED 07/12/2017 1103   COCAINSCRNUR NONE DETECTED 07/12/2017 1103   LABBENZ NONE DETECTED 07/12/2017 1103   AMPHETMU NONE DETECTED 07/12/2017 1103   THCU NONE DETECTED 07/12/2017 1103   LABBARB NONE DETECTED 07/12/2017 1103    Alcohol Level:  Recent Labs  Lab 07/12/17 1100  ETH <10    Ct Angio Head/Neck W Or Wo Contrast  07/12/2017 IMPRESSION: 1. Patent carotid and vertebral arteries. No dissection, aneurysm, or hemodynamically significant stenosis utilizing NASCET criteria. 2. Patent anterior and posterior intracranial circulation. No large vessel occlusion, aneurysm, or  significant stenosis.   CT head NON Con  07/12/17 IMPRESSION 1. Small acute infarct in left paramedian superior cerebellar hemisphere again seen. Punctate left parietal infarct not visualized on CT.  2. Punctate density in right inferior cerebellum without signal abnormality on MR, possibly mineralization or petechial hemorrhage.  3. Otherwise no acute infarct, hemorrhage, or mass effect identified.  Mr Brain Wo Contrast: 07/12/2017 IMPRESSION:  1. Small acute infarcts in the cerebellum and left parietal lobe.  2. Chronic bilateral cerebellar infarcts, increased from 2008.  3. Mild chronic small vessel ischemic disease in the cerebral white matter, increased from 2008. 4. Redemonstration of small acute infarct of the paramedian superior left cerebellar hemisphere 2. Multiple small, old bilateral cerebellar infarcts.   EKG  normal EKG, normal sinus rhythm. For complete results please see formal report.   Outstanding Stroke Work-up Studies:     Echocardiogram with bubble:                                PENDING TEE  Scheduled for Monday /Loop Recorder:                                             PENDING    Therapy Recommendations pending VTE Prophylaxis: SCD  ASSESSMENT 72 year old male with small cerebellar infarct, potentially embolic, artery to artery or cardioembolic.  He will need further evaluation with vascular imaging and cardiac work-up.  1.  Acute Cerebellum and left parietal lobe strokes in patient with history of increasing chronic bilateral cerebral infarcts since 2008 per CT findings.  Infarcts likely cardioembolic or atheroembolic in nature in patient with a history of previous PFO which was closed in April 11, 2016  .  At this time patient symptoms have significantly reduced.  Patient currently enrolled in the stroke study with factor XI oral inhibitor or placebo, Plavix 75 mg daily and aspirin 100 mg daily per the regimen x3 months.  Patient must not take any aspirin or  Plavix from the hospital dispensary, but must only take medications given to him through the study).  For further stroke work-up and we will order bubble study to evaluate the adequacy of the PFO closure, TEE and based on findings, proceed with loop recorder for further investigation of contributory ectopic beats.  We will continue early rehabilitation with physical therapy outpatient therapy and speech therapy.  Hemoglobin A1c 5.4 and lipids within normal limits cholesterol 108 and triglycerides 51. 2. Hx of Cerebellar stroke in 2008 without deficits 3. Hx of PFO s/p Closure in 04/11/2106  Hospital day # 1  PLAN/RECOMMENDATIONS  Allow for permissive hypertension for the first 24-48h - only treat PRN if SBP >220 mmHg. Blood pressures can be gradually normalized to SBP<140 upon discharge.  Telemetry monitoring  Echocardiogram with bubble study  TEE scheduled for Monday, followed by Loop recorder- pending schedule  Frequent neuro checks  Continue study antiplatelets.  Patient currently on factor XI oral inhibitor or placebo, Plavix 75 mg and aspirin 100 mg daily for the next 3 months  Atorvastatin 40 mg PO daily  Risk factor modification  Early Rehab with PT consult, OT consult, Speech consult     SIGNED Letha Cape DNP Neuro-hospitalist Team [phone] 07/13/2017, 11:44 AM   07/13/2017 ATTENDING ASSESSMENT    I have personally examined this patient, reviewed notes, independently viewed imaging studies, participated in medical decision making and plan of care.ROS completed by me personally and pertinent positives fully documented  I have made any additions or clarifications directly to the above note. Agree with note above.  He presented with sudden onset of gait ataxia to the right but denied leaning forwards or backwards which one would expect  from a midline cerebellar infarct while playing golf. He denied vertigo, nausea or headaches. His symptoms mostly resolved upon  admission. MRI shows embolic left parietal infarct as well as midline cerebellar infarct. He has previously had a PFO closure as well as an embolic left PCA branch infarct. He was randomized in the AXIOMATIC stroke prevention trial of new factor XI inhibitor. Long discussion with the patient and his wife and answered questions about his stroke, evaluation and treatment plan and about this program. Discussed with Dr. Bonner Puna. Greater than 50% time during this 35 minute visit was spent on counseling and coordination of care about his stroke and answered questions Antony Contras, MD Medical Director [place] Pager: [phone] 07/13/2017 1:10 PM    To contact Stroke Continuity provider, please refer to [email or web address] After hours, contact General Neurology

## 2017-07-13 NOTE — Evaluation (Signed)
Occupational Therapy Evaluation Patient Details Name: Robert Mclaughlin MRN: [id number] DOB: 1945-08-25 Today's Date: 07/13/2017    History of Present Illness Robert Mclaughlin is a 72 y.o. male with a history of previous dysrhythmia,  Migraine, stroke in 2008, felt to be possibly due to PFO which was closed on 04/11/2016,   He presents with sudden onset unsteadiness while playing golf earlier today.  He states that he was sitting in his golf cart, and then he stood up and felt very unsteady.  He was brought into the emergency department where Dr. Zenia Resides had him ambulate he was unsteady but able to walk.  He denies symptoms of dysarthria, extremity weakness, headache, confusion or syncope. On admission, his symptoms are rapidly improving and therefore he is not considered an IV TPA candidate.  MRI was performed which does show small cerebellar infarct.   Clinical Impression   OT eval complete. No deficits noted.  Pt back to baseline    Follow Up Recommendations  No OT follow up    Equipment Recommendations  None recommended by OT    Recommendations for Other Services       Precautions / Restrictions Precautions Precautions: None      Mobility Bed Mobility Overal bed mobility: Independent                Transfers Overall transfer level: Independent                    Balance Overall balance assessment: No apparent balance deficits (not formally assessed)                                         ADL either performed or assessed with clinical judgement   ADL Overall ADL's : Modified independent;At baseline                                             Vision Patient Visual Report: No change from baseline              Pertinent Vitals/Pain Pain Assessment: No/denies pain     Hand Dominance     Extremity/Trunk Assessment Upper Extremity Assessment Upper Extremity Assessment: Overall WFL for tasks assessed            Communication     Cognition Arousal/Alertness: Awake/alert Behavior During Therapy: WFL for tasks assessed/performed Overall Cognitive Status: Within Functional Limits for tasks assessed                                                Home Living Family/patient expects to be discharged to:: Private residence Living Arrangements: Spouse/significant other Available Help at Discharge: Family;Available 24 hours/day Type of Home: House Home Access: Stairs to enter [employer] of Steps: 2 Entrance Stairs-Rails: None Home Layout: Two level     Bathroom Shower/Tub: Occupational psychologist: Standard     Home Equipment: Cane - single point;Hand held shower head          Prior Functioning/Environment Level of Independence: Independent        Comments: ADLs, IADLs, driving, and enjoys golf  OT Goals(Current goals can be found in the care plan section) Acute Rehab OT Goals Patient Stated Goal: get home OT Goal Formulation: With patient  OT Frequency:      AM-PAC PT "6 Clicks" Daily Activity     Outcome Measure Help from another person eating meals?: None Help from another person taking care of personal grooming?: None Help from another person toileting, which includes using toliet, bedpan, or urinal?: None Help from another person bathing (including washing, rinsing, drying)?: None Help from another person to put on and taking off regular upper body clothing?: None   6 Click Score: 20   End of Session    Activity Tolerance: Patient tolerated treatment well Patient left: in chair;with call bell/phone within reach                   Time: 1237-1252 OT Time Calculation (min): 15 min Charges:  OT General Charges $OT Visit: 1 Visit OT Evaluation $OT Eval Moderate Complexity: 1 Mod G-Codes:     Kari Baars, OT [phone]  Payton Mccallum D 07/13/2017, 1:07 PM

## 2017-07-16 ENCOUNTER — Ambulatory Visit (HOSPITAL_COMMUNITY): Admit: 2017-07-16 | Payer: Medicare Other | Admitting: Internal Medicine

## 2017-07-16 ENCOUNTER — Ambulatory Visit (HOSPITAL_COMMUNITY)
Admission: RE | Admit: 2017-07-16 | Discharge: 2017-07-16 | Disposition: A | Discharge status: Home / Self Care | Payer: Medicare Other | Source: Ambulatory Visit | Attending: Cardiology | Admitting: Cardiology

## 2017-07-16 ENCOUNTER — Ambulatory Visit (HOSPITAL_BASED_OUTPATIENT_CLINIC_OR_DEPARTMENT_OTHER)
Admission: RE | Admit: 2017-07-16 | Discharge: 2017-07-16 | Disposition: A | Discharge status: Home / Self Care | Payer: Medicare Other | Source: Ambulatory Visit | Attending: Cardiology | Admitting: Cardiology

## 2017-07-16 ENCOUNTER — Encounter (HOSPITAL_COMMUNITY)
Admission: RE | Disposition: A | Discharge status: Home / Self Care | Payer: Self-pay | Source: Ambulatory Visit | Attending: Cardiology

## 2017-07-16 ENCOUNTER — Encounter (HOSPITAL_COMMUNITY): Payer: Self-pay | Admitting: *Deleted

## 2017-07-16 DIAGNOSIS — Z87891 Personal history of nicotine dependence: Secondary | ICD-10-CM | POA: Insufficient documentation

## 2017-07-16 DIAGNOSIS — I6389 Other cerebral infarction: Secondary | ICD-10-CM | POA: Diagnosis not present

## 2017-07-16 DIAGNOSIS — Z888 Allergy status to other drugs, medicaments and biological substances status: Secondary | ICD-10-CM | POA: Insufficient documentation

## 2017-07-16 DIAGNOSIS — Z8774 Personal history of (corrected) congenital malformations of heart and circulatory system: Secondary | ICD-10-CM | POA: Diagnosis not present

## 2017-07-16 DIAGNOSIS — I493 Ventricular premature depolarization: Secondary | ICD-10-CM | POA: Insufficient documentation

## 2017-07-16 DIAGNOSIS — M1712 Unilateral primary osteoarthritis, left knee: Secondary | ICD-10-CM | POA: Diagnosis not present

## 2017-07-16 DIAGNOSIS — Z96652 Presence of left artificial knee joint: Secondary | ICD-10-CM | POA: Insufficient documentation

## 2017-07-16 DIAGNOSIS — I639 Cerebral infarction, unspecified: Secondary | ICD-10-CM | POA: Diagnosis present

## 2017-07-16 DIAGNOSIS — Z79899 Other long term (current) drug therapy: Secondary | ICD-10-CM | POA: Diagnosis not present

## 2017-07-16 DIAGNOSIS — Z8249 Family history of ischemic heart disease and other diseases of the circulatory system: Secondary | ICD-10-CM | POA: Diagnosis not present

## 2017-07-16 DIAGNOSIS — I340 Nonrheumatic mitral (valve) insufficiency: Secondary | ICD-10-CM

## 2017-07-16 DIAGNOSIS — I6529 Occlusion and stenosis of unspecified carotid artery: Secondary | ICD-10-CM | POA: Diagnosis not present

## 2017-07-16 DIAGNOSIS — Z8673 Personal history of transient ischemic attack (TIA), and cerebral infarction without residual deficits: Secondary | ICD-10-CM | POA: Insufficient documentation

## 2017-07-16 HISTORY — PX: LOOP RECORDER INSERTION: EP1214

## 2017-07-16 HISTORY — PX: TEE WITHOUT CARDIOVERSION: SHX5443

## 2017-07-16 SURGERY — ECHOCARDIOGRAM, TRANSESOPHAGEAL
Anesthesia: Moderate Sedation

## 2017-07-16 SURGERY — LOOP RECORDER INSERTION

## 2017-07-16 MED ORDER — MIDAZOLAM HCL 5 MG/ML IJ SOLN
INTRAMUSCULAR | Status: AC
  Filled 2017-07-16: qty 2

## 2017-07-16 MED ORDER — FENTANYL CITRATE (PF) 100 MCG/2ML IJ SOLN
INTRAMUSCULAR | Status: AC
  Filled 2017-07-16: qty 2

## 2017-07-16 MED ORDER — FENTANYL CITRATE (PF) 100 MCG/2ML IJ SOLN
INTRAMUSCULAR | Status: DC | PRN
  Administered 2017-07-16: 25 ug via INTRAVENOUS

## 2017-07-16 MED ORDER — MIDAZOLAM HCL 10 MG/2ML IJ SOLN
INTRAMUSCULAR | Status: DC | PRN
  Administered 2017-07-16: 2 mg via INTRAVENOUS
  Administered 2017-07-16: 1 mg via INTRAVENOUS

## 2017-07-16 MED ORDER — SODIUM CHLORIDE 0.9 % IV SOLN: INTRAVENOUS | Status: DC

## 2017-07-16 MED ORDER — LIDOCAINE-EPINEPHRINE 1 %-1:100000 IJ SOLN
INTRAMUSCULAR | Status: DC | PRN
  Administered 2017-07-16: 20 mL

## 2017-07-16 MED ORDER — LIDOCAINE-EPINEPHRINE 1 %-1:100000 IJ SOLN
INTRAMUSCULAR | Status: AC
  Filled 2017-07-16: qty 1

## 2017-07-16 SURGICAL SUPPLY — 2 items
HRT MONITOR CONFIRM RX (Prosthesis & Implant Heart) ×2 IMPLANT
PACK LOOP INSERTION (CUSTOM PROCEDURE TRAY) ×3 IMPLANT

## 2017-07-16 NOTE — Consult Note (Signed)
            [facility] CM Primary Care Navigator  07/16/2017  Robert Mclaughlin 04/29/1945 [id number]   Attempt to seepatient at the bedside to identify possible discharge needsbuthe was already dischargedhome.  Patient was seen and treated for cerebellar stroke.  Primary care provider's officeis listed as providingtransition of care (TOC)follow-up.   Patient has discharge instruction to follow-up with primary care provider in 1- 2 weeks and neurology follow-up in 3 weeks.   For additional questions please contact:  Edwena Felty A. Marion Seese, BSN, RN-BC [facility] PRIMARY CARE Navigator Cell: [phone]

## 2017-07-16 NOTE — Interval H&P Note (Signed)
History and Physical Interval Note:  07/16/2017 1:03 PM  Robert Mclaughlin  has presented today for surgery, with the diagnosis of STROKE  The various methods of treatment have been discussed with the patient and family. After consideration of risks, benefits and other options for treatment, the patient has consented to  Procedure(s): TRANSESOPHAGEAL ECHOCARDIOGRAM (TEE) (N/A) as a surgical intervention .  The patient's history has been reviewed, patient examined, no change in status, stable for surgery.  I have reviewed the patient's chart and labs.  Questions were answered to the patient's satisfaction.     Kirk Ruths

## 2017-07-16 NOTE — Discharge Instructions (Signed)
TEE  YOU HAD AN CARDIAC PROCEDURE TODAY: Refer to the procedure report and other information in the discharge instructions given to you for any specific questions about what was found during the examination. If this information does not answer your questions, please call [facility] office at [phone] to clarify.   DIET: Your first meal following the procedure should be a light meal and then it is ok to progress to your normal diet. A half-sandwich or bowl of soup is an example of a good first meal. Heavy or fried foods are harder to digest and may make you feel nauseous or bloated. Drink plenty of fluids but you should avoid alcoholic beverages for 24 hours. If you had a esophageal dilation, please see attached instructions for diet.   ACTIVITY: Your care partner should take you home directly after the procedure. You should plan to take it easy, moving slowly for the rest of the day. You can resume normal activity the day after the procedure however YOU SHOULD NOT DRIVE, use power tools, machinery or perform tasks that involve climbing or major physical exertion for 24 hours (because of the sedation medicines used during the test).   SYMPTOMS TO REPORT IMMEDIATELY: A cardiologist can be reached at any hour. Please call [phone] for any of the following symptoms:  Vomiting of blood or coffee ground material  New, significant abdominal pain  New, significant chest pain or pain under the shoulder blades  Painful or persistently difficult swallowing  New shortness of breath  Black, tarry-looking or red, bloody stools  FOLLOW UP:  Please also call with any specific questions about appointments or follow up tests.  Post implant care instructions Keep incision clean and dry for 3 days.  You can remove outer dressing tomorrow. Leave steri-strips (little pieces of tape) on until seen in the office for wound check appointment. Call the office [phone]) for redness, drainage, swelling, or  fever.

## 2017-07-16 NOTE — Consult Note (Addendum)
ELECTROPHYSIOLOGY CONSULT NOTE  Patient ID: Robert Mclaughlin MRN: [id number], DOB/AGE: Feb 04, 1946   Admit date: 07/16/2017 Date of Consult: 07/16/2017  Primary Physician: Robert Cruel, MD Primary Cardiologist: Dr. Burt Mclaughlin Reason for Consultation: Cryptogenic stroke ; recommendations regarding Implantable Loop Recorder  History of Present Illness Robert Mclaughlin is referred today by Dr. Stanford Mclaughlin and Robert Mclaughlin to consider insertion of an ILR.  He was admitted on 07/16/2017 with 07/12/17 with CVA.  He first developed symptoms while golfing of severe "vertigo".  PMHx includes CVA in 2007, known PFO at that time, though not closed until Jan 2018, osteoarthritis, he mentions he has been told of a regular-irregular heart beat, otherwise no other known hx.  Imaging demonstrated Small acute infarcts in the cerebellum and left parietal lobe.  he has underwent workup for stroke including echocardiogram and carotid angio.  The patient was discharged to return out patient for TEE, possible loop  Echocardiogram 07/13/17 Study Conclusions - Left ventricle: The estimated ejection fraction was 55%. - Aortic valve: There was mild regurgitation. - Mitral valve: There was mild regurgitation. - Right ventricle: The cavity size was mildly dilated. - Right atrium: The atrium was mildly dilated. - Atrial septum: Amplatzer occluder in place with no residual   color flow   Lab work is reviewed.  Prior to admission, the patient denies chest pain, shortness of breath, dizziness, palpitations, or syncope.  He reports completely recovered from his stroke symptoms.    Past Medical History:  Diagnosis Date  . Arthritis    "left knee" (04/11/2016)  . Carotid artery occlusion   . Dysrhythmia    iiregular heart beat yrs ago  . Migraine equivalent    "none in years" (04/11/2016)  . PFO (patent foramen ovale)     dx 2008; closed 03/2016 by Dr Robert Mclaughlin  . Stroke [facility]) 2008   denies residual on 04/11/2016      Surgical History:  Past Surgical History:  Procedure Laterality Date  . MASS EXCISION Right 2017   "precancerous growth on my leg"  . MOUTH SURGERY     tooth extraction and root extraction   . PATENT FORAMEN OVALE CLOSURE  04/11/2016  . PATENT FORAMEN OVALE CLOSURE N/A 04/11/2016   Procedure: Patent [place](PFO) Closure;  Surgeon: Robert Mocha, MD;  Location: [facility];  Service: Cardiovascular;  Laterality: N/A;  . TONSILLECTOMY    . TOOTH EXTRACTION    . TOTAL KNEE ARTHROPLASTY Left 08/22/2016   Procedure: LEFT TOTAL KNEE ARTHROPLASTY;  Surgeon: Robert Pel, MD;  Location: [facility];  Service: Orthopedics;  Laterality: Left;     Medications Prior to Admission  Medication Sig Dispense Refill Last Dose  . ARTIFICIAL TEAR OP Place 1 drop into both eyes daily.    07/12/2017 at Unknown time  . b complex vitamins tablet Take 1 tablet by mouth at bedtime.   07/11/2017 at Unknown time  . carbamide peroxide (DEBROX) 6.5 % OTIC solution Place 5 drops into the left ear as needed.   07/12/2017 at Unknown time  . diclofenac sodium (VOLTAREN) 1 % GEL APPLY 1 APPLICATION TOPICALLY 4 (FOUR) TIMES DAILY AS NEEDED (MUSCLE PAIN). 100 g 3 Past Week at prn  . ibuprofen (ADVIL,MOTRIN) 400 MG tablet Take 400 mg by mouth as needed.   unknown at prn  . Multiple Vitamin (MULTIVITAMIN) capsule Take 1 capsule by mouth daily.   07/12/2017 at Unknown time  . simvastatin (ZOCOR) 20 MG tablet Take 20 mg by mouth  at bedtime.    07/11/2017 at Unknown time    Inpatient Medications:   Allergies:  Allergies  Allergen Reactions  . Latanoprost Other (See Comments)    Ineffective   . Lumigan [Bimatoprost] Other (See Comments)    Ineffective    Social History   Socioeconomic History  . Marital status: Married    Spouse name: Not on file  . Number of children: Not on file  . Years of education: Not on file  . Highest education level: Not on file  Occupational History  . Not on file  Social  Needs  . Financial resource strain: Not on file  . Food insecurity:    Worry: Not on file    Inability: Not on file  . Transportation needs:    Medical: Not on file    Non-medical: Not on file  Tobacco Use  . Smoking status: Former Smoker    Packs/day: 1.00    Years: 6.00    Pack years: 6.00    Last attempt to quit: 03/13/1969    Years since quitting: 48.3  . Smokeless tobacco: Never Used  Substance and Sexual Activity  . Alcohol use: Yes    Alcohol/week: 8.4 oz    Types: 14 Cans of beer per week    Comment: 2 drinks per day  . Drug use: No  . Sexual activity: Yes  Lifestyle  . Physical activity:    Days per week: Not on file    Minutes per session: Not on file  . Stress: Not on file  Relationships  . Social connections:    Talks on phone: Not on file    Gets together: Not on file    Attends religious service: Not on file    Active member of club or organization: Not on file    Attends meetings of clubs or organizations: Not on file    Relationship status: Not on file  . Intimate partner violence:    Fear of current or ex partner: Not on file    Emotionally abused: Not on file    Physically abused: Not on file    Forced sexual activity: Not on file  Other Topics Concern  . Not on file  Social History Narrative  . Not on file     Family History  Problem Relation Age of Onset  . Heart disease Mother        Heart Disease before age 72  . Varicose Veins Mother   . Hyperlipidemia Father       Review of Systems: All other systems reviewed and are otherwise negative except as noted above.  Physical Exam: Vitals:   07/16/17 1200  BP: 129/79  Pulse: 65  Resp: 20  Temp: 97.7 F (36.5 C)  TempSrc: Oral  SpO2: 100%    GEN- The patient is well appearing, alert and oriented x 3 today.   Head- normocephalic, atraumatic Eyes-  Sclera clear, conjunctiva pink Ears- hearing intact Oropharynx- clear Neck- supple Lungs- CTA b/l, normal work of breathing Heart-  RRR, no murmurs, rubs or gallops  GI- soft, NT, ND Extremities- no clubbing, cyanosis, or edema MS- no significant deformity or atrophy Skin- no rash or lesion Psych- euthymic mood, full affect   Labs:   Lab Results  Component Value Date   WBC 4.6 07/12/2017   HGB 13.9 07/12/2017   HCT 41.0 07/12/2017   MCV 89.7 07/12/2017   PLT 162 07/12/2017    Recent Labs  Lab 07/12/17 1036 07/12/17  1135  NA 137 139  K 4.1 4.7  CL 105 104  CO2 24  --   BUN 16 19  CREATININE 1.09 1.10  CALCIUM 8.6*  --   GLUCOSE 92 91   No results found for: CKTOTAL, CKMB, CKMBINDEX, TROPONINI Lab Results  Component Value Date   CHOL 108 07/13/2017   Lab Results  Component Value Date   HDL 46 07/13/2017   Lab Results  Component Value Date   LDLCALC 52 07/13/2017   Lab Results  Component Value Date   TRIG 51 07/13/2017   Lab Results  Component Value Date   CHOLHDL 2.3 07/13/2017   No results found for: LDLDIRECT  No results found for: DDIMER   Radiology/Studies:   Ct Angio Head W Or Wo Contrast Result Date: 07/12/2017 CLINICAL DATA:  72 y/o  M; stroke for follow-up. EXAM: CT ANGIOGRAPHY HEAD AND NECK TECHNIQUE: Multidetector CT imaging of the head and neck was performed using the standard protocol during bolus administration of intravenous contrast. Multiplanar CT image reconstructions and MIPs were obtained to evaluate the vascular anatomy. Carotid stenosis measurements (when applicable) are obtained utilizing NASCET criteria, using the distal internal carotid diameter as the denominator. CONTRAST:  50mL ISOVUE-370 IOPAMIDOL (ISOVUE-370) INJECTION 76% COMPARISON:  07/12/2017 MRI head.  04/18/2006 MRA of head and neck. FINDINGS: CT HEAD FINDINGS Brain: Small acute infarct within left paramedian superior cerebellum again seen. Infarct in left parietal lobe poorly visualized on CT. Small chronic infarct in left posterior inferior temporal lobe. Small chronic lacunar infarcts in left cerebellum.  Punctate density in right inferior cerebellum (series 7, image 18). Otherwise no new acute stroke, hemorrhage, or focal mass effect. Vascular: As below. Skull: Normal. Negative for fracture or focal lesion. Sinuses: Opacification of the sphenoid sinus.  Otherwise negative. Orbits: No acute finding. Review of the MIP images confirms the above findings CTA NECK FINDINGS Aortic arch: Standard branching. Imaged portion shows no evidence of aneurysm or dissection. No significant stenosis of the major arch vessel origins. Right carotid system: No evidence of dissection, stenosis (50% or greater) or occlusion. Left carotid system: No evidence of dissection, stenosis (50% or greater) or occlusion. Mild calcific atherosclerosis of left carotid bifurcation with minimal less than 30% proximal ICA stenosis. Vertebral arteries: Left dominant. No evidence of dissection, stenosis (50% or greater) or occlusion. Skeleton: Mild cervical spondylosis. No high-grade bony canal stenosis. Other neck: Negative. Upper chest: Negative. Review of the MIP images confirms the above findings CTA HEAD FINDINGS Anterior circulation: No significant stenosis, proximal occlusion, aneurysm, or vascular malformation. Mild calcific atherosclerosis of the carotid siphons without stenosis. Posterior circulation: No significant stenosis, proximal occlusion, aneurysm, or vascular malformation. Venous sinuses: As permitted by contrast timing, patent. Anatomic variants: Complete circle-of-Willis. Delayed phase: No abnormal intracranial enhancement. Review of the MIP images confirms the above findings IMPRESSION: CT head: 1. Small acute infarct in left paramedian superior cerebellar hemisphere again seen. Punctate left parietal infarct not visualized on CT. 2. Punctate density in right inferior cerebellum without signal abnormality on MR, possibly mineralization or petechial hemorrhage. 3. Otherwise no acute infarct, hemorrhage, or mass effect identified. CTA  neck: 1. Patent carotid and vertebral arteries. No dissection, aneurysm, or hemodynamically significant stenosis utilizing NASCET criteria. CTA head: 1. Patent anterior and posterior intracranial circulation. No large vessel occlusion, aneurysm, or significant stenosis. These results will be called to the ordering clinician or representative by the Radiologist Assistant, and communication documented in the PACS or zVision Dashboard. Electronically Signed  By: Kristine Garbe M.D.   On: 07/12/2017 19:28   Dg Chest 2 View Result Date: 07/12/2017 CLINICAL DATA:  Dizziness, difficult gait, stroke today, former smoking history EXAM: CHEST - 2 VIEW COMPARISON:  Chest x-ray of 08/11/2016 FINDINGS: No active infiltrate or effusion. Mediastinal and hilar contours are unremarkable. The heart is within normal limits in size. Closure device for closure of patent foramen ovale is noted. There are mild degenerative changes in the thoracic spine. IMPRESSION: No active cardiopulmonary disease. Closure device for previous patent foramen ovale is noted. Electronically Signed   By: Ivar Drape M.D.   On: 07/12/2017 16:48    Mr Brain Wo Contrast Result Date: 07/12/2017 CLINICAL DATA:  Stroke follow-up.  BMS trial protocol baseline. EXAM: MRI HEAD WITHOUT CONTRAST TECHNIQUE: Multiplanar, multiecho pulse sequences of the brain and surrounding structures were obtained without intravenous contrast. COMPARISON:  Brain MRI 07/12/2017 at 12:58 p.m. FINDINGS: BRAIN: The midline structures are normal. There is no acute infarct or acute hemorrhage. No mass lesion, hydrocephalus, dural abnormality or extra-axial collection. Multifocal white matter hyperintensity, most commonly due to chronic ischemic microangiopathy. There is bilateral basal ganglia mineralization. No age-advanced or lobar predominant atrophy. No chronic microhemorrhage or superficial siderosis. VASCULAR: Major intracranial arterial and venous sinus flow voids are  preserved. SKULL AND UPPER CERVICAL SPINE: The visualized skull base, calvarium, upper cervical spine and extracranial soft tissues are normal. SINUSES/ORBITS: No fluid levels or advanced mucosal thickening. No mastoid or middle ear effusion. Normal orbits. IMPRESSION: 1. Redemonstration of small acute infarct of the paramedian superior left cerebellar hemisphere 2. Multiple small, old bilateral cerebellar infarcts. Electronically Signed   By: Ulyses Jarred M.D.   On: 07/12/2017 23:00   Mr Brain Wo Contrast (neuro Protocol) Result Date: 07/12/2017 CLINICAL DATA:  Ataxia.  Sudden onset dizziness while playing golf. EXAM: MRI HEAD WITHOUT CONTRAST TECHNIQUE: Multiplanar, multiecho pulse sequences of the brain and surrounding structures were obtained without intravenous contrast. COMPARISON:  04/18/2006 FINDINGS: Brain: A 13 x 5 mm acute infarct is present in the superior cerebellum just left of midline. There is also a punctate 1-2 mm focus of trace diffusion signal abnormality involving left parietal cortex (series 5001, image 65) with assessment for reduced ADC limited by its small size though also most likely reflecting an acute infarct. No intracranial hemorrhage, mass, midline shift, or extra-axial fluid collection is identified. Small chronic infarcts are present in the cerebellum bilaterally and have increased in number from 2008. A chronic posterior left temporal lobe infarct was acute on the prior study. T2 hyperintensities in the periventricular white matter have progressed and are nonspecific but compatible with mild chronic small vessel ischemic disease. Mild cerebral atrophy is within normal limits for age. Vascular: Major intracranial vascular flow voids are preserved. Skull and upper cervical spine: Unremarkable bone marrow signal. Sinuses/Orbits: Unremarkable orbits. Complete left sphenoid sinus opacification by mucosal thickening and complex material centrally. Minimal right maxillary sinus mucosal  thickening. Trace left mastoid effusion. Other: None. IMPRESSION: 1. Small acute infarcts in the cerebellum and left parietal lobe. 2. Chronic bilateral cerebellar infarcts, increased from 2008. 3. Mild chronic small vessel ischemic disease in the cerebral white matter, increased from 2008. Electronically Signed   By: Logan Bores M.D.   On: 07/12/2017 14:03    12-lead ECG SR only All prior EKG's in EPIC reviewed with no documented atrial fibrillation  CV strips from his hospital stay were reviewed, SR only  Assessment and Plan:  1. Cryptogenic stroke The patient  presents with cryptogenic stroke.  The patient has a TEE planned for today.  I spoke at length with the patient about monitoring for afib with either a 30 day event monitor or an implantable loop recorder.  Risks, benefits, and alteratives to implantable loop recorder were discussed with the patient today.   At this time, the patient is very clear in his decision to proceed with implantable loop recorder.   Wound care was reviewed with the patient (keep incision clean and dry for 3 days).  Wound check will be scheduled for the patient  Please call with questions.   Renee Dyane Dustman, PA-C 07/16/2017\  EP Attending  Patient seen and examined. His extensive history has been reviewed and exam is as noted above by Tommye Standard, PA-C. The patient has had recurrent strokes, had his PFO closed and continued to have strokes. I have reviewed the indications for insertion of an ILR and he wishes to proceed.  Mikle Bosworth.D.

## 2017-07-16 NOTE — H&P (Signed)
Robert Pour, MD  Physician  Family Medicine  Discharge Summary  Signed  Date of Service:  07/13/2017 2:00 PM       Related encounter: ED to Hosp-Admission (Discharged) from 07/12/2017 in [facility]      Signed      Expand All Collapse All      Show:Clear all [x] Manual[x] Template[x] Copied  Added by: [x] Robert Pour, MD   [] Hover for details   Physician Discharge Summary  MARCQUIS RIDLON MWU:[id number] DOB: 12/09/1945 DOA: 07/12/2017  PCP: Lawerance Cruel, MD  Admit date: 07/12/2017 Discharge date: 07/13/2017  Admitted From: Home Disposition: Home   Recommendations for Outpatient Follow-up:  1. Follow up with PCP in 1-2 weeks 2. Neurology will supply medications and follow up per [facility] prevention trial protocol.  Home Health: None Equipment/Devices: None Discharge Condition: Stable CODE STATUS: Full Diet recommendation: Heart healthy  Brief/Interim Summary: Robert Mclaughlin is a 72 y.o. male with a history of embolic CVA w/PFO in 7253 s/p closure in 2018 prior to left TKA who presented to the ED with unsteadiness while playing golf. He denied falling or focal neurological deficits. Symptoms were improving in ED, so not considered TPA candidate. MRI brain confirmed small paramedian cerebellar and left parietal infarcts. He was admitted for stroke work up and enrolled in clinical trial involving standard of care + oral factor XI inhibitor (or placebo). Due to concern for ongoing embolic CVAs, bubble study was performed to evaluate PFO closure. TEE and implanted loop recorder placement are also scheduled, though these will be performed as an outpatient.   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 HbA1c 5.4%.  - Echocardiogram without CES, Amplatzer occluder in place with no residual color flow, bubble study with trivial high intensity transient signals heard at rest and with valsalva maneuver, suggestive of clinically insignificant residual patent foramen ovale.  - Plan TEE, ILR next week as outpatient. Hx PVCs but no captured AFib.  - Per PT/OT, deficits resolved, no further follow up indicated.   Orthostatic hypotension: IV hydration overnight, symptoms resolved.   History of CVA, PFO closure: Noted as above  Left knee OA s/p TKA doing well.   Discharge Instructions     Discharge Instructions    Diet - low sodium heart healthy   Complete by:  As directed    Discharge instructions   Complete by:  As directed    You will be taking study-provided medications and will need to follow up with neurology as directed. You will be contacted to schedule this.   You will need a TEE and loop recorder placement, though this will be arranged as an outpatient.   If your symptoms return, seek medical attention right away.   Increase activity slowly   Complete by:  As directed           Allergies as of 07/13/2017      Reactions   Latanoprost Other (See Comments)   Ineffective    Lumigan [bimatoprost] Other (See Comments)   Ineffective  Medication List    STOP taking these medications   aspirin EC 81 MG tablet     TAKE these medications   ARTIFICIAL TEAR OP Place 1 drop into both eyes daily.   b complex vitamins tablet Take 1 tablet by mouth at bedtime.   carbamide peroxide 6.5 % OTIC solution Commonly known as:  DEBROX Place 5 drops into the left ear as needed.   diclofenac sodium 1 % Gel Commonly known as:   VOLTAREN APPLY 1 APPLICATION TOPICALLY 4 (FOUR) TIMES DAILY AS NEEDED (MUSCLE PAIN).   ibuprofen 400 MG tablet Commonly known as:  ADVIL,MOTRIN Take 400 mg by mouth as needed.   multivitamin capsule Take 1 capsule by mouth daily.   simvastatin 20 MG tablet Commonly known as:  ZOCOR Take 20 mg by mouth at bedtime.         Follow-up Information    Lawerance Cruel, MD Follow up.   Specialty:  Family Medicine Contact information: [address] [place] [address] [phone]        Garvin Fila, MD. Schedule an appointment as soon as possible for a visit in 3 week(s).   Specialties:  Neurology, Radiology Why:  Call if you are not contacted in the next week. Contact information: [address] [place] [address] [phone]        [facility] Follow up on 07/16/2017.   Why:  Please arrive at 11:00 AM (2 hrs before procedure), Transesophageal ultrasound is scheduled for 1:00 PM. No not eat or drink past midnight.  Contact information: [place] [address] [phone]              Allergies  Allergen Reactions  . Latanoprost Other (See Comments)    Ineffective   . Lumigan [Bimatoprost] Other (See Comments)    Ineffective    Consultations:  Neurology, Dr. Leonie Man  Procedures/Studies: ImagingResults  Ct Angio Head W Or Wo Contrast  Result Date: 07/12/2017 CLINICAL DATA:  72 y/o  M; stroke for follow-up. EXAM: CT ANGIOGRAPHY HEAD AND NECK TECHNIQUE: Multidetector CT imaging of the head and neck was performed using the standard protocol during bolus administration of intravenous contrast. Multiplanar CT image reconstructions and MIPs were obtained to evaluate the vascular anatomy. Carotid stenosis measurements (when applicable) are obtained utilizing NASCET criteria, using the distal internal carotid diameter as the denominator. CONTRAST:  4mL ISOVUE-370  IOPAMIDOL (ISOVUE-370) INJECTION 76% COMPARISON:  07/12/2017 MRI head.  04/18/2006 MRA of head and neck. FINDINGS: CT HEAD FINDINGS Brain: Small acute infarct within left paramedian superior cerebellum again seen. Infarct in left parietal lobe poorly visualized on CT. Small chronic infarct in left posterior inferior temporal lobe. Small chronic lacunar infarcts in left cerebellum. Punctate density in right inferior cerebellum (series 7, image 18). Otherwise no new acute stroke, hemorrhage, or focal mass effect. Vascular: As below. Skull: Normal. Negative for fracture or focal lesion. Sinuses: Opacification of the sphenoid sinus.  Otherwise negative. Orbits: No acute finding. Review of the MIP images confirms the above findings CTA NECK FINDINGS Aortic arch: Standard branching. Imaged portion shows no evidence of aneurysm or dissection. No significant stenosis of the major arch vessel origins. Right carotid system: No evidence of dissection, stenosis (50% or greater) or occlusion. Left carotid system: No evidence of dissection, stenosis (50% or greater) or occlusion. Mild calcific atherosclerosis of left carotid bifurcation with minimal less than 30% proximal ICA stenosis. Vertebral arteries: Left dominant. No evidence  of dissection, stenosis (50% or greater) or occlusion. Skeleton: Mild cervical spondylosis. No high-grade bony canal stenosis. Other neck: Negative. Upper chest: Negative. Review of the MIP images confirms the above findings CTA HEAD FINDINGS Anterior circulation: No significant stenosis, proximal occlusion, aneurysm, or vascular malformation. Mild calcific atherosclerosis of the carotid siphons without stenosis. Posterior circulation: No significant stenosis, proximal occlusion, aneurysm, or vascular malformation. Venous sinuses: As permitted by contrast timing, patent. Anatomic variants: Complete circle-of-Willis. Delayed phase: No abnormal intracranial enhancement. Review of the MIP images confirms  the above findings IMPRESSION: CT head: 1. Small acute infarct in left paramedian superior cerebellar hemisphere again seen. Punctate left parietal infarct not visualized on CT. 2. Punctate density in right inferior cerebellum without signal abnormality on MR, possibly mineralization or petechial hemorrhage. 3. Otherwise no acute infarct, hemorrhage, or mass effect identified. CTA neck: 1. Patent carotid and vertebral arteries. No dissection, aneurysm, or hemodynamically significant stenosis utilizing NASCET criteria. CTA head: 1. Patent anterior and posterior intracranial circulation. No large vessel occlusion, aneurysm, or significant stenosis. These results will be called to the ordering clinician or representative by the Radiologist Assistant, and communication documented in the PACS or zVision Dashboard. Electronically Signed   By: Kristine Garbe M.D.   On: 07/12/2017 19:28   Dg Chest 2 View  Result Date: 07/12/2017 CLINICAL DATA:  Dizziness, difficult gait, stroke today, former smoking history EXAM: CHEST - 2 VIEW COMPARISON:  Chest x-ray of 08/11/2016 FINDINGS: No active infiltrate or effusion. Mediastinal and hilar contours are unremarkable. The heart is within normal limits in size. Closure device for closure of patent foramen ovale is noted. There are mild degenerative changes in the thoracic spine. IMPRESSION: No active cardiopulmonary disease. Closure device for previous patent foramen ovale is noted. Electronically Signed   By: Ivar Drape M.D.   On: 07/12/2017 16:48   Ct Angio Neck W Or Wo Contrast  Result Date: 07/12/2017 CLINICAL DATA:  72 y/o  M; stroke for follow-up. EXAM: CT ANGIOGRAPHY HEAD AND NECK TECHNIQUE: Multidetector CT imaging of the head and neck was performed using the standard protocol during bolus administration of intravenous contrast. Multiplanar CT image reconstructions and MIPs were obtained to evaluate the vascular anatomy. Carotid stenosis measurements (when  applicable) are obtained utilizing NASCET criteria, using the distal internal carotid diameter as the denominator. CONTRAST:  32mL ISOVUE-370 IOPAMIDOL (ISOVUE-370) INJECTION 76% COMPARISON:  07/12/2017 MRI head.  04/18/2006 MRA of head and neck. FINDINGS: CT HEAD FINDINGS Brain: Small acute infarct within left paramedian superior cerebellum again seen. Infarct in left parietal lobe poorly visualized on CT. Small chronic infarct in left posterior inferior temporal lobe. Small chronic lacunar infarcts in left cerebellum. Punctate density in right inferior cerebellum (series 7, image 18). Otherwise no new acute stroke, hemorrhage, or focal mass effect. Vascular: As below. Skull: Normal. Negative for fracture or focal lesion. Sinuses: Opacification of the sphenoid sinus.  Otherwise negative. Orbits: No acute finding. Review of the MIP images confirms the above findings CTA NECK FINDINGS Aortic arch: Standard branching. Imaged portion shows no evidence of aneurysm or dissection. No significant stenosis of the major arch vessel origins. Right carotid system: No evidence of dissection, stenosis (50% or greater) or occlusion. Left carotid system: No evidence of dissection, stenosis (50% or greater) or occlusion. Mild calcific atherosclerosis of left carotid bifurcation with minimal less than 30% proximal ICA stenosis. Vertebral arteries: Left dominant. No evidence of dissection, stenosis (50% or greater) or occlusion. Skeleton: Mild cervical spondylosis. No high-grade bony canal  stenosis. Other neck: Negative. Upper chest: Negative. Review of the MIP images confirms the above findings CTA HEAD FINDINGS Anterior circulation: No significant stenosis, proximal occlusion, aneurysm, or vascular malformation. Mild calcific atherosclerosis of the carotid siphons without stenosis. Posterior circulation: No significant stenosis, proximal occlusion, aneurysm, or vascular malformation. Venous sinuses: As permitted by contrast timing,  patent. Anatomic variants: Complete circle-of-Willis. Delayed phase: No abnormal intracranial enhancement. Review of the MIP images confirms the above findings IMPRESSION: CT head: 1. Small acute infarct in left paramedian superior cerebellar hemisphere again seen. Punctate left parietal infarct not visualized on CT. 2. Punctate density in right inferior cerebellum without signal abnormality on MR, possibly mineralization or petechial hemorrhage. 3. Otherwise no acute infarct, hemorrhage, or mass effect identified. CTA neck: 1. Patent carotid and vertebral arteries. No dissection, aneurysm, or hemodynamically significant stenosis utilizing NASCET criteria. CTA head: 1. Patent anterior and posterior intracranial circulation. No large vessel occlusion, aneurysm, or significant stenosis. These results will be called to the ordering clinician or representative by the Radiologist Assistant, and communication documented in the PACS or zVision Dashboard. Electronically Signed   By: Kristine Garbe M.D.   On: 07/12/2017 19:28   Mr Brain Wo Contrast  Result Date: 07/12/2017 CLINICAL DATA:  Stroke follow-up.  BMS trial protocol baseline. EXAM: MRI HEAD WITHOUT CONTRAST TECHNIQUE: Multiplanar, multiecho pulse sequences of the brain and surrounding structures were obtained without intravenous contrast. COMPARISON:  Brain MRI 07/12/2017 at 12:58 p.m. FINDINGS: BRAIN: The midline structures are normal. There is no acute infarct or acute hemorrhage. No mass lesion, hydrocephalus, dural abnormality or extra-axial collection. Multifocal white matter hyperintensity, most commonly due to chronic ischemic microangiopathy. There is bilateral basal ganglia mineralization. No age-advanced or lobar predominant atrophy. No chronic microhemorrhage or superficial siderosis. VASCULAR: Major intracranial arterial and venous sinus flow voids are preserved. SKULL AND UPPER CERVICAL SPINE: The visualized skull base, calvarium, upper  cervical spine and extracranial soft tissues are normal. SINUSES/ORBITS: No fluid levels or advanced mucosal thickening. No mastoid or middle ear effusion. Normal orbits. IMPRESSION: 1. Redemonstration of small acute infarct of the paramedian superior left cerebellar hemisphere 2. Multiple small, old bilateral cerebellar infarcts. Electronically Signed   By: Ulyses Jarred M.D.   On: 07/12/2017 23:00   Mr Brain Wo Contrast (neuro Protocol)  Result Date: 07/12/2017 CLINICAL DATA:  Ataxia.  Sudden onset dizziness while playing golf. EXAM: MRI HEAD WITHOUT CONTRAST TECHNIQUE: Multiplanar, multiecho pulse sequences of the brain and surrounding structures were obtained without intravenous contrast. COMPARISON:  04/18/2006 FINDINGS: Brain: A 13 x 5 mm acute infarct is present in the superior cerebellum just left of midline. There is also a punctate 1-2 mm focus of trace diffusion signal abnormality involving left parietal cortex (series 5001, image 65) with assessment for reduced ADC limited by its small size though also most likely reflecting an acute infarct. No intracranial hemorrhage, mass, midline shift, or extra-axial fluid collection is identified. Small chronic infarcts are present in the cerebellum bilaterally and have increased in number from 2008. A chronic posterior left temporal lobe infarct was acute on the prior study. T2 hyperintensities in the periventricular white matter have progressed and are nonspecific but compatible with mild chronic small vessel ischemic disease. Mild cerebral atrophy is within normal limits for age. Vascular: Major intracranial vascular flow voids are preserved. Skull and upper cervical spine: Unremarkable bone marrow signal. Sinuses/Orbits: Unremarkable orbits. Complete left sphenoid sinus opacification by mucosal thickening and complex material centrally. Minimal right maxillary sinus mucosal thickening. Trace  left mastoid effusion. Other: None. IMPRESSION: 1. Small acute  infarcts in the cerebellum and left parietal lobe. 2. Chronic bilateral cerebellar infarcts, increased from 2008. 3. Mild chronic small vessel ischemic disease in the cerebral white matter, increased from 2008. Electronically Signed   By: Logan Bores M.D.   On: 07/12/2017 14:03      ECHOCARDIOGRAM Study Conclusions  - Left ventricle: The estimated ejection fraction was 55%. - Aortic valve: There was mild regurgitation. - Mitral valve: There was mild regurgitation. - Right ventricle: The cavity size was mildly dilated. - Right atrium: The atrium was mildly dilated. - Atrial septum: Amplatzer occluder in place with no residual color flow  Vascular Ultrasound Transcranial Doppler with Bubbleshas been completed with Dr. Leonie Man. Trivial high intensity transient signals were heard at rest and with Valsalva maneuver, suggestive of clinically insignificant residual patent foramen ovale.  Subjective: Feels back to baseline, wants to leave the hospital.   Discharge Exam:     Vitals:   07/13/17 1206 07/13/17 1621  BP: 112/81 126/86  Pulse: 72 66  Resp: 17 18  Temp: 98.1 F (36.7 C) 97.6 F (36.4 C)  SpO2: 98% 99%   General: Pt is alert, awake, not in acute distress Cardiovascular: RRR, S1/S2 +, no rubs, no gallops Respiratory: CTA bilaterally, no wheezing, no rhonchi Abdominal: Soft, NT, ND, bowel sounds + Extremities: No edema, no cyanosis Neuro: Alert, oriented, no cranial nerve deficits. No significant ataxia/dysdiadochokinesia on exam. No focal sensory or motor deficits in extremities. Speech normal.  Labs: Basic Metabolic Panel: LastLabs       Recent Labs  Lab 07/12/17 1036 07/12/17 1135 07/12/17 2158  NA 137 139  --   K 4.1 4.7  --   CL 105 104  --   CO2 24  --   --   GLUCOSE 92 91  --   BUN 16 19  --   CREATININE 1.09 1.10  --   CALCIUM 8.6*  --   --   MG  --   --  2.0  PHOS  --   --  3.4     CBC: LastLabs      Recent Labs  Lab  07/12/17 1036 07/12/17 1135  WBC 4.6  --   NEUTROABS 3.1  --   HGB 13.0 13.9  HCT 38.5* 41.0  MCV 89.7  --   PLT 162  --      Hgb A1c RecentLabs(last2labs)  Recent Labs    07/13/17 0515  HGBA1C 5.4     Lipid Profile RecentLabs(last2labs)     Recent Labs    07/13/17 0515  CHOL 108  HDL 46  LDLCALC 52  TRIG 51  CHOLHDL 2.3     Urinalysis Labs(Brief)          Component Value Date/Time   COLORURINE YELLOW 07/12/2017 [place] 07/12/2017 1103   LABSPEC 1.019 07/12/2017 1103   PHURINE 5.0 07/12/2017 1103   GLUCOSEU NEGATIVE 07/12/2017 1103   HGBUR NEGATIVE 07/12/2017 1103   BILIRUBINUR NEGATIVE 07/12/2017 1103   KETONESUR 5 (A) 07/12/2017 1103   PROTEINUR NEGATIVE 07/12/2017 1103   NITRITE NEGATIVE 07/12/2017 1103   LEUKOCYTESUR NEGATIVE 07/12/2017 1103      Time coordinating discharge: Approximately 40 minutes  Robert Pour, MD       [facility] 07/13/2017, 5:32 PM Pager [phone]   For TEE; no changes. Kirk Ruths, MD

## 2017-07-16 NOTE — Progress Notes (Signed)
  Echocardiogram Echocardiogram Transesophageal has been performed.  Robert Mclaughlin 07/16/2017, 1:40 PM

## 2017-07-16 NOTE — Progress Notes (Signed)
    Transesophageal Echocardiogram Note  Robert Mclaughlin [id number] 1945/08/07  Procedure: Transesophageal Echocardiogram Indications: CVA  Procedure Details Consent: Obtained Time Out: Verified patient identification, verified procedure, site/side was marked, verified correct patient position, special equipment/implants available, Radiology Safety Procedures followed,  medications/allergies/relevent history reviewed, required imaging and test results available.  Performed  Medications:  During this procedure the patient is administered a total of Versed 3 mg and Fentanyl 25 mcg  to achieve and maintain moderate conscious sedation.  The patient's heart rate, blood pressure, and oxygen saturation are monitored continuously during the procedure. The period of conscious sedation is 30 minutes, of which I was present face-to-face 100% of this time.  Normal LV function; prolapse of anterior MV leaflet with moderate MR; mild AI; PFO closure device in place with no obvious residual shunt; negative saline microcavitation study.   Complications: No apparent complications Patient did tolerate procedure well.  Kirk Ruths, MD

## 2017-07-17 ENCOUNTER — Encounter (HOSPITAL_COMMUNITY): Payer: Self-pay | Admitting: Internal Medicine

## 2017-07-20 ENCOUNTER — Other Ambulatory Visit: Payer: Self-pay

## 2017-07-23 ENCOUNTER — Ambulatory Visit (INDEPENDENT_AMBULATORY_CARE_PROVIDER_SITE_OTHER): Payer: Medicare Other | Admitting: Physician Assistant

## 2017-07-23 ENCOUNTER — Ambulatory Visit (HOSPITAL_COMMUNITY)
Admission: RE | Admit: 2017-07-23 | Discharge: 2017-07-23 | Disposition: A | Discharge status: Home / Self Care | Payer: Medicare Other | Source: Ambulatory Visit | Attending: Family | Admitting: Family

## 2017-07-23 ENCOUNTER — Other Ambulatory Visit: Payer: Self-pay

## 2017-07-23 VITALS — BP 139/81 | HR 64 | Temp 97.5°F | Resp 16 | Ht 70.0 in | Wt 172.0 lb

## 2017-07-23 DIAGNOSIS — Z87891 Personal history of nicotine dependence: Secondary | ICD-10-CM | POA: Insufficient documentation

## 2017-07-23 DIAGNOSIS — I6523 Occlusion and stenosis of bilateral carotid arteries: Secondary | ICD-10-CM | POA: Diagnosis not present

## 2017-07-23 DIAGNOSIS — I639 Cerebral infarction, unspecified: Secondary | ICD-10-CM | POA: Diagnosis not present

## 2017-07-23 DIAGNOSIS — Q211 Atrial septal defect: Secondary | ICD-10-CM | POA: Insufficient documentation

## 2017-07-23 DIAGNOSIS — I6529 Occlusion and stenosis of unspecified carotid artery: Secondary | ICD-10-CM | POA: Insufficient documentation

## 2017-07-23 DIAGNOSIS — Z8673 Personal history of transient ischemic attack (TIA), and cerebral infarction without residual deficits: Secondary | ICD-10-CM

## 2017-07-23 DIAGNOSIS — Q2112 Patent foramen ovale: Secondary | ICD-10-CM

## 2017-07-23 NOTE — Progress Notes (Signed)
Established Carotid Patient   History of Present Illness   Robert Mclaughlin is a 72 y.o. (Aug 03, 1945) male who presents to go over carotid duplex study.  He is followed for carotid artery stenosis.  Patient had history of a CVA in 2008.  During office visit today patient details a CVA and hospitalization he had about 2 weeks ago.  MRI showed left cerebeller infarct.  At the time of the CVA patient said he was stumbling around and could not find his balance.  Today he states he has no persistent deficit.  He denies any other strokelike symptoms including slurring speech, changes in vision, or one-sided weakness.  He will follow-up with his neurologist this week.  He is taking aspirin and a statin daily and has temporarily been put back on Plavix.  During hospitalization CTA of his head and neck was also obtained and did not demonstrate any large vessel acute occlusion involving his extracranial carotids.  He says they were unable to explain to him the cause of his recent stroke.  An internal loop recorder however was placed during hospitalization.  Current Outpatient Medications  Medication Sig Dispense Refill  . ARTIFICIAL TEAR OP Place 1 drop into both eyes daily.     [name] Kitchen aspirin 81 MG tablet Take 81 mg by mouth daily.    [name] Kitchen b complex vitamins tablet Take 1 tablet by mouth at bedtime.    . carbamide peroxide (DEBROX) 6.5 % OTIC solution Place 5 drops into the left ear as needed.    . clopidogrel (PLAVIX) 75 MG tablet Take 75 mg by mouth daily.    . diclofenac sodium (VOLTAREN) 1 % GEL APPLY 1 APPLICATION TOPICALLY 4 (FOUR) TIMES DAILY AS NEEDED (MUSCLE PAIN). 100 g 3  . ibuprofen (ADVIL,MOTRIN) 400 MG tablet Take 400 mg by mouth as needed.    . Multiple Vitamin (MULTIVITAMIN) capsule Take 1 capsule by mouth daily.    . simvastatin (ZOCOR) 20 MG tablet Take 20 mg by mouth at bedtime.     . STUDY - Stroke TDSKA [id number] or Placebo Take by mouth.     No current facility-administered  medications for this visit.     On ROS today: 10 system ROS is negative unless otherwise noted in HPI   Physical Examination   Vitals:   07/23/17 1426 07/23/17 1428  BP: 123/81 139/81  Pulse: 64   Resp: 16   Temp: (!) 97.5 F (36.4 C)   TempSrc: Oral   SpO2: 100%   Weight: 172 lb (78 kg)   Height: 5\' 10"  (1.778 m)    Body mass index is 24.68 kg/m.  General Alert, O x 3, WD, NAD  Neck Supple, mid-line trachea,    Pulmonary Sym exp, good B air movt,   Cardiac RRR, Nl S1, S2,   Vascular Vessel Right Left  Radial Palpable Palpable  Brachial Palpable Palpable  Carotid Palpable, No Bruit Palpable, No Bruit  Aorta Not palpable N/A  Femoral Not examined Not examined  Popliteal Not palpable Not palpable  DP Palpable Palpable    Gastro- intestinal soft, non-distended, non-tender to palpation,  Musculo- skeletal M/S 5/5 throughout  , Extremities without ischemic changes    Neurologic Cranial nerves 2-12 intact     Non-Invasive Vascular Imaging   B Carotid Duplex (07/23/17):   R ICA stenosis:  1-39%  R VA:  patent and antegrade  L ICA stenosis:  1-39%  L VA:  patent and antegrade   Medical Decision  Making   Robert Mclaughlin is a 72 y.o. male who presents with mild bilateral ICA stenosis   Recent CVA unrelated to extracranial carotid system with only mild stenosis of proximal ICA by duplex today as well as a negative CTA neck during hospitalization  Follow-up as scheduled with outpatient neurology this week  Continue aspirin and statin daily from vascular surgery standpoint  Recheck carotid duplex in 2 years   Dagoberto Ligas, PA-C [facility]: [phone]

## 2017-07-31 ENCOUNTER — Ambulatory Visit (INDEPENDENT_AMBULATORY_CARE_PROVIDER_SITE_OTHER): Payer: Self-pay | Admitting: *Deleted

## 2017-07-31 DIAGNOSIS — Z8673 Personal history of transient ischemic attack (TIA), and cerebral infarction without residual deficits: Secondary | ICD-10-CM

## 2017-07-31 NOTE — Progress Notes (Signed)
ILR wound check in clinic. Steri-strips previously removed by patient. Incision approximated with scab present. Patient educated about wound care. No episodes recorded. R waves 0.69mV. High V rate threshold increased from 100 to 140bpm, symptom EGM storage changed from 8 min to 4 min (pre-trigger), bigeminy qualifier turned on. Monthly remote checks, ROV with GT PRN.

## 2017-08-08 ENCOUNTER — Telehealth: Payer: Self-pay | Admitting: Cardiovascular Disease

## 2017-08-08 ENCOUNTER — Telehealth: Payer: Self-pay

## 2017-08-08 NOTE — Telephone Encounter (Signed)
Called patient about our recommendations. Patient stated he has already had his cleaning and he does not want to take any antibiotics. Patient had other concerns. Made a separate phone note for patient's additional concerns.

## 2017-08-08 NOTE — Telephone Encounter (Signed)
Left message for Dentist office to call back.   Per Dr. Antionette Char note from last office visit on 11/06/16,  " History of cryptogenic stroke now status post transcatheter PFO closure.  The patient is doing well and should continue on long-term antiplatelet therapy with low-dose aspirin. He does not require SBE prophylaxis any longer for his heart. He may continue to follow SBE prophylaxis because of his knee prosthesis. I reviewed his echo images today which demonstrate normal device position with no evidence of color flow across the interatrial septum. I would be happy to see him back in the future if any problems arise, otherwise he can follow-up as needed. "  Dr. Johnsie Cancel is DOD today, and recommends patient to have SBE prophylaxis and he would not need to hold plavix and ASA for routine cleaning.   Will fax to [facility] Dentist office.

## 2017-08-08 NOTE — Telephone Encounter (Signed)
New message    1. What dental office are you calling from? [facility]  2. What is your office phone number? [phone], [place]  3. What is your fax number? [phone]  4. What type of procedure is the patient having performed? Routine cleaning  5. What date is procedure scheduled or is the patient there now?  08/08/17  6. What is your question (ex. Antibiotics prior to procedure, holding medication-we need to know how long dentist wants pt to hold med)? Plavix

## 2017-08-08 NOTE — Telephone Encounter (Signed)
   Primary Cardiologist:Michael Burt Knack, MD  Chart came to pre-op box. It appears this was handled by DOD team - see Pam's note below. Pam, I do not see that notes on sending in antibiotic or letting patient know. If you haven't done so, please call Mr. Schueler and let him know that Dr. Johnsie Cancel recommended SBE prophylaxis for his cleaning. In that case, if no allergies to penicillins or cephalopsporins (none listed), would advise to go ahead and send in rx amoxicillin 2000mg  30-60 minutes before cleaning to patient's preferred pharmacy. Thank you. Otherwise I will remove this message from the pre-op pool.  Charlie Pitter, PA-C 08/08/2017, 3:46 PM

## 2017-08-08 NOTE — Telephone Encounter (Signed)
Patient called wanting to know what he is suppose to take after he is done with a trial study for his recent stroke. Patient stated he is taking Aspirin 1000 mg and he is not taking Plavix per instructed by Dr. Leonie Man. Patient stated he was enrolled in this study after his CVA by Dr. Leonie Man. Informed patient that Dr. Leonie Man his neurologist should be answering this question. Patient stated he wanted to here from Dr. Burt Knack on what to take after the study. Will forward to Dr. Burt Knack and his nurse.

## 2017-08-15 ENCOUNTER — Ambulatory Visit (INDEPENDENT_AMBULATORY_CARE_PROVIDER_SITE_OTHER): Payer: Medicare Other | Admitting: Orthopedic Surgery

## 2017-08-15 ENCOUNTER — Encounter (INDEPENDENT_AMBULATORY_CARE_PROVIDER_SITE_OTHER): Payer: Self-pay | Admitting: Orthopedic Surgery

## 2017-08-15 DIAGNOSIS — R351 Nocturia: Secondary | ICD-10-CM | POA: Diagnosis not present

## 2017-08-15 DIAGNOSIS — Z96652 Presence of left artificial knee joint: Secondary | ICD-10-CM | POA: Diagnosis not present

## 2017-08-15 DIAGNOSIS — I639 Cerebral infarction, unspecified: Secondary | ICD-10-CM | POA: Diagnosis not present

## 2017-08-15 DIAGNOSIS — N401 Enlarged prostate with lower urinary tract symptoms: Secondary | ICD-10-CM | POA: Diagnosis not present

## 2017-08-15 DIAGNOSIS — R3912 Poor urinary stream: Secondary | ICD-10-CM | POA: Diagnosis not present

## 2017-08-15 DIAGNOSIS — R350 Frequency of micturition: Secondary | ICD-10-CM | POA: Diagnosis not present

## 2017-08-15 DIAGNOSIS — R972 Elevated prostate specific antigen [PSA]: Secondary | ICD-10-CM | POA: Diagnosis not present

## 2017-08-15 DIAGNOSIS — R3911 Hesitancy of micturition: Secondary | ICD-10-CM | POA: Diagnosis not present

## 2017-08-15 NOTE — Progress Notes (Signed)
Office Visit Note   Patient: Robert Mclaughlin           Date of Birth: [birth year]           MRN: [id number] Visit Date: 08/15/2017 Requested by: Lawerance Cruel, [place], [place] [address] PCP: Lawerance Cruel, MD  Subjective: Chief Complaint  Patient presents with  . Left Knee - Follow-up    HPI: Robert Mclaughlin is a 72 year old patient who is now about a year out from left total knee replacement.  He is doing well overall.  He is able to play golf go to the gym ride his bike and go up and down the stairs.  Has a little bit of soreness at night but is more on the right knee than the left knee.  3 weeks ago he had a stroke.  He is on aspirin for that.  Euflexxa did very well for his left knee for many years.  He would like to get approved for right knee Euflexxa which also has arthritis.              ROS: All systems reviewed are negative as they relate to the chief complaint within the history of present illness.  Patient denies  fevers or chills.   Assessment & Plan: Visit Diagnoses:  1. Presence of left artificial knee joint     Plan: Impression is well-functioning left total knee replacement.  Euflexxa to be preapproved for the right knee when his symptoms worsen.  Continue with nonweightbearing quad strengthening exercises.  Follow-up with me as needed  Follow-Up Instructions: Return if symptoms worsen or fail to improve.   Orders:  No orders of the defined types were placed in this encounter.  No orders of the defined types were placed in this encounter.     Procedures: No procedures performed   Clinical Data: No additional findings.  Objective: Vital Signs: There were no vitals taken for this visit.  Physical Exam:   Constitutional: Patient appears well-developed HEENT:  Head: Normocephalic Eyes:EOM are normal Neck: Normal range of motion Cardiovascular: Normal rate Pulmonary/chest: Effort normal Neurologic: Patient is alert Skin:  Skin is warm Psychiatric: Patient has normal mood and affect    Ortho Exam: Orthopedic exam demonstrates full active and passive range of motion of the right leg.  On the left he is off only about 10 degrees of flexion compared to the right and has full extension.  Quad strength is good and symmetric bilaterally with no effusion in that left knee.  Pedal pulses palpable.  Gait is normal.  Specialty Comments:  No specialty comments available.  Imaging: No results found.   PMFS History: Patient Active Problem List   Diagnosis Date Noted  . Carotid artery stenosis 07/23/2017  . Cryptogenic stroke ([place]) 07/16/2017  . Cerebellar stroke ([place]) 07/12/2017    Class: Acute  . Orthostatic hypotension 07/12/2017    Class: Acute  . PVC (premature ventricular contraction) 07/12/2017  . Stroke (cerebrum) ([place]) 07/12/2017  . Arthritis of knee 08/22/2016  . Primary osteoarthritis of left knee 04/28/2016  . PFO (patent foramen ovale) 04/11/2016  . Stroke due to embolism of left middle cerebral artery ([place]) 03/15/2005   Past Medical History:  Diagnosis Date  . Arthritis    "left knee" (04/11/2016)  . Carotid artery occlusion   . Dysrhythmia    iiregular heart beat yrs ago  . Migraine equivalent    "none in years" (04/11/2016)  . PFO (patent foramen  ovale)     dx 2008; closed 03/2016 by Dr Burt Knack  . Stroke [facility]) 2008   denies residual on 04/11/2016    Family History  Problem Relation Age of Onset  . Heart disease Mother        Heart Disease before age 81  . Varicose Veins Mother   . Hyperlipidemia Father     Past Surgical History:  Procedure Laterality Date  . LOOP RECORDER INSERTION N/A 07/16/2017   Procedure: LOOP RECORDER INSERTION;  Surgeon: Evans Lance, MD;  Location: [place] [facility];  Service: Cardiovascular;  Laterality: N/A;  . MASS EXCISION Right 2017   "precancerous growth on my leg"  . MOUTH SURGERY     tooth extraction and root extraction   . PATENT FORAMEN OVALE  CLOSURE  04/11/2016  . PATENT FORAMEN OVALE CLOSURE N/A 04/11/2016   Procedure: Patent Forament Ovale(PFO) Closure;  Surgeon: Sherren Mocha, MD;  Location: [place] [facility];  Service: Cardiovascular;  Laterality: N/A;  . TEE WITHOUT CARDIOVERSION N/A 07/16/2017   Procedure: TRANSESOPHAGEAL ECHOCARDIOGRAM (TEE);  Surgeon: Lelon Perla, MD;  Location: [facility];  Service: Cardiovascular;  Laterality: N/A;  . TONSILLECTOMY    . TOOTH EXTRACTION    . TOTAL KNEE ARTHROPLASTY Left 08/22/2016   Procedure: LEFT TOTAL KNEE ARTHROPLASTY;  Surgeon: Meredith Pel, MD;  Location: [place];  Service: Orthopedics;  Laterality: Left;   Social History   Occupational History  . Not on file  Tobacco Use  . Smoking status: Former Smoker    Packs/day: 1.00    Years: 6.00    Pack years: 6.00    Last attempt to quit: 03/13/1969    Years since quitting: 48.4  . Smokeless tobacco: Never Used  Substance and Sexual Activity  . Alcohol use: Yes    Alcohol/week: 8.4 oz    Types: 14 Cans of beer per week    Comment: 2 drinks per day  . Drug use: No  . Sexual activity: Yes

## 2017-08-16 ENCOUNTER — Telehealth (INDEPENDENT_AMBULATORY_CARE_PROVIDER_SITE_OTHER): Payer: Self-pay

## 2017-08-16 ENCOUNTER — Ambulatory Visit (INDEPENDENT_AMBULATORY_CARE_PROVIDER_SITE_OTHER): Payer: Medicare Other | Admitting: *Deleted

## 2017-08-16 DIAGNOSIS — Z8673 Personal history of transient ischemic attack (TIA), and cerebral infarction without residual deficits: Secondary | ICD-10-CM | POA: Diagnosis not present

## 2017-08-16 NOTE — Telephone Encounter (Signed)
Submitted application online for Euflexxa injection, right knee.

## 2017-08-16 NOTE — Progress Notes (Signed)
Loop recorder 

## 2017-08-17 NOTE — Telephone Encounter (Signed)
Reviewed records from hospital and he should take antiplatelet/anticoagulant medications per the study protocol - outlined in Dr Clydene Fake progress notes. thx

## 2017-08-17 NOTE — Telephone Encounter (Signed)
Per DPR form, left message for patient that Dr. Burt Knack reviewed records and agreed he should take medications as outlined by study protocol and Dr. Leonie Man. Instructed him to call with further questions or concerns.

## 2017-09-07 ENCOUNTER — Ambulatory Visit (INDEPENDENT_AMBULATORY_CARE_PROVIDER_SITE_OTHER): Payer: Medicare Other | Admitting: Orthopedic Surgery

## 2017-09-07 ENCOUNTER — Encounter (INDEPENDENT_AMBULATORY_CARE_PROVIDER_SITE_OTHER): Payer: Self-pay | Admitting: Orthopedic Surgery

## 2017-09-07 DIAGNOSIS — M1711 Unilateral primary osteoarthritis, right knee: Secondary | ICD-10-CM | POA: Diagnosis not present

## 2017-09-07 MED ORDER — BUPIVACAINE HCL 0.25 % IJ SOLN
4.0000 mL | INTRAMUSCULAR | Status: AC | PRN
  Administered 2017-09-07: 4 mL via INTRA_ARTICULAR

## 2017-09-07 MED ORDER — LIDOCAINE HCL 1 % IJ SOLN
5.0000 mL | INTRAMUSCULAR | Status: AC | PRN
  Administered 2017-09-07: 5 mL

## 2017-09-07 MED ORDER — METHYLPREDNISOLONE ACETATE 40 MG/ML IJ SUSP
40.0000 mg | INTRAMUSCULAR | Status: AC | PRN
  Administered 2017-09-07: 40 mg via INTRA_ARTICULAR

## 2017-09-07 NOTE — Progress Notes (Signed)
   Procedure Note  Patient: Robert Mclaughlin             Date of Birth: [birth year]           MRN: [id number]             Visit Date: 09/07/2017  Procedures: Visit Diagnoses: Unilateral primary osteoarthritis, right knee  Large Joint Inj: R knee on 09/07/2017 9:44 AM Indications: diagnostic evaluation, joint swelling and pain Details: 18 G 1.5 in needle, superolateral approach  Arthrogram: No  Medications: 5 mL lidocaine 1 %; 40 mg methylPREDNISolone acetate 40 MG/ML; 4 mL bupivacaine 0.25 % Outcome: tolerated well, no immediate complications Procedure, treatment alternatives, risks and benefits explained, specific risks discussed. Consent was given by the patient. Immediately prior to procedure a time out was called to verify the correct patient, procedure, equipment, support staff and site/side marked as required. Patient was prepped and draped in the usual sterile fashion.

## 2017-09-14 ENCOUNTER — Encounter (INDEPENDENT_AMBULATORY_CARE_PROVIDER_SITE_OTHER): Payer: Self-pay | Admitting: Orthopedic Surgery

## 2017-09-14 ENCOUNTER — Ambulatory Visit (INDEPENDENT_AMBULATORY_CARE_PROVIDER_SITE_OTHER): Payer: Medicare Other | Admitting: Orthopedic Surgery

## 2017-09-14 VITALS — Ht 70.0 in | Wt 172.0 lb

## 2017-09-14 DIAGNOSIS — M1711 Unilateral primary osteoarthritis, right knee: Secondary | ICD-10-CM | POA: Diagnosis not present

## 2017-09-14 MED ORDER — SODIUM HYALURONATE (VISCOSUP) 20 MG/2ML IX SOSY
20.0000 mg | PREFILLED_SYRINGE | INTRA_ARTICULAR | Status: AC | PRN
  Administered 2017-09-14: 20 mg via INTRA_ARTICULAR

## 2017-09-14 MED ORDER — LIDOCAINE HCL 1 % IJ SOLN
5.0000 mL | INTRAMUSCULAR | Status: AC | PRN
  Administered 2017-09-14: 5 mL

## 2017-09-14 NOTE — Progress Notes (Signed)
   Procedure Note  Patient: Robert Mclaughlin             Date of Birth: 24-Oct-1945           MRN: [id number]             Visit Date: 09/14/2017  Procedures: Visit Diagnoses: Unilateral primary osteoarthritis, right knee  Large Joint Inj: R knee on 09/14/2017 9:36 AM Indications: pain, joint swelling and diagnostic evaluation Details: 18 G 1.5 in needle, superolateral approach  Arthrogram: No  Medications: 5 mL lidocaine 1 %; 20 mg Sodium Hyaluronate 20 MG/2ML Outcome: tolerated well, no immediate complications Procedure, treatment alternatives, risks and benefits explained, specific risks discussed. Consent was given by the patient. Immediately prior to procedure a time out was called to verify the correct patient, procedure, equipment, support staff and site/side marked as required. Patient was prepped and draped in the usual sterile fashion.

## 2017-09-16 ENCOUNTER — Other Ambulatory Visit: Payer: Self-pay | Admitting: Neurology

## 2017-09-16 DIAGNOSIS — I6300 Cerebral infarction due to thrombosis of unspecified precerebral artery: Secondary | ICD-10-CM

## 2017-09-18 ENCOUNTER — Ambulatory Visit (INDEPENDENT_AMBULATORY_CARE_PROVIDER_SITE_OTHER): Payer: Medicare Other | Admitting: *Deleted

## 2017-09-18 DIAGNOSIS — I639 Cerebral infarction, unspecified: Secondary | ICD-10-CM | POA: Diagnosis not present

## 2017-09-19 NOTE — Progress Notes (Signed)
Loop Recorder  

## 2017-09-21 ENCOUNTER — Telehealth (INDEPENDENT_AMBULATORY_CARE_PROVIDER_SITE_OTHER): Payer: Self-pay

## 2017-09-21 ENCOUNTER — Encounter (INDEPENDENT_AMBULATORY_CARE_PROVIDER_SITE_OTHER): Payer: Self-pay | Admitting: Orthopedic Surgery

## 2017-09-21 ENCOUNTER — Ambulatory Visit (INDEPENDENT_AMBULATORY_CARE_PROVIDER_SITE_OTHER): Payer: Medicare Other | Admitting: Orthopedic Surgery

## 2017-09-21 DIAGNOSIS — M1711 Unilateral primary osteoarthritis, right knee: Secondary | ICD-10-CM

## 2017-09-21 NOTE — Telephone Encounter (Signed)
Can we get pre-approval for repeat euflexxa series for patient in 6 mo? Finished last injection today in right knee.

## 2017-09-21 NOTE — Telephone Encounter (Signed)
Noted  

## 2017-09-23 DIAGNOSIS — M1711 Unilateral primary osteoarthritis, right knee: Secondary | ICD-10-CM

## 2017-09-23 MED ORDER — LIDOCAINE HCL 1 % IJ SOLN
5.0000 mL | INTRAMUSCULAR | Status: AC | PRN
  Administered 2017-09-23: 5 mL

## 2017-09-23 MED ORDER — SODIUM HYALURONATE (VISCOSUP) 20 MG/2ML IX SOSY
20.0000 mg | PREFILLED_SYRINGE | INTRA_ARTICULAR | Status: AC | PRN
  Administered 2017-09-23: 20 mg via INTRA_ARTICULAR

## 2017-09-23 NOTE — Progress Notes (Signed)
   Procedure Note  Patient: Robert Mclaughlin             Date of Birth: 1946-01-19           MRN: [id number]             Visit Date: 09/21/2017  Procedures: Visit Diagnoses: Unilateral primary osteoarthritis, right knee  Large Joint Inj: R knee on 09/23/2017 8:22 AM Indications: pain, joint swelling and diagnostic evaluation Details: 18 G 1.5 in needle, superolateral approach  Arthrogram: No  Medications: 5 mL lidocaine 1 %; 20 mg Sodium Hyaluronate 20 MG/2ML Outcome: tolerated well, no immediate complications Procedure, treatment alternatives, risks and benefits explained, specific risks discussed. Consent was given by the patient. Immediately prior to procedure a time out was called to verify the correct patient, procedure, equipment, support staff and site/side marked as required. Patient was prepped and draped in the usual sterile fashion.

## 2017-09-24 ENCOUNTER — Telehealth (INDEPENDENT_AMBULATORY_CARE_PROVIDER_SITE_OTHER): Payer: Self-pay

## 2017-09-24 NOTE — Telephone Encounter (Signed)
Submitted application online for Euflexxa, right knee.

## 2017-10-01 ENCOUNTER — Encounter (HOSPITAL_COMMUNITY): Payer: Medicare Other

## 2017-10-01 ENCOUNTER — Ambulatory Visit: Payer: Medicare Other | Admitting: Surgery

## 2017-10-08 ENCOUNTER — Ambulatory Visit (HOSPITAL_COMMUNITY)
Admission: RE | Admit: 2017-10-08 | Discharge: 2017-10-08 | Disposition: A | Discharge status: Home / Self Care | Payer: Medicare Other | Source: Ambulatory Visit | Attending: Neurology | Admitting: Neurology

## 2017-10-08 DIAGNOSIS — I639 Cerebral infarction, unspecified: Secondary | ICD-10-CM | POA: Diagnosis not present

## 2017-10-08 DIAGNOSIS — I6300 Cerebral infarction due to thrombosis of unspecified precerebral artery: Secondary | ICD-10-CM

## 2017-10-09 LAB — CUP PACEART REMOTE DEVICE CHECK
Date Time Interrogation Session: 20190730104227
MDC IDC PG IMPLANT DT: 20190506
MDC IDC PG SERIAL: 9374188

## 2017-10-13 LAB — CUP PACEART REMOTE DEVICE CHECK
Date Time Interrogation Session: 20190707101639
MDC IDC PG IMPLANT DT: 20190506
Pulse Gen Serial Number: 9374188

## 2017-10-15 ENCOUNTER — Ambulatory Visit: Payer: Medicare Other | Admitting: Surgery

## 2017-10-15 ENCOUNTER — Encounter (HOSPITAL_COMMUNITY): Payer: Medicare Other

## 2017-10-19 ENCOUNTER — Ambulatory Visit (INDEPENDENT_AMBULATORY_CARE_PROVIDER_SITE_OTHER): Payer: Medicare Other | Admitting: *Deleted

## 2017-10-19 DIAGNOSIS — I639 Cerebral infarction, unspecified: Secondary | ICD-10-CM | POA: Diagnosis not present

## 2017-10-19 NOTE — Progress Notes (Signed)
Carelink Summary Report / Loop Recorder 

## 2017-11-02 DIAGNOSIS — H401131 Primary open-angle glaucoma, bilateral, mild stage: Secondary | ICD-10-CM | POA: Diagnosis not present

## 2017-11-09 DIAGNOSIS — Z1283 Encounter for screening for malignant neoplasm of skin: Secondary | ICD-10-CM | POA: Diagnosis not present

## 2017-11-09 DIAGNOSIS — D225 Melanocytic nevi of trunk: Secondary | ICD-10-CM | POA: Diagnosis not present

## 2017-11-15 ENCOUNTER — Encounter: Payer: Medicare Other | Admitting: *Deleted

## 2017-11-15 ENCOUNTER — Telehealth: Payer: Self-pay | Admitting: Cardiology

## 2017-11-15 NOTE — Telephone Encounter (Signed)
LMOVM reminding pt to send remote transmission.   

## 2017-11-16 ENCOUNTER — Encounter: Payer: Self-pay | Admitting: Cardiology

## 2017-11-19 ENCOUNTER — Ambulatory Visit (INDEPENDENT_AMBULATORY_CARE_PROVIDER_SITE_OTHER): Payer: Medicare Other | Admitting: *Deleted

## 2017-11-19 DIAGNOSIS — I639 Cerebral infarction, unspecified: Secondary | ICD-10-CM | POA: Diagnosis not present

## 2017-11-19 NOTE — Progress Notes (Signed)
Loop Recorder  

## 2017-11-21 DIAGNOSIS — N401 Enlarged prostate with lower urinary tract symptoms: Secondary | ICD-10-CM | POA: Diagnosis not present

## 2017-11-21 DIAGNOSIS — R3912 Poor urinary stream: Secondary | ICD-10-CM | POA: Diagnosis not present

## 2017-11-21 DIAGNOSIS — R972 Elevated prostate specific antigen [PSA]: Secondary | ICD-10-CM | POA: Diagnosis not present

## 2017-11-26 ENCOUNTER — Telehealth: Payer: Self-pay | Admitting: Cardiovascular Disease

## 2017-11-26 NOTE — Telephone Encounter (Signed)
This chart is confusing on SBE prophylaxis-- I will send to Dr. Burt Knack to clarify.

## 2017-11-26 NOTE — Telephone Encounter (Signed)
New message   Per Marianna Fuss there was a Dental Clearance that was done on 08/08/2017. Kerri needs clarification on the premed to see if the patient's needs to continue with the premed. The patient is having a routine cleaning on 02/11/2018.  Please call to discuss.

## 2017-11-27 NOTE — Telephone Encounter (Signed)
He is out of the window for SBE prophylaxis for PFO closure - only needs to follow that for 6 months after implantation. thx

## 2017-11-27 NOTE — Telephone Encounter (Signed)
Routing recs back to [employer] Fax # [phone].  No need for SBE prophylaxis   Will remove from preop pool.

## 2017-11-28 DIAGNOSIS — Z23 Encounter for immunization: Secondary | ICD-10-CM | POA: Diagnosis not present

## 2017-11-28 LAB — CUP PACEART REMOTE DEVICE CHECK
Implantable Pulse Generator Implant Date: 20190506
MDC IDC SESS DTM: 20190808113047
Pulse Gen Serial Number: 9374188

## 2017-12-11 LAB — CUP PACEART REMOTE DEVICE CHECK
Implantable Pulse Generator Implant Date: 20190506
MDC IDC SESS DTM: 20190909102846
Pulse Gen Serial Number: 9374188

## 2017-12-20 ENCOUNTER — Ambulatory Visit (INDEPENDENT_AMBULATORY_CARE_PROVIDER_SITE_OTHER): Payer: Medicare Other | Admitting: *Deleted

## 2017-12-20 DIAGNOSIS — I639 Cerebral infarction, unspecified: Secondary | ICD-10-CM | POA: Diagnosis not present

## 2017-12-20 NOTE — Progress Notes (Signed)
Loop Recorder  

## 2017-12-27 ENCOUNTER — Encounter: Payer: Self-pay | Admitting: Cardiology

## 2018-01-21 ENCOUNTER — Ambulatory Visit (INDEPENDENT_AMBULATORY_CARE_PROVIDER_SITE_OTHER): Payer: Medicare Other | Admitting: *Deleted

## 2018-01-21 DIAGNOSIS — I639 Cerebral infarction, unspecified: Secondary | ICD-10-CM | POA: Diagnosis not present

## 2018-01-21 NOTE — Progress Notes (Signed)
ILR

## 2018-02-21 ENCOUNTER — Ambulatory Visit (INDEPENDENT_AMBULATORY_CARE_PROVIDER_SITE_OTHER): Payer: Medicare Other

## 2018-02-21 DIAGNOSIS — I639 Cerebral infarction, unspecified: Secondary | ICD-10-CM | POA: Diagnosis not present

## 2018-02-22 ENCOUNTER — Encounter: Payer: Self-pay | Admitting: Cardiology

## 2018-02-22 NOTE — Progress Notes (Signed)
Loop Recorder  

## 2018-03-02 LAB — CUP PACEART REMOTE DEVICE CHECK
Date Time Interrogation Session: 20191010080955
MDC IDC PG IMPLANT DT: 20190506
Pulse Gen Serial Number: 9374188

## 2018-03-09 DIAGNOSIS — N390 Urinary tract infection, site not specified: Secondary | ICD-10-CM | POA: Diagnosis not present

## 2018-03-12 ENCOUNTER — Other Ambulatory Visit: Payer: Self-pay | Admitting: Internal Medicine

## 2018-03-12 LAB — CUP PACEART REMOTE DEVICE CHECK
Implantable Pulse Generator Implant Date: 20190506
MDC IDC PG SERIAL: 9374188
MDC IDC SESS DTM: 20191110095422

## 2018-03-14 DIAGNOSIS — N390 Urinary tract infection, site not specified: Secondary | ICD-10-CM | POA: Diagnosis not present

## 2018-03-14 DIAGNOSIS — R351 Nocturia: Secondary | ICD-10-CM | POA: Diagnosis not present

## 2018-03-14 DIAGNOSIS — N401 Enlarged prostate with lower urinary tract symptoms: Secondary | ICD-10-CM | POA: Diagnosis not present

## 2018-03-25 ENCOUNTER — Ambulatory Visit (INDEPENDENT_AMBULATORY_CARE_PROVIDER_SITE_OTHER): Payer: Medicare Other

## 2018-03-25 DIAGNOSIS — I639 Cerebral infarction, unspecified: Secondary | ICD-10-CM | POA: Diagnosis not present

## 2018-03-26 NOTE — Progress Notes (Signed)
Cardiology Office Note   Date:  03/29/2018   ID:  Robert Mclaughlin, DOB 12-28-1945, MRN [id number]  PCP:  Lawerance Cruel, MD  Cardiologist:   Jenkins Rouge, MD   No chief complaint on file.     History of Present Illness: Robert Mclaughlin is a 73 y.o. male who presents for f/u of PFO closure. I have not seen him since January 2018 At that time he was a pre operative evaluation for left TKR. Former smoker with ? CVA in 2008 Rx with plavix noted to have PFO. Subsequently had PFO closure with Dr Burt Knack on 03/22/16 Then he had left TKR with Dr Alvan Dame with no neurologic sequelae. Plavix d/c after 6 months and no longer needs SBE prophylaxis   Admitted 07/16/17 with CVA to the cerebellum and left parietal lobe with vertigo TTE 07/13/17 reviewed and showed normal EF 55% with mild AR/MR TEE showed normal EF with MVP anterior leaflet moderate MR mild AR PFO closure intact. No right to left shunt on bubble. Carotids with no severe stenosis Had ILR placed for cryptogenic stroke r/o PAF.   Down sized house this year and has not traveled as much No residual deficits neurologically  He is from [place] and went to [place]. Retired and travels a lot  Past Medical History:  Diagnosis Date  . Arthritis    "left knee" (04/11/2016)  . Carotid artery occlusion   . Dysrhythmia    iiregular heart beat yrs ago  . Migraine equivalent    "none in years" (04/11/2016)  . PFO (patent foramen ovale)     dx 2008; closed 03/2016 by Dr Burt Knack  . Stroke [facility]) 2008   denies residual on 04/11/2016    Past Surgical History:  Procedure Laterality Date  . LOOP RECORDER INSERTION N/A 07/16/2017   Procedure: LOOP RECORDER INSERTION;  Surgeon: Evans Lance, MD;  Location: [place] [facility];  Service: Cardiovascular;  Laterality: N/A;  . MASS EXCISION Right 2017   "precancerous growth on my leg"  . MOUTH SURGERY     tooth extraction and root extraction   . PATENT FORAMEN OVALE CLOSURE  04/11/2016  . PATENT  FORAMEN OVALE CLOSURE N/A 04/11/2016   Procedure: Patent Forament Ovale(PFO) Closure;  Surgeon: Sherren Mocha, MD;  Location: [place] [facility];  Service: Cardiovascular;  Laterality: N/A;  . TEE WITHOUT CARDIOVERSION N/A 07/16/2017   Procedure: TRANSESOPHAGEAL ECHOCARDIOGRAM (TEE);  Surgeon: Lelon Perla, MD;  Location: [facility] ENDOSCOPY;  Service: Cardiovascular;  Laterality: N/A;  . TONSILLECTOMY    . TOOTH EXTRACTION    . TOTAL KNEE ARTHROPLASTY Left 08/22/2016   Procedure: LEFT TOTAL KNEE ARTHROPLASTY;  Surgeon: Meredith Pel, MD;  Location: [facility];  Service: Orthopedics;  Laterality: Left;     Current Outpatient Medications  Medication Sig Dispense Refill  . ARTIFICIAL TEAR OP Place 1 drop into both eyes daily.     [name] Kitchen aspirin 325 MG tablet Take 81 mg by mouth daily.     [name] Kitchen b complex vitamins tablet Take 1 tablet by mouth at bedtime.    . carbamide peroxide (DEBROX) 6.5 % OTIC solution Place 5 drops into the left ear as needed.    . clopidogrel (PLAVIX) 75 MG tablet Take 75 mg by mouth daily.    . diclofenac sodium (VOLTAREN) 1 % GEL APPLY 1 APPLICATION TOPICALLY 4 (FOUR) TIMES DAILY AS NEEDED (MUSCLE PAIN). 100 g 3  . ibuprofen (ADVIL,MOTRIN) 400 MG tablet Take 400 mg  by mouth as needed.    . Multiple Vitamin (MULTIVITAMIN) capsule Take 1 capsule by mouth daily.    . Saw Palmetto 500 MG CAPS Take 1 capsule by mouth daily.    . simvastatin (ZOCOR) 20 MG tablet Take 20 mg by mouth at bedtime.     . tamsulosin (FLOMAX) 0.4 MG CAPS capsule Take 1 capsule by mouth daily.     No current facility-administered medications for this visit.     Allergies:   Oxycodone; Latanoprost; and Lumigan [bimatoprost]    Social History:  The patient  reports that he quit smoking about 49 years ago. He has a 6.00 pack-year smoking history. He has never used smokeless tobacco. He reports current alcohol use of about 14.0 standard drinks of alcohol per week. He reports that he does not use drugs.    Family History:  The patient's family history includes Heart disease in his mother; Hyperlipidemia in his father; Varicose Veins in his mother.    ROS:  Please see the history of present illness.   Otherwise, review of systems are positive for none.   All other systems are reviewed and negative.    PHYSICAL EXAM: VS:  BP 104/68   Pulse 71   Ht 5\' 10"  (1.778 m)   Wt 173 lb 2 oz (78.5 kg)   SpO2 98%   BMI 24.84 kg/m  , BMI Body mass index is 24.84 kg/m. Affect appropriate Healthy:  appears stated age 48: normal Neck supple with no adenopathy JVP normal no bruits no thyromegaly Lungs clear with no wheezing and good diaphragmatic motion Heart:  S1/S2 no murmur, no rub, gallop or click PMI normal ILR under left pectoral area  Abdomen: benighn, BS positve, no tenderness, no AAA no bruit.  No HSM or HJR Distal pulses intact with no bruits No edema Neuro non-focal Skin warm and dry Post left TKR      EKG:  07/12/17 SR rate 65 ICRBBB PVC    Recent Labs: 07/12/2017: BUN 19; Creatinine, Ser 1.10; Hemoglobin 13.9; Magnesium 2.0; Platelets 162; Potassium 4.7; Sodium 139    Lipid Panel    Component Value Date/Time   CHOL 108 07/13/2017 0515   TRIG 51 07/13/2017 0515   HDL 46 07/13/2017 0515   CHOLHDL 2.3 07/13/2017 0515   VLDL 10 07/13/2017 0515   LDLCALC 52 07/13/2017 0515      Wt Readings from Last 3 Encounters:  03/29/18 173 lb 2 oz (78.5 kg)  09/14/17 172 lb (78 kg)  07/23/17 172 lb (78 kg)      Other studies Reviewed: Additional studies/ records that were reviewed today include: Notes from Dr Lovena Le and Burt Knack Notes from cardiology 2018 on. Labs, ECG TTE , TEE MRI brain .    ASSESSMENT AND PLAN:  1.  PFO closure:  Intact by TEE done 07/16/17  Continue ASA no need for plavix or SBE at this time 2. CVA: recurrent after PFO closure carotids ok ILR no PAF f/u with Dr Leonie Man Stroke study 3. HLD:  Continue statin labs with primary  4. Ortho: post left TKR f/u Dr  Alvan Dame Improvied    Current medicines are reviewed at length with the patient today.  The patient does not have concerns regarding medicines.  The following changes have been made:  no change  Labs/ tests ordered today include: None  No orders of the defined types were placed in this encounter.    Disposition:   FU with me in a year ILR  monitoring through EP     Signed, Jenkins Rouge, MD  03/29/2018 8:26 AM    [facility] [address], [place], [place]  [address] Phone: [phone]; Fax: [phone]

## 2018-03-27 LAB — CUP PACEART REMOTE DEVICE CHECK
Implantable Pulse Generator Implant Date: 20190506
MDC IDC SESS DTM: 20200111090038
Pulse Gen Serial Number: 9374188

## 2018-03-28 DIAGNOSIS — R351 Nocturia: Secondary | ICD-10-CM | POA: Diagnosis not present

## 2018-03-28 DIAGNOSIS — N3000 Acute cystitis without hematuria: Secondary | ICD-10-CM | POA: Diagnosis not present

## 2018-03-28 DIAGNOSIS — N401 Enlarged prostate with lower urinary tract symptoms: Secondary | ICD-10-CM | POA: Diagnosis not present

## 2018-03-28 DIAGNOSIS — N410 Acute prostatitis: Secondary | ICD-10-CM | POA: Diagnosis not present

## 2018-03-29 ENCOUNTER — Other Ambulatory Visit: Payer: Self-pay | Admitting: Internal Medicine

## 2018-03-29 ENCOUNTER — Ambulatory Visit (INDEPENDENT_AMBULATORY_CARE_PROVIDER_SITE_OTHER): Payer: Medicare Other | Admitting: Cardiovascular Disease

## 2018-03-29 ENCOUNTER — Encounter: Payer: Self-pay | Admitting: Cardiovascular Disease

## 2018-03-29 VITALS — BP 104/68 | HR 71 | Ht 70.0 in | Wt 173.1 lb

## 2018-03-29 DIAGNOSIS — Q2112 Patent foramen ovale: Secondary | ICD-10-CM

## 2018-03-29 DIAGNOSIS — I639 Cerebral infarction, unspecified: Secondary | ICD-10-CM | POA: Diagnosis not present

## 2018-03-29 DIAGNOSIS — Q211 Atrial septal defect: Secondary | ICD-10-CM | POA: Diagnosis not present

## 2018-03-29 NOTE — Patient Instructions (Addendum)

## 2018-04-06 LAB — CUP PACEART REMOTE DEVICE CHECK
Date Time Interrogation Session: 20191212082329
MDC IDC PG IMPLANT DT: 20190506
MDC IDC PG SERIAL: 9374188

## 2018-04-07 DIAGNOSIS — L500 Allergic urticaria: Secondary | ICD-10-CM | POA: Diagnosis not present

## 2018-04-15 ENCOUNTER — Telehealth: Payer: Self-pay | Admitting: Cardiovascular Disease

## 2018-04-15 NOTE — Telephone Encounter (Signed)
     [facility] Pre-operative Risk Assessment    Request for surgical clearance:  1. What type of surgery is being performed? Colonoscopy  2. When is this surgery scheduled? 05/20/18   3. What type of clearance is required (medical clearance vs. Pharmacy clearance to hold med vs. Both)? Medical, Dr Cristina Gong wants to know if there is limitations due to his loop recorder such as using a cautery   4. Are there any medications that need to be held prior to surgery and how long?   5. Practice name and name of physician performing surgery? Dr Cristina Gong, [facility]  6. What is your office phone number [phone]   7.   What is your office fax number [phone]  8.   Anesthesia type (None, local, MAC, general) ? Propofol   Robert Mclaughlin 04/15/2018, 2:31 PM  _________________________________________________________________   (provider comments below)

## 2018-04-16 NOTE — Telephone Encounter (Signed)
In consultation with MD, there is no contraindication to using cautery with ILR in place.  Attempted to call patient to complete clearance. Left VM to call back.

## 2018-04-18 NOTE — Telephone Encounter (Signed)
Ok to hold ASA for colonoscopy

## 2018-04-18 NOTE — Telephone Encounter (Signed)
Dr. Lovena Le  Are there any limitations with procedure with ILR?  Such as cautery ?

## 2018-04-18 NOTE — Telephone Encounter (Signed)
   Primary Cardiologist: Jenkins Rouge, MD  Chart reviewed as part of pre-operative protocol coverage. Patient was contacted 04/18/2018 in reference to pre-operative risk assessment for pending surgery as outlined below.  Robert Mclaughlin was last seen on 03/29/18 by Dr. Johnsie Cancel.  Since that day, Robert Mclaughlin has done well without any cardiac changes.  I checked with Dr. Lovena Le and no interaction or precautions with cautery and loop recorder.  And if needed he could hold his ASA for 3 days.   Therefore, based on ACC/AHA guidelines, the patient would be at acceptable risk for the planned procedure without further cardiovascular testing.   I will route this recommendation to the requesting party via Epic fax function and remove from pre-op pool.  Please call with questions.  Cecilie Kicks, NP 04/18/2018, 4:26 PM

## 2018-04-18 NOTE — Telephone Encounter (Signed)
Dr. Johnsie Cancel if need be could pt hold ASA for colonoscopy?  Please send results to pre-op pool

## 2018-04-22 NOTE — Telephone Encounter (Signed)
Agree. GT 

## 2018-04-24 NOTE — Telephone Encounter (Signed)
Note routed to [facility] via Epic.

## 2018-04-26 ENCOUNTER — Ambulatory Visit (INDEPENDENT_AMBULATORY_CARE_PROVIDER_SITE_OTHER): Payer: Medicare Other

## 2018-04-26 DIAGNOSIS — I639 Cerebral infarction, unspecified: Secondary | ICD-10-CM | POA: Diagnosis not present

## 2018-04-29 LAB — CUP PACEART REMOTE DEVICE CHECK
Date Time Interrogation Session: 20200214080743
MDC IDC PG IMPLANT DT: 20190506
Pulse Gen Serial Number: 9374188

## 2018-05-02 DIAGNOSIS — K4090 Unilateral inguinal hernia, without obstruction or gangrene, not specified as recurrent: Secondary | ICD-10-CM | POA: Diagnosis not present

## 2018-05-06 ENCOUNTER — Other Ambulatory Visit: Payer: Self-pay | Admitting: Internal Medicine

## 2018-05-06 DIAGNOSIS — H524 Presbyopia: Secondary | ICD-10-CM | POA: Diagnosis not present

## 2018-05-06 DIAGNOSIS — H401131 Primary open-angle glaucoma, bilateral, mild stage: Secondary | ICD-10-CM | POA: Diagnosis not present

## 2018-05-07 NOTE — Progress Notes (Signed)
ILR

## 2018-05-16 ENCOUNTER — Other Ambulatory Visit: Payer: Self-pay | Admitting: Internal Medicine

## 2018-05-20 DIAGNOSIS — D123 Benign neoplasm of transverse colon: Secondary | ICD-10-CM | POA: Diagnosis not present

## 2018-05-20 DIAGNOSIS — Z86010 Personal history of colonic polyps: Secondary | ICD-10-CM | POA: Diagnosis not present

## 2018-05-22 DIAGNOSIS — D123 Benign neoplasm of transverse colon: Secondary | ICD-10-CM | POA: Diagnosis not present

## 2018-05-27 ENCOUNTER — Ambulatory Visit (INDEPENDENT_AMBULATORY_CARE_PROVIDER_SITE_OTHER): Payer: Medicare Other | Admitting: *Deleted

## 2018-05-27 ENCOUNTER — Ambulatory Visit: Payer: Self-pay | Admitting: Surgery

## 2018-05-27 ENCOUNTER — Other Ambulatory Visit: Payer: Self-pay

## 2018-05-27 DIAGNOSIS — I639 Cerebral infarction, unspecified: Secondary | ICD-10-CM

## 2018-05-27 DIAGNOSIS — K4090 Unilateral inguinal hernia, without obstruction or gangrene, not specified as recurrent: Secondary | ICD-10-CM | POA: Diagnosis not present

## 2018-05-27 NOTE — H&P (Signed)
Melonie [place] Documented: 05/27/2018 10:32 AM Location: [facility] Patient #: [id number] DOB: [birth year] Married / Language: [name] / Race: White Male  History of Present Illness [name] A. Rambo Sarafian MD; 05/27/2018 11:33 AM) Patient words: Patient presents with a chief complaint of bulge in his right groin. He's had it for 2 weeks. He noticed it while showering. He saw causing any pain and does not seem to get worse with coughing or straining. He does have some mild indigestion as his other complaint is intermittent without nausea vomiting or change in bowel or bladder function. There's been no redness or tenderness in his right groin.  The patient is a 73 year old male.   Past Surgical History Sabino Gasser, CMA; 05/27/2018 10:32 AM) Colon Polyp Removal - Colonoscopy Knee Surgery Left. Oral Surgery  Diagnostic Studies History Sabino Gasser, CMA; 05/27/2018 10:32 AM) Colonoscopy within last year  Allergies Sabino Gasser, [place]; 05/27/2018 10:33 AM) No Known Drug Allergies [05/27/2018]: Allergies Reconciled  Medication History Sabino Gasser, CMA; 05/27/2018 10:35 AM) [place] with Flavor Pack (420GM For Solution, Oral) Active. Tamsulosin HCl (0.4MG  Capsule, Oral) Active. Saw Palmetto (80MG  Capsule, Oral) Active. Voltaren (1% Gel, Transdermal) Active. Medications Reconciled  Other Problems Sabino Gasser, [place]; 05/27/2018 10:32 AM) Cerebrovascular Accident Enlarged Prostate Hemorrhoids Inguinal Hernia     Review of Systems Sabino Gasser CMA; 05/27/2018 10:32 AM) Skin Present- Dryness. Not Present- Change in Wart/Mole, Hives, Jaundice, New Lesions, Non-Healing Wounds, Rash and Ulcer. HEENT Present- Ringing in the Ears and Wears glasses/contact lenses. Not Present- Earache, Hearing Loss, Hoarseness, Nose Bleed, Oral Ulcers, Seasonal Allergies, Sinus Pain, Sore Throat, Visual Disturbances and Yellow Eyes. Respiratory Present- Snoring. Not Present-  Bloody sputum, Chronic Cough, Difficulty Breathing and Wheezing. Breast Not Present- Breast Mass, Breast Pain, Nipple Discharge and Skin Changes. Cardiovascular Not Present- Chest Pain, Difficulty Breathing Lying Down, Leg Cramps, Palpitations, Rapid Heart Rate, Shortness of Breath and Swelling of Extremities. Gastrointestinal Present- Indigestion. Not Present- Abdominal Pain, Bloating, Bloody Stool, Change in Bowel Habits, Chronic diarrhea, Constipation, Difficulty Swallowing, Excessive gas, Gets full quickly at meals, Hemorrhoids, Nausea, Rectal Pain and Vomiting. Male Genitourinary Present- Frequency. Not Present- Blood in Urine, Change in Urinary Stream, Impotence, Nocturia, Painful Urination, Urgency and Urine Leakage. Musculoskeletal Present- Joint Stiffness. Not Present- Back Pain, Joint Pain, Muscle Pain, Muscle Weakness and Swelling of Extremities. Neurological Not Present- Decreased Memory, Fainting, Headaches, Numbness, Seizures, Tingling, Tremor, Trouble walking and Weakness. Psychiatric Not Present- Anxiety, Bipolar, Change in Sleep Pattern, Depression, Fearful and Frequent crying. Hematology Present- Blood Thinners. Not Present- Easy Bruising, Excessive bleeding, Gland problems, HIV and Persistent Infections.  Vitals Sabino Gasser CMA; 05/27/2018 10:36 AM) 05/27/2018 10:35 AM Weight: 178.38 lb Height: 70in Body Surface Area: 1.99 m Body Mass Index: 25.59 kg/m  Temp.: 98.32F(Oral)  Pulse: 88 (Regular)  P.OX: 99% (Room air) BP: 110/72 (Sitting, Left Arm, Standard)      Physical Exam (Princesa Willig A. Yuuki Skeens MD; 05/27/2018 11:33 AM)  General Mental Status-Alert. General Appearance-Consistent with stated age. Hydration-Well hydrated. Voice-Normal.  Head and Neck Head-normocephalic, atraumatic with no lesions or palpable masses. Trachea-midline. Thyroid Gland Characteristics - normal size and consistency.  Chest and Lung Exam Chest and lung exam reveals  -quiet, even and easy respiratory effort with no use of accessory muscles and on auscultation, normal breath sounds, no adventitious sounds and normal vocal resonance. Inspection Chest Wall - Normal. Back - normal.  Cardiovascular Cardiovascular examination reveals -normal heart sounds, regular rate and rhythm with no murmurs and normal pedal pulses  bilaterally.  Abdomen Note: Reducible right inguinal hernia. No evidence of left inguinal hernia. Soft nontender without rebound or guarding.  Musculoskeletal Normal Exam - Left-Upper Extremity Strength Normal and Lower Extremity Strength Normal. Normal Exam - Right-Upper Extremity Strength Normal and Lower Extremity Strength Normal.    Assessment & Plan (Greenleigh Kauth A. Karolyn Messing MD; 05/27/2018 11:34 AM)  RIGHT INGUINAL HERNIA (K40.90) Impression: Discussed laparoscopic and open techniques. Discussed the use of mesh potential long-term consequences of mesh use and chronic pain syndromes. Discuss the pros and cons and risk of mesh use and the risk of not using mesh which includes increased risk of recurrence and recurrent surgery. Discuss recurrence rates of 3% with the use of mesh. Discussed laparoscopic and open techniques. He is opted for open repair his right inguinal hernia with mesh. We discussed using preoperative blocks to reduce postoperative pain.  Current Plans You are being scheduled for surgery- Our schedulers will call you.  You should hear from our office's scheduling department within 5 working days about the location, date, and time of surgery. We try to make accommodations for patient's preferences in scheduling surgery, but sometimes the OR schedule or the surgeon's schedule prevents [place] from making those accommodations.  If you have not heard from our office [phone]) in 5 working days, call the office and ask for your surgeon's nurse.  If you have other questions about your diagnosis, plan, or surgery, call the  office and ask for your surgeon's nurse.  The anatomy & physiology of the abdominal wall and pelvic floor was discussed. The pathophysiology of hernias in the inguinal and pelvic region was discussed. Natural history risks such as progressive enlargement, pain, incarceration, and strangulation was discussed. Contributors to complications such as smoking, obesity, diabetes, prior surgery, etc were discussed.  I feel the risks of no intervention will lead to serious problems that outweigh the operative risks; therefore, I recommended surgery to reduce and repair the hernia. I explained an open approach. I noted usual use of mesh to patch and/or buttress hernia repair  Risks such as bleeding, infection, abscess, need for further treatment, heart attack, death, and other risks were discussed. I noted a good likelihood this will help address the problem. Goals of post-operative recovery were discussed as well. Possibility that this will not correct all symptoms was explained. I stressed the importance of low-impact activity, aggressive pain control, avoiding constipation, & not pushing through pain to minimize risk of post-operative chronic pain or injury. Possibility of reherniation was discussed. We will work to minimize complications.  An educational handout further explaining the pathology & treatment options was given as well. Questions were answered. The patient expresses understanding & wishes to proceed with surgery.  Pt Education - Pamphlet Given - Hernia Surgery: discussed with patient and provided information.

## 2018-05-28 ENCOUNTER — Other Ambulatory Visit: Payer: Self-pay | Admitting: Internal Medicine

## 2018-05-28 ENCOUNTER — Encounter: Payer: Self-pay | Admitting: Internal Medicine

## 2018-05-28 LAB — CUP PACEART REMOTE DEVICE CHECK
Date Time Interrogation Session: 20200316071334
Implantable Pulse Generator Implant Date: 20190506
Pulse Gen Serial Number: 9374188

## 2018-05-28 NOTE — Progress Notes (Signed)
Received ILR alerts for 2 "AF" episodes. Episode from 05/24/18 duration 56min 24sec, episode from 05/27/18 duration 32min 30sec. ECGs routed to Dr. Lovena Le for review.

## 2018-05-29 ENCOUNTER — Telehealth: Payer: Self-pay | Admitting: *Deleted

## 2018-05-29 MED ORDER — APIXABAN 5 MG PO TABS: 5.0000 mg | ORAL_TABLET | Freq: Two times a day (BID) | ORAL | 6 refills | Status: DC

## 2018-05-29 NOTE — Telephone Encounter (Signed)
Informed patient of results and verbal understanding expressed. Pt agreeable to starting Eliquis, stopping ASA. Rx sent to [facility].  Will forward this to [place] to address AFib f/u appt

## 2018-05-29 NOTE — Patient Instructions (Signed)
Pt instructed to start Eliquis 5 mg PO BID.  Pt advised to stop ASA 325 mg.  Will plan on scheduling follow up with Dr. Lovena Le in 3 months.

## 2018-05-29 NOTE — Telephone Encounter (Signed)
-----   Message from Evans Lance, MD sent at 05/29/2018  9:05 AM EDT ----- He needs to start Eliquis 5 mg bid. GT

## 2018-06-04 NOTE — Progress Notes (Signed)
ILR

## 2018-06-27 ENCOUNTER — Ambulatory Visit (INDEPENDENT_AMBULATORY_CARE_PROVIDER_SITE_OTHER): Payer: Medicare Other | Admitting: *Deleted

## 2018-06-27 ENCOUNTER — Ambulatory Visit (HOSPITAL_BASED_OUTPATIENT_CLINIC_OR_DEPARTMENT_OTHER): Admit: 2018-06-27 | Payer: Medicare Other | Admitting: Surgery

## 2018-06-27 ENCOUNTER — Encounter (HOSPITAL_BASED_OUTPATIENT_CLINIC_OR_DEPARTMENT_OTHER): Payer: Self-pay

## 2018-06-27 ENCOUNTER — Other Ambulatory Visit: Payer: Self-pay

## 2018-06-27 DIAGNOSIS — I639 Cerebral infarction, unspecified: Secondary | ICD-10-CM

## 2018-06-27 LAB — CUP PACEART REMOTE DEVICE CHECK
Date Time Interrogation Session: 20200416145058
Implantable Pulse Generator Implant Date: 20190506
Pulse Gen Serial Number: 9374188

## 2018-06-27 SURGERY — REPAIR, HERNIA, INGUINAL, ADULT
Anesthesia: General | Laterality: Right

## 2018-07-03 NOTE — Progress Notes (Signed)
Loop Recorder  

## 2018-07-08 ENCOUNTER — Telehealth: Payer: Self-pay

## 2018-07-08 NOTE — Telephone Encounter (Signed)
Not sure why I'm getting all these patients that should be in the preop pool? I've never seen this patient. Robert Mclaughlin?

## 2018-07-08 NOTE — Telephone Encounter (Signed)
Pt had a cryptogenic stroke in May 2019 and was placed in a stroke study by Dr Leonie Man.  We may want to get his input on holding anticoagulation. I'll forward this to him as well as our pharmacist.   Kerin Ransom PA-C 07/08/2018 4:33 PM

## 2018-07-08 NOTE — Telephone Encounter (Signed)
It appears this was routed to all because the original message was sent to [facility] Preop instead of P [facility] Preop. Anyone who saw this message in error, disregard. Lurena Joiner has routed to the correct pool. Dayna Dunn PA-C

## 2018-07-08 NOTE — Telephone Encounter (Signed)
Patient was enrolled in the BMS stroke study but he has finished it.  He has since been diagnosed with A. fib and is on Eliquis.  Since it has been nearly a year since his stroke he may come off the Eliquis for not greater than 48 hours prior to elective hernia surgery with a small but acceptable periprocedural risk of stroke/TIA if patient is willing.

## 2018-07-08 NOTE — Telephone Encounter (Signed)
Patient with diagnosis of atrial fibrillation on Eliquis for anticoagulation.    Procedure: hernia repair Date of procedure: TBD  CHADS2-VASc score of  4 (, AGE, , stroke/tia x 2, CAD, )  CrCl 66.4 Platelet count 162  Although CHADS2-VASc score is only 4, patient had an embolic stroke just under a year ago.  Would not recommend holding anticoagulation greater than 24-36 hours, but will defer to MD (cardiology and/or neurology) as to the risk/benefit of stopping at this time.

## 2018-07-08 NOTE — Telephone Encounter (Signed)
   [facility] Pre-operative Risk Assessment    Request for surgical clearance:  1. What type of surgery is being performed? Hernia Repair   2. When is this surgery scheduled? TBD  3. What type of clearance is required (medical clearance vs. Pharmacy clearance to hold med vs. Both)? Pharmacy  4. Are there any medications that need to be held prior to surgery and how long? Eliquis  5. Practice name and name of physician performing surgery? [facility] - Dr. Rolena Infante   6. What is your office phone number [phone]    7.   What is your office fax number [phone]  8.   Anesthesia type (None, local, MAC, general) ? General   Jacinta Shoe 07/08/2018, 3:13 PM  _________________________________________________________________   (provider comments below)

## 2018-07-09 NOTE — Telephone Encounter (Signed)
   Primary Cardiologist: Jenkins Rouge, MD  Chart reviewed as part of pre-operative protocol coverage. Patient was contacted 07/09/2018 in reference to pre-operative risk assessment for pending surgery as outlined below. Surgeon requested recommendations for holding anticoagulation for surgery.  As per our pharmacy staff: Patient with diagnosis of atrial fibrillation on Eliquis for anticoagulation.    Procedure: hernia repair Date of procedure: TBD  CHADS2-VASc score of  4 (, AGE, , stroke/tia x 2, CAD, )  CrCl 66.4 Platelet count 162  Although CHADS2-VASc score is only 4, patient had an embolic stroke just under a year ago.  Would not recommend holding anticoagulation greater than 24-36 hours, but will defer to MD (cardiology and/or neurology) as to the risk/benefit of stopping at this time.  As per Dr. Leonie Man (neurology):  Patient was enrolled in the BMS stroke study but he has finished it.  He has since been diagnosed with A. fib and is on Eliquis.  Since it has been nearly a year since his stroke he may come off the Eliquis for not greater than 48 hours prior to elective hernia surgery with a small but acceptable periprocedural risk of stroke/TIA if patient is willing.   I spoke with the patient about the above recommendations and he understands the risk of interrupting anticoagulation.   These recommendations are good for two months and are based on the patient's current clinical picture.  I will route this recommendation to the requesting party via Epic fax function and remove from pre-op pool.  Please call with questions.  Tami Lin Tomy Khim, PA 07/09/2018, 8:43 AM

## 2018-07-11 ENCOUNTER — Telehealth: Payer: Self-pay | Admitting: Physician Assistant

## 2018-07-11 NOTE — Telephone Encounter (Signed)
error 

## 2018-07-18 ENCOUNTER — Ambulatory Visit: Payer: Self-pay | Admitting: Surgery

## 2018-07-29 ENCOUNTER — Ambulatory Visit (INDEPENDENT_AMBULATORY_CARE_PROVIDER_SITE_OTHER): Payer: Medicare Other | Admitting: *Deleted

## 2018-07-29 ENCOUNTER — Other Ambulatory Visit: Payer: Self-pay

## 2018-07-29 DIAGNOSIS — I639 Cerebral infarction, unspecified: Secondary | ICD-10-CM | POA: Diagnosis not present

## 2018-07-30 LAB — CUP PACEART REMOTE DEVICE CHECK
Date Time Interrogation Session: 20200519073859
Implantable Pulse Generator Implant Date: 20190506
Pulse Gen Serial Number: 9374188

## 2018-08-07 NOTE — Progress Notes (Signed)
Loop Recorder  

## 2018-08-09 ENCOUNTER — Other Ambulatory Visit: Payer: Self-pay

## 2018-08-09 ENCOUNTER — Encounter (HOSPITAL_BASED_OUTPATIENT_CLINIC_OR_DEPARTMENT_OTHER): Payer: Self-pay | Admitting: *Deleted

## 2018-08-12 ENCOUNTER — Other Ambulatory Visit (HOSPITAL_COMMUNITY)
Admission: RE | Admit: 2018-08-12 | Discharge: 2018-08-12 | Disposition: A | Discharge status: Home / Self Care | Payer: Medicare Other | Source: Ambulatory Visit | Attending: Surgery | Admitting: Surgery

## 2018-08-12 ENCOUNTER — Encounter (HOSPITAL_BASED_OUTPATIENT_CLINIC_OR_DEPARTMENT_OTHER)
Admission: RE | Admit: 2018-08-12 | Discharge: 2018-08-12 | Disposition: A | Discharge status: Home / Self Care | Payer: Medicare Other | Source: Ambulatory Visit | Attending: Surgery | Admitting: Surgery

## 2018-08-12 ENCOUNTER — Other Ambulatory Visit: Payer: Self-pay

## 2018-08-12 DIAGNOSIS — Z1159 Encounter for screening for other viral diseases: Secondary | ICD-10-CM | POA: Diagnosis not present

## 2018-08-12 DIAGNOSIS — Z01812 Encounter for preprocedural laboratory examination: Secondary | ICD-10-CM | POA: Insufficient documentation

## 2018-08-12 LAB — CBC WITH DIFFERENTIAL/PLATELET
Abs Immature Granulocytes: 0.02 10*3/uL (ref 0.00–0.07)
Basophils Absolute: 0.1 10*3/uL (ref 0.0–0.1)
Basophils Relative: 1 %
Eosinophils Absolute: 0.2 10*3/uL (ref 0.0–0.5)
Eosinophils Relative: 2 %
HCT: 43.9 % (ref 39.0–52.0)
Hemoglobin: 14.8 g/dL (ref 13.0–17.0)
Immature Granulocytes: 0 %
Lymphocytes Relative: 20 %
Lymphs Abs: 1.6 10*3/uL (ref 0.7–4.0)
MCH: 30.3 pg (ref 26.0–34.0)
MCHC: 33.7 g/dL (ref 30.0–36.0)
MCV: 90 fL (ref 80.0–100.0)
Monocytes Absolute: 0.7 10*3/uL (ref 0.1–1.0)
Monocytes Relative: 9 %
Neutro Abs: 5.5 10*3/uL (ref 1.7–7.7)
Neutrophils Relative %: 68 %
Platelets: 183 10*3/uL (ref 150–400)
RBC: 4.88 MIL/uL (ref 4.22–5.81)
RDW: 12.8 % (ref 11.5–15.5)
WBC: 8.1 10*3/uL (ref 4.0–10.5)
nRBC: 0 % (ref 0.0–0.2)

## 2018-08-12 LAB — COMPREHENSIVE METABOLIC PANEL
ALT: 24 U/L (ref 0–44)
AST: 26 U/L (ref 15–41)
Albumin: 4.2 g/dL (ref 3.5–5.0)
Alkaline Phosphatase: 43 U/L (ref 38–126)
Anion gap: 6 (ref 5–15)
BUN: 18 mg/dL (ref 8–23)
CO2: 27 mmol/L (ref 22–32)
Calcium: 9.3 mg/dL (ref 8.9–10.3)
Chloride: 104 mmol/L (ref 98–111)
Creatinine, Ser: 1.1 mg/dL (ref 0.61–1.24)
GFR calc Af Amer: >60 mL/min (ref >60)
GFR calc non Af Amer: >60 mL/min (ref >60)
Glucose, Bld: 94 mg/dL (ref 70–99)
Potassium: 4.8 mmol/L (ref 3.5–5.1)
Sodium: 137 mmol/L (ref 135–145)
Total Bilirubin: 0.9 mg/dL (ref 0.3–1.2)
Total Protein: 7.2 g/dL (ref 6.5–8.1)

## 2018-08-14 LAB — NOVEL CORONAVIRUS, NAA (HOSP ORDER, SEND-OUT TO REF LAB; TAT 18-24 HRS): SARS-CoV-2, NAA: NOT DETECTED

## 2018-08-15 ENCOUNTER — Ambulatory Visit (HOSPITAL_BASED_OUTPATIENT_CLINIC_OR_DEPARTMENT_OTHER): Payer: Medicare Other | Admitting: Anesthesiology

## 2018-08-15 ENCOUNTER — Encounter (HOSPITAL_BASED_OUTPATIENT_CLINIC_OR_DEPARTMENT_OTHER): Payer: Self-pay | Admitting: Anesthesiology

## 2018-08-15 ENCOUNTER — Encounter (HOSPITAL_BASED_OUTPATIENT_CLINIC_OR_DEPARTMENT_OTHER)
Admission: RE | Disposition: A | Discharge status: Home / Self Care | Payer: Self-pay | Source: Home / Self Care | Attending: Surgery

## 2018-08-15 ENCOUNTER — Ambulatory Visit (HOSPITAL_BASED_OUTPATIENT_CLINIC_OR_DEPARTMENT_OTHER)
Admission: RE | Admit: 2018-08-15 | Discharge: 2018-08-15 | Disposition: A | Discharge status: Home / Self Care | Payer: Medicare Other | Attending: Surgery | Admitting: Surgery

## 2018-08-15 DIAGNOSIS — Z8673 Personal history of transient ischemic attack (TIA), and cerebral infarction without residual deficits: Secondary | ICD-10-CM | POA: Diagnosis not present

## 2018-08-15 DIAGNOSIS — Z79899 Other long term (current) drug therapy: Secondary | ICD-10-CM | POA: Insufficient documentation

## 2018-08-15 DIAGNOSIS — R51 Headache: Secondary | ICD-10-CM | POA: Diagnosis not present

## 2018-08-15 DIAGNOSIS — M1990 Unspecified osteoarthritis, unspecified site: Secondary | ICD-10-CM | POA: Insufficient documentation

## 2018-08-15 DIAGNOSIS — K4090 Unilateral inguinal hernia, without obstruction or gangrene, not specified as recurrent: Secondary | ICD-10-CM | POA: Diagnosis not present

## 2018-08-15 DIAGNOSIS — I10 Essential (primary) hypertension: Secondary | ICD-10-CM | POA: Diagnosis not present

## 2018-08-15 DIAGNOSIS — Z87891 Personal history of nicotine dependence: Secondary | ICD-10-CM | POA: Diagnosis not present

## 2018-08-15 DIAGNOSIS — Z86010 Personal history of colonic polyps: Secondary | ICD-10-CM | POA: Diagnosis not present

## 2018-08-15 DIAGNOSIS — I2510 Atherosclerotic heart disease of native coronary artery without angina pectoris: Secondary | ICD-10-CM | POA: Diagnosis not present

## 2018-08-15 DIAGNOSIS — G464 Cerebellar stroke syndrome: Secondary | ICD-10-CM | POA: Diagnosis not present

## 2018-08-15 DIAGNOSIS — Z791 Long term (current) use of non-steroidal anti-inflammatories (NSAID): Secondary | ICD-10-CM | POA: Insufficient documentation

## 2018-08-15 DIAGNOSIS — N400 Enlarged prostate without lower urinary tract symptoms: Secondary | ICD-10-CM | POA: Diagnosis not present

## 2018-08-15 HISTORY — DX: Unspecified atrial fibrillation: I48.91

## 2018-08-15 HISTORY — PX: INGUINAL HERNIA REPAIR: SHX194

## 2018-08-15 HISTORY — DX: Urinary tract infection, site not specified: N39.0

## 2018-08-15 SURGERY — REPAIR, HERNIA, INGUINAL, ADULT
Anesthesia: General | Site: Groin | Laterality: Right

## 2018-08-15 MED ORDER — GABAPENTIN 300 MG PO CAPS
ORAL_CAPSULE | ORAL | Status: AC
  Filled 2018-08-15: qty 1

## 2018-08-15 MED ORDER — DEXAMETHASONE SODIUM PHOSPHATE 10 MG/ML IJ SOLN
INTRAMUSCULAR | Status: AC
  Filled 2018-08-15: qty 1

## 2018-08-15 MED ORDER — PROMETHAZINE HCL 25 MG/ML IJ SOLN: 6.2500 mg | INTRAMUSCULAR | Status: DC | PRN

## 2018-08-15 MED ORDER — CHLORHEXIDINE GLUCONATE CLOTH 2 % EX PADS: 6.0000 | MEDICATED_PAD | Freq: Once | CUTANEOUS | Status: DC

## 2018-08-15 MED ORDER — LIDOCAINE 2% (20 MG/ML) 5 ML SYRINGE
INTRAMUSCULAR | Status: DC | PRN
  Administered 2018-08-15: 80 mg via INTRAVENOUS

## 2018-08-15 MED ORDER — TRAMADOL HCL 50 MG PO TABS: 50.0000 mg | ORAL_TABLET | Freq: Four times a day (QID) | ORAL | 0 refills | Status: DC | PRN

## 2018-08-15 MED ORDER — LACTATED RINGERS IV SOLN
INTRAVENOUS | Status: DC
  Administered 2018-08-15 (×2): via INTRAVENOUS

## 2018-08-15 MED ORDER — EPHEDRINE SULFATE-NACL 50-0.9 MG/10ML-% IV SOSY
PREFILLED_SYRINGE | INTRAVENOUS | Status: DC | PRN
  Administered 2018-08-15 (×3): 10 mg via INTRAVENOUS

## 2018-08-15 MED ORDER — ONDANSETRON HCL 4 MG/2ML IJ SOLN
INTRAMUSCULAR | Status: AC
  Filled 2018-08-15: qty 2

## 2018-08-15 MED ORDER — CELECOXIB 200 MG PO CAPS
ORAL_CAPSULE | ORAL | Status: AC
  Filled 2018-08-15: qty 1

## 2018-08-15 MED ORDER — MIDAZOLAM HCL 2 MG/2ML IJ SOLN
INTRAMUSCULAR | Status: AC
  Filled 2018-08-15: qty 2

## 2018-08-15 MED ORDER — ACETAMINOPHEN 500 MG PO TABS
ORAL_TABLET | ORAL | Status: AC
  Filled 2018-08-15: qty 2

## 2018-08-15 MED ORDER — EPHEDRINE 5 MG/ML INJ
INTRAVENOUS | Status: AC
  Filled 2018-08-15: qty 10

## 2018-08-15 MED ORDER — PROPOFOL 10 MG/ML IV BOLUS
INTRAVENOUS | Status: DC | PRN
  Administered 2018-08-15: 150 mg via INTRAVENOUS
  Administered 2018-08-15: 30 mg via INTRAVENOUS

## 2018-08-15 MED ORDER — FENTANYL CITRATE (PF) 100 MCG/2ML IJ SOLN
50.0000 ug | INTRAMUSCULAR | Status: DC | PRN
  Administered 2018-08-15: 100 ug via INTRAVENOUS

## 2018-08-15 MED ORDER — DEXTROSE 5 % IV SOLN: 3.0000 g | INTRAVENOUS | Status: DC

## 2018-08-15 MED ORDER — CEFAZOLIN SODIUM-DEXTROSE 2-4 GM/100ML-% IV SOLN
INTRAVENOUS | Status: AC
  Filled 2018-08-15: qty 100

## 2018-08-15 MED ORDER — LIDOCAINE 2% (20 MG/ML) 5 ML SYRINGE
INTRAMUSCULAR | Status: AC
  Filled 2018-08-15: qty 5

## 2018-08-15 MED ORDER — DEXAMETHASONE SODIUM PHOSPHATE 4 MG/ML IJ SOLN
INTRAMUSCULAR | Status: DC | PRN
  Administered 2018-08-15: 10 mg via INTRAVENOUS

## 2018-08-15 MED ORDER — BUPIVACAINE-EPINEPHRINE 0.25% -1:200000 IJ SOLN
INTRAMUSCULAR | Status: DC | PRN
  Administered 2018-08-15: 20 mL

## 2018-08-15 MED ORDER — MIDAZOLAM HCL 2 MG/2ML IJ SOLN: 1.0000 mg | INTRAMUSCULAR | Status: DC | PRN

## 2018-08-15 MED ORDER — ACETAMINOPHEN 500 MG PO TABS
1000.0000 mg | ORAL_TABLET | ORAL | Status: AC
  Administered 2018-08-15: 1000 mg via ORAL

## 2018-08-15 MED ORDER — FENTANYL CITRATE (PF) 100 MCG/2ML IJ SOLN
INTRAMUSCULAR | Status: AC
  Filled 2018-08-15: qty 2

## 2018-08-15 MED ORDER — CEFAZOLIN SODIUM-DEXTROSE 2-4 GM/100ML-% IV SOLN
2.0000 g | INTRAVENOUS | Status: AC
  Administered 2018-08-15: 2 g via INTRAVENOUS

## 2018-08-15 MED ORDER — HYDROMORPHONE HCL 1 MG/ML IJ SOLN
INTRAMUSCULAR | Status: AC
  Filled 2018-08-15: qty 0.5

## 2018-08-15 MED ORDER — GABAPENTIN 300 MG PO CAPS
300.0000 mg | ORAL_CAPSULE | ORAL | Status: AC
  Administered 2018-08-15: 300 mg via ORAL

## 2018-08-15 MED ORDER — SUCCINYLCHOLINE CHLORIDE 20 MG/ML IJ SOLN
INTRAMUSCULAR | Status: DC | PRN
  Administered 2018-08-15: 120 mg via INTRAVENOUS

## 2018-08-15 MED ORDER — ONDANSETRON HCL 4 MG/2ML IJ SOLN
INTRAMUSCULAR | Status: DC | PRN
  Administered 2018-08-15: 4 mg via INTRAVENOUS

## 2018-08-15 MED ORDER — HYDROMORPHONE HCL 1 MG/ML IJ SOLN
0.2500 mg | INTRAMUSCULAR | Status: DC | PRN
  Administered 2018-08-15: 0.5 mg via INTRAVENOUS

## 2018-08-15 MED ORDER — CELECOXIB 200 MG PO CAPS: 200.0000 mg | ORAL_CAPSULE | ORAL | Status: DC

## 2018-08-15 SURGICAL SUPPLY — 48 items
ADH SKN CLS APL DERMABOND .7 (GAUZE/BANDAGES/DRESSINGS) ×1
APL PRP STRL LF DISP 70% ISPRP (MISCELLANEOUS) ×1
BLADE CLIPPER SURG (BLADE) IMPLANT
BLADE SURG 15 STRL LF DISP TIS (BLADE) ×1 IMPLANT
BLADE SURG 15 STRL SS (BLADE) ×3
CANISTER SUCT 1200ML W/VALVE (MISCELLANEOUS) ×2 IMPLANT
CHLORAPREP W/TINT 26 (MISCELLANEOUS) ×3 IMPLANT
COVER BACK TABLE REUSABLE LG (DRAPES) ×3 IMPLANT
COVER MAYO STAND REUSABLE (DRAPES) ×3 IMPLANT
COVER WAND RF STERILE (DRAPES) IMPLANT
DECANTER SPIKE VIAL GLASS SM (MISCELLANEOUS) IMPLANT
DERMABOND ADVANCED (GAUZE/BANDAGES/DRESSINGS) ×2
DERMABOND ADVANCED .7 DNX12 (GAUZE/BANDAGES/DRESSINGS) ×1 IMPLANT
DRAIN PENROSE 1/2X12 LTX STRL (WOUND CARE) ×3 IMPLANT
DRAPE LAPAROTOMY TRNSV 102X78 (DRAPE) ×3 IMPLANT
DRAPE UTILITY XL STRL (DRAPES) ×3 IMPLANT
ELECT COATED BLADE 2.86 ST (ELECTRODE) ×3 IMPLANT
ELECT REM PT RETURN 9FT ADLT (ELECTROSURGICAL) ×3
ELECTRODE REM PT RTRN 9FT ADLT (ELECTROSURGICAL) ×1 IMPLANT
GAUZE 4X4 16PLY RFD (DISPOSABLE) IMPLANT
GAUZE SPONGE 4X4 12PLY STRL LF (GAUZE/BANDAGES/DRESSINGS) IMPLANT
GLOVE BIOGEL PI IND STRL 8 (GLOVE) ×1 IMPLANT
GLOVE BIOGEL PI INDICATOR 8 (GLOVE) ×2
GLOVE ECLIPSE 8.0 STRL XLNG CF (GLOVE) ×3 IMPLANT
GOWN STRL REUS W/ TWL LRG LVL3 (GOWN DISPOSABLE) ×2 IMPLANT
GOWN STRL REUS W/TWL LRG LVL3 (GOWN DISPOSABLE) ×6
MESH HERNIA SYS ULTRAPRO LRG (Mesh General) ×2 IMPLANT
NDL HYPO 25X1 1.5 SAFETY (NEEDLE) ×1 IMPLANT
NEEDLE HYPO 25X1 1.5 SAFETY (NEEDLE) ×3 IMPLANT
NS IRRIG 1000ML POUR BTL (IV SOLUTION) ×2 IMPLANT
PACK BASIN DAY SURGERY FS (CUSTOM PROCEDURE TRAY) ×3 IMPLANT
PENCIL BUTTON HOLSTER BLD 10FT (ELECTRODE) ×3 IMPLANT
SLEEVE SCD COMPRESS KNEE MED (MISCELLANEOUS) ×3 IMPLANT
SPONGE LAP 4X18 RFD (DISPOSABLE) ×3 IMPLANT
SUT MON AB 4-0 PC3 18 (SUTURE) ×3 IMPLANT
SUT NOVA 0 T19/GS 22DT (SUTURE) ×6 IMPLANT
SUT VIC AB 2-0 SH 27 (SUTURE) ×3
SUT VIC AB 2-0 SH 27XBRD (SUTURE) ×1 IMPLANT
SUT VIC AB 3-0 54X BRD REEL (SUTURE) IMPLANT
SUT VIC AB 3-0 BRD 54 (SUTURE)
SUT VICRYL 3-0 CR8 SH (SUTURE) ×3 IMPLANT
SUT VICRYL AB 2 0 TIE (SUTURE) IMPLANT
SUT VICRYL AB 2 0 TIES (SUTURE)
SYR CONTROL 10ML LL (SYRINGE) ×3 IMPLANT
TOWEL GREEN STERILE FF (TOWEL DISPOSABLE) ×3 IMPLANT
TUBE CONNECTING 20'X1/4 (TUBING) ×1
TUBE CONNECTING 20X1/4 (TUBING) ×1 IMPLANT
YANKAUER SUCT BULB TIP NO VENT (SUCTIONS) ×2 IMPLANT

## 2018-08-15 NOTE — Anesthesia Preprocedure Evaluation (Signed)
Anesthesia Evaluation  Patient identified by MRN, date of birth, ID band Patient awake    Reviewed: Allergy & Precautions, NPO status , Patient's Chart, lab work & pertinent test results  Airway Mallampati: I       Dental no notable dental hx. (+) Teeth Intact   Pulmonary former smoker,    Pulmonary exam normal breath sounds clear to auscultation       Cardiovascular Normal cardiovascular exam Rhythm:Regular Rate:Normal     Neuro/Psych  Headaches, negative psych ROS   GI/Hepatic negative GI ROS, Neg liver ROS,   Endo/Other  negative endocrine ROS  Renal/GU negative Renal ROS  negative genitourinary   Musculoskeletal  (+) Arthritis , Osteoarthritis,    Abdominal Normal abdominal exam  (+)   Peds negative pediatric ROS (+)  Hematology negative hematology ROS (+)   Anesthesia Other Findings ECHO LIMITED WO IMAGE ENHANCING AGENT W COLOR & SPECT  Order# [id number]  Reading physician: Thayer Headings, MD Ordering physician: Sherren Mocha, MD Study date: 04/12/16 Study Result   Result status: Edited Result - FINAL                             *[facility]*                         [address], [place] [address]                            [phone]  ------------------------------------------------------------------- Transthoracic Echocardiography  (Report amended )  Patient:    Robert Mclaughlin, Robert Mclaughlin MR #:       [id number] Study Date: 04/12/2016 Gender:     M Age:        73 Height:     180.3 cm Weight:     84 kg BSA:        2.06 m^2 Pt. Status: Room:       [facility]  ORDERING     Sherren Mocha, MD  PERFORMING   [facility], Inpatient  cc:  ------------------------------------------------------------------- LV EF: 60% -   65%     Reproductive/Obstetrics                              Anesthesia Physical  Anesthesia Plan  ASA: II  Anesthesia Plan: General   Post-op Pain Management:    Induction:   PONV Risk Score and Plan: 2 and Ondansetron, Treatment may vary due to age or medical condition and Midazolam  Airway Management Planned: LMA  Additional Equipment:   Intra-op Plan:   Post-operative Plan: Extubation in OR  Informed Consent: I have reviewed the patients History and Physical, chart, labs and discussed the procedure including the risks, benefits and alternatives for the proposed anesthesia with the patient or authorized representative who has indicated his/her understanding and acceptance.     Dental advisory given  Plan Discussed with: CRNA and Surgeon  Anesthesia Plan Comments:         Anesthesia Quick Evaluation

## 2018-08-15 NOTE — Discharge Instructions (Signed)
[facility] ° °UMBILICAL OR INGUINAL HERNIA REPAIR: POST OP INSTRUCTIONS ° °Always review your discharge instruction sheet given to you by the facility where your surgery was performed. °IF YOU HAVE DISABILITY OR FAMILY LEAVE FORMS, YOU MUST BRING THEM TO THE OFFICE FOR PROCESSING.   °DO NOT GIVE THEM TO YOUR DOCTOR. ° °1. A  prescription for pain medication may be given to you upon discharge.  Take your pain medication as prescribed, if needed.  If narcotic pain medicine is not needed, then you may take acetaminophen (Tylenol) or ibuprofen (Advil) as needed. °2. Take your usually prescribed medications unless otherwise directed. °If you need a refill on your pain medication, please contact your pharmacy.  They will contact our office to request authorization. Prescriptions will not be filled after 5 pm or on week-ends. °3. You should follow a light diet the first 24 hours after arrival home, such as soup and crackers, etc.  Be sure to include lots of fluids daily.  Resume your normal diet the day after surgery. °4.Most patients will experience some swelling and bruising around the umbilicus or in the groin and scrotum.  Ice packs and reclining will help.  Swelling and bruising can take several days to resolve.  °6. It is common to experience some constipation if taking pain medication after surgery.  Increasing fluid intake and taking a stool softener (such as Colace) will usually help or prevent this problem from occurring.  A mild laxative (Milk of Magnesia or Miralax) should be taken according to package directions if there are no bowel movements after 48 hours. °7. Unless discharge instructions indicate otherwise, you may remove your bandages 24-48 hours after surgery, and you may shower at that time.  You may have steri-strips (small skin tapes) in place directly over the incision.  These strips should be left on the skin for 7-10 days.  If your surgeon used skin glue on the  incision, you may shower in 24 hours.  The glue will flake off over the next 2-3 weeks.  Any sutures or staples will be removed at the office during your follow-up visit. °8. ACTIVITIES:  You may resume regular (light) daily activities beginning the next day--such as daily self-care, walking, climbing stairs--gradually increasing activities as tolerated.  You may have sexual intercourse when it is comfortable.  Refrain from any heavy lifting or straining until approved by your doctor. ° °a.You may drive when you are no longer taking prescription pain medication, you can comfortably wear a seatbelt, and you can safely maneuver your car and apply brakes. °b.RETURN TO WORK:   °_____________________________________________ ° °9.You should see your doctor in the office for a follow-up appointment approximately 2-3 weeks after your surgery.  Make sure that you call for this appointment within a day or two after you arrive home to insure a convenient appointment time. °10.OTHER INSTRUCTIONS: _________________________ °   _____________________________________ ° °WHEN TO CALL YOUR DOCTOR: °1. Fever over 101.0 °2. Inability to urinate °3. Nausea and/or vomiting °4. Extreme swelling or bruising °5. Continued bleeding from incision. °6. Increased pain, redness, or drainage from the incision ° °The clinic staff is available to answer your questions during regular business hours.  Please don’t hesitate to call and ask to speak to one of the nurses for clinical concerns.  If you have a medical emergency, go to the nearest emergency room or call 911.  A surgeon from [facility] is always on call at the hospital ° ° °  [address] [place], [place], [place]  [address] ?  [address], [place], [place]   [address] [phone] ? [phone] ? FAX [phone] Web site: [email or web address]        Wait 48 hours before restarting Eliquis     Post Anesthesia Home Care  Instructions  Activity: Get plenty of rest for the remainder of the day. A responsible individual must stay with you for 24 hours following the procedure.  For the next 24 hours, DO NOT: -Drive a car -Paediatric nurse -Drink alcoholic beverages -Take any medication unless instructed by your physician -Make any legal decisions or sign important papers.  Meals: Start with liquid foods such as gelatin or soup. Progress to regular foods as tolerated. Avoid greasy, spicy, heavy foods. If nausea and/or vomiting occur, drink only clear liquids until the nausea and/or vomiting subsides. Call your physician if vomiting continues.  Special Instructions/Symptoms: Your throat may feel dry or sore from the anesthesia or the breathing tube placed in your throat during surgery. If this causes discomfort, gargle with warm salt water. The discomfort should disappear within 24 hours.  If you had a scopolamine patch placed behind your ear for the management of post- operative nausea and/or vomiting:  1. The medication in the patch is effective for 72 hours, after which it should be removed.  Wrap patch in a tissue and discard in the trash. Wash hands thoroughly with soap and water. 2. You may remove the patch earlier than 72 hours if you experience unpleasant side effects which may include dry mouth, dizziness or visual disturbances. 3. Avoid touching the patch. Wash your hands with soap and water after contact with the patch.

## 2018-08-15 NOTE — Op Note (Signed)
Robert Mclaughlin 12/06/1945 [id number] 08/15/2018  Preoperative diagnosis: Right inguinal hernia reducible  Postoperative diagnosis: Same  Procedure: Repair of right inguinal hernia with ultra Pro hernia system  Surgeon: Erroll Luna, MD, FACS  Anesthesia:GA combined with regional for post-op pain  Clinical History and Indications: Patient presents for repair of symptomatic right inguinal hernia.  Is causing pain he desired repair.  We discussed surgical options as well as nonoperative options.  We discussed long-term expectations and potential complications of surgery repair.  The use of mesh and chronic pain were discussed.  Injury to neighboring structures and other complications discussed.  Laparoscopic versus open repairs discussed.  He opted for open repair of his right inguinal hernia with mesh.The risk of hernia repair include bleeding,  Infection,   Recurrence of the hernia,  Mesh use, chronic pain,  Organ injury,  Bowel injury,  Bladder injury,   nerve injury with numbness around the incision,  Death,  and worsening of preexisting  medical problems.  The alternatives to surgery have been discussed as well..  Long term expectations of both operative and non operative treatments have been discussed.   The patient agrees to proceed.  Description of Procedure:The patient was seen in the holding area and the plans for the procedure as noted above confirmed with the patient. We reviewed again the risks and complications and the patient had no further questions. I then marked the right  as the operative side. This was confirmed with the patient. He wishes to proceed.  The patient was taken to the operating room and after satisfactory general anesthesia was obtained the right  inguinal area was prepped and draped as a sterile field. A time out was done.  I used an equal mixture of 0.25% plain Marcaine with epinephrine for local anesthesia and to help with postoperative pain management. The  area of the incision was infiltrated first as well as a field block going medially and inferiorly. I also injected some below the external oblique aponeurosis at the level of the anterior superior iliac spine.  An oblique incision was made and deepened to the external oblique aponeurosis. Bleeders were either cauterized or tied with 3-0 Vicryl. The external oblique aponeurosis was opened in the line of its fibers and elevated off of the underlying tissue. The ilioinguinal nerve was noted and protected.  The spermatic cord was then dissected up off of the inguinal floor and surrounded with a drain.  Indirect hernia sac dissected off of cord structures.  No evidence of direct hernia.  This was reduced back into the preperitoneal space.  A large ultra pro hernia system was used with the inner leaflet placed into the preperitoneal space and the onlay placed on the floor.  A slit was cut for the cord structures. . It was anchored at the pubic tubercle and a running 2-0 Novafil used to suture it to the edge of the inferior shelving edge of the external oblique. It was split laterally to go around the cord and then laid gently over the internal oblique medially. Several more sutures of 2-0 Novafil were used to anchor the mesh to the internal oblique. The tails of the mesh were crossed lateral to the cord structures and deep ring and tacked together.  This appeared to produce a nice coverage and repair with no tension. There was adequate space for the cord structures to exit through the mesh and deep ring.  The ilioinguinal nerve was tethered under the mesh and therefore was divided as  it exited the muscle to prevent entrapment of postoperative pain.  I checked to make sure everything was dry. Additional local had been infiltrated as I was working to be sure we had complete anesthesia of the entire operative field.  The incision was then closed with a running 3-0 Vicryl on the external oblique, closing it over  the repair. Scarpa's fascia was closed with a running 3-0 Vicryl and the skin with a running 4-0 Monocryl subcuticular and Dermabond on the skin.  The patient tolerated the procedure well. There were no operative complications. There was minimal blood loss. All counts were correct.  Erroll Luna, MD, FACS 08/15/2018 10:10 AM

## 2018-08-15 NOTE — Anesthesia Postprocedure Evaluation (Signed)
Anesthesia Post Note  Patient: Robert Mclaughlin  Procedure(s) Performed: RIGHT INGUINAL HERNIA REPAIR WITH MESH (Right Groin)     Patient location during evaluation: PACU Anesthesia Type: General Level of consciousness: awake and alert Pain management: pain level controlled Vital Signs Assessment: post-procedure vital signs reviewed and stable Respiratory status: spontaneous breathing, nonlabored ventilation and respiratory function stable Cardiovascular status: blood pressure returned to baseline and stable Postop Assessment: no apparent nausea or vomiting Anesthetic complications: no    Last Vitals:  Vitals:   08/15/18 1045 08/15/18 1100  BP:  132/86  Pulse: 92 86  Resp: 13 16  Temp:  36.6 C  SpO2: 94% 96%    Last Pain:  Vitals:   08/15/18 1045  TempSrc:   PainSc: 2                  Lynda Rainwater

## 2018-08-15 NOTE — Anesthesia Procedure Notes (Signed)
Procedure Name: Intubation Date/Time: 08/15/2018 8:48 AM Performed by: Lieutenant Diego, CRNA Pre-anesthesia Checklist: Patient identified, Emergency Drugs available, Suction available and Patient being monitored Patient Re-evaluated:Patient Re-evaluated prior to induction Oxygen Delivery Method: Circle system utilized Preoxygenation: Pre-oxygenation with 100% oxygen Induction Type: IV induction Ventilation: Mask ventilation without difficulty Laryngoscope Size: Miller and 2 Grade View: Grade II Tube type: Oral Tube size: 8.0 mm Number of attempts: 2 Airway Equipment and Method: Stylet and Oral airway Placement Confirmation: ETT inserted through vocal cords under direct vision,  positive ETCO2 and breath sounds checked- equal and bilateral Secured at: 23 cm Tube secured with: Tape Dental Injury: Teeth and Oropharynx as per pre-operative assessment

## 2018-08-15 NOTE — H&P (Signed)
Robert Mclaughlin Location: [facility] Patient #: [id number] DOB: 1945/11/11 Married / Language: English / Race: White Male  History of Present Illness  Patient words: Patient presents with a chief complaint of bulge in his right groin. He's had it for 2 weeks. He noticed it while showering. He saw causing any pain and does not seem to get worse with coughing or straining. He does have some mild indigestion as his other complaint is intermittent without nausea vomiting or change in bowel or bladder function. There's been no redness or tenderness in his right groin.  The patient is a 73 year old male.   Past Surgical History  Colon Polyp Removal - Colonoscopy Knee Surgery Left. Oral Surgery  Diagnostic Studies History Colonoscopy within last year  Allergies  No Known Drug Allergies [05/27/2018]: Allergies Reconciled  Medication History  GaviLyte-N with Flavor Pack (420GM For Solution, Oral) Active. Tamsulosin HCl (0.4MG  Capsule, Oral) Active. Saw Palmetto (80MG  Capsule, Oral) Active. Voltaren (1% Gel, Transdermal) Active. Medications Reconciled  Other Problems ( Cerebrovascular Accident Enlarged Prostate Hemorrhoids Inguinal Hernia     Review of Systems  Skin Present- Dryness. Not Present- Change in Wart/Mole, Hives, Jaundice, New Lesions, Non-Healing Wounds, Rash and Ulcer. HEENT Present- Ringing in the Ears and Wears glasses/contact lenses. Not Present- Earache, Hearing Loss, Hoarseness, Nose Bleed, Oral Ulcers, Seasonal Allergies, Sinus Pain, Sore Throat, Visual Disturbances and Yellow Eyes. Respiratory Present- Snoring. Not Present- Bloody sputum, Chronic Cough, Difficulty Breathing and Wheezing. Breast Not Present- Breast Mass, Breast Pain, Nipple Discharge and Skin Changes. Cardiovascular Not Present- Chest Pain, Difficulty Breathing Lying Down, Leg Cramps, Palpitations, Rapid Heart Rate, Shortness of Breath and Swelling of  Extremities. Gastrointestinal Present- Indigestion. Not Present- Abdominal Pain, Bloating, Bloody Stool, Change in Bowel Habits, Chronic diarrhea, Constipation, Difficulty Swallowing, Excessive gas, Gets full quickly at meals, Hemorrhoids, Nausea, Rectal Pain and Vomiting. Male Genitourinary Present- Frequency. Not Present- Blood in Urine, Change in Urinary Stream, Impotence, Nocturia, Painful Urination, Urgency and Urine Leakage. Musculoskeletal Present- Joint Stiffness. Not Present- Back Pain, Joint Pain, Muscle Pain, Muscle Weakness and Swelling of Extremities. Neurological Not Present- Decreased Memory, Fainting, Headaches, Numbness, Seizures, Tingling, Tremor, Trouble walking and Weakness. Psychiatric Not Present- Anxiety, Bipolar, Change in Sleep Pattern, Depression, Fearful and Frequent crying. Hematology Present- Blood Thinners. Not Present- Easy Bruising, Excessive bleeding, Gland problems, HIV and Persistent Infections.  Vitals 05/27/2018 10:35 AM Weight: 178.38 lb Height: 70in Body Surface Area: 1.99 m Body Mass Index: 25.59 kg/m  Temp.: 98.7F(Oral)  Pulse: 88 (Regular)  P.OX: 99% (Room air) BP: 110/72 (Sitting, Left Arm, Standard)      Physical Exam  General Mental Status-Alert. General Appearance-Consistent with stated age. Hydration-Well hydrated. Voice-Normal.  Head and Neck Head-normocephalic, atraumatic with no lesions or palpable masses. Trachea-midline. Thyroid Gland Characteristics - normal size and consistency.  Chest and Lung Exam Chest and lung exam reveals -quiet, even and easy respiratory effort with no use of accessory muscles and on auscultation, normal breath sounds, no adventitious sounds and normal vocal resonance. Inspection Chest Wall - Normal. Back - normal.  Cardiovascular Cardiovascular examination reveals -normal heart sounds, regular rate and rhythm with no murmurs and normal pedal pulses  bilaterally.  Abdomen Note: Reducible right inguinal hernia. No evidence of left inguinal hernia. Soft nontender without rebound or guarding.  Musculoskeletal Normal Exam - Left-Upper Extremity Strength Normal and Lower Extremity Strength Normal. Normal Exam - Right-Upper Extremity Strength Normal and Lower Extremity Strength Normal.    Assessment & Plan  RIGHT INGUINAL HERNIA (K40.90) Impression: Discussed laparoscopic and open techniques. Discussed the use of mesh potential long-term consequences of mesh use and chronic pain syndromes. Discuss the pros and cons and risk of mesh use and the risk of not using mesh which includes increased risk of recurrence and recurrent surgery. Discuss recurrence rates of 3% with the use of mesh. Discussed laparoscopic and open techniques. He is opted for open repair his right inguinal hernia with mesh. We discussed using preoperative blocks to reduce postoperative pain.  Current Plans You are being scheduled for surgery- Our schedulers will call you.  You should hear from our office's scheduling department within 5 working days about the location, date, and time of surgery. We try to make accommodations for patient's preferences in scheduling surgery, but sometimes the OR schedule or the surgeon's schedule prevents [place] from making those accommodations.  If you have not heard from our office [phone]) in 5 working days, call the office and ask for your surgeon's nurse.  If you have other questions about your diagnosis, plan, or surgery, call the office and ask for your surgeon's nurse.  The anatomy & physiology of the abdominal wall and pelvic floor was discussed. The pathophysiology of hernias in the inguinal and pelvic region was discussed. Natural history risks such as progressive enlargement, pain, incarceration, and strangulation was discussed. Contributors to complications such as smoking, obesity, diabetes, prior surgery,  etc were discussed.  I feel the risks of no intervention will lead to serious problems that outweigh the operative risks; therefore, I recommended surgery to reduce and repair the hernia. I explained an open approach. I noted usual use of mesh to patch and/or buttress hernia repair  Risks such as bleeding, infection, abscess, need for further treatment, heart attack, death, and other risks were discussed. I noted a good likelihood this will help address the problem. Goals of post-operative recovery were discussed as well. Possibility that this will not correct all symptoms was explained. I stressed the importance of low-impact activity, aggressive pain control, avoiding constipation, & not pushing through pain to minimize risk of post-operative chronic pain or injury. Possibility of reherniation was discussed. We will work to minimize complications.  An educational handout further explaining the pathology & treatment options was given as well. Questions were answered. The patient expresses understanding & wishes to proceed with surgery.  Pt Education - Pamphlet Given - Hernia Surgery: discussed with patient and provided information.

## 2018-08-15 NOTE — Transfer of Care (Signed)
Immediate Anesthesia Transfer of Care Note  Patient: Robert Mclaughlin  Procedure(s) Performed: RIGHT INGUINAL HERNIA REPAIR WITH MESH (Right Groin)  Patient Location: PACU  Anesthesia Type:General  Level of Consciousness: drowsy  Airway & Oxygen Therapy: Patient Spontanous Breathing and Patient connected to face mask oxygen  Post-op Assessment: Report given to RN and Post -op Vital signs reviewed and stable  Post vital signs: Reviewed and stable  Last Vitals:  Vitals Value Taken Time  BP 149/71 08/15/2018 10:10 AM  Temp    Pulse 39 08/15/2018 10:13 AM  Resp 16 08/15/2018 10:13 AM  SpO2 100 % 08/15/2018 10:13 AM  Vitals shown include unvalidated device data.  Last Pain:  Vitals:   08/15/18 0751  TempSrc: Oral  PainSc: 0-No pain         Complications: No apparent anesthesia complications

## 2018-08-16 ENCOUNTER — Encounter (HOSPITAL_BASED_OUTPATIENT_CLINIC_OR_DEPARTMENT_OTHER): Payer: Self-pay | Admitting: Surgery

## 2018-08-21 DIAGNOSIS — R351 Nocturia: Secondary | ICD-10-CM | POA: Diagnosis not present

## 2018-08-21 DIAGNOSIS — N401 Enlarged prostate with lower urinary tract symptoms: Secondary | ICD-10-CM | POA: Diagnosis not present

## 2018-08-21 DIAGNOSIS — R3912 Poor urinary stream: Secondary | ICD-10-CM | POA: Diagnosis not present

## 2018-08-28 ENCOUNTER — Ambulatory Visit (INDEPENDENT_AMBULATORY_CARE_PROVIDER_SITE_OTHER): Payer: Medicare Other | Admitting: *Deleted

## 2018-08-28 DIAGNOSIS — I639 Cerebral infarction, unspecified: Secondary | ICD-10-CM

## 2018-08-28 LAB — CUP PACEART REMOTE DEVICE CHECK
Date Time Interrogation Session: 20200617071534
Implantable Pulse Generator Implant Date: 20190506
Pulse Gen Serial Number: 9374188

## 2018-08-28 NOTE — Progress Notes (Signed)
Called patient to do preadm screening prior to surgery. Pt reports his surgery was moved up and has already been done on June 4. Current June 24 date is the previously scheduled date. Called Juliann Pulse (surgery scheduler at [facility]) and [facility] to cancel surgery since already performed.

## 2018-08-31 ENCOUNTER — Other Ambulatory Visit (HOSPITAL_COMMUNITY): Admission: RE | Admit: 2018-08-31 | Payer: Medicare Other | Source: Ambulatory Visit

## 2018-09-04 ENCOUNTER — Encounter (HOSPITAL_BASED_OUTPATIENT_CLINIC_OR_DEPARTMENT_OTHER): Admission: RE | Payer: Self-pay | Source: Home / Self Care

## 2018-09-04 ENCOUNTER — Ambulatory Visit (HOSPITAL_BASED_OUTPATIENT_CLINIC_OR_DEPARTMENT_OTHER): Admission: RE | Admit: 2018-09-04 | Payer: Medicare Other | Source: Home / Self Care | Admitting: Surgery

## 2018-09-04 SURGERY — REPAIR, HERNIA, INGUINAL, ADULT
Anesthesia: General | Laterality: Right

## 2018-09-09 NOTE — Progress Notes (Signed)
Loop Recorder  

## 2018-09-16 ENCOUNTER — Other Ambulatory Visit: Payer: Self-pay

## 2018-09-16 ENCOUNTER — Ambulatory Visit (INDEPENDENT_AMBULATORY_CARE_PROVIDER_SITE_OTHER): Payer: Medicare Other | Admitting: Internal Medicine

## 2018-09-16 ENCOUNTER — Telehealth: Payer: Self-pay | Admitting: Internal Medicine

## 2018-09-16 ENCOUNTER — Encounter: Payer: Self-pay | Admitting: Internal Medicine

## 2018-09-16 VITALS — BP 106/68 | HR 65 | Ht 70.0 in | Wt 178.6 lb

## 2018-09-16 DIAGNOSIS — I480 Paroxysmal atrial fibrillation: Secondary | ICD-10-CM | POA: Diagnosis not present

## 2018-09-16 DIAGNOSIS — I639 Cerebral infarction, unspecified: Secondary | ICD-10-CM | POA: Diagnosis not present

## 2018-09-16 NOTE — Telephone Encounter (Signed)

## 2018-09-16 NOTE — Progress Notes (Signed)
HPI Robert Mclaughlin returns today for followup. He has a h/o cryptogenic stroke times 2 and underwent PFO closure but then had another stroke. He was found to have what appears to be atrial fib vs MAT and I recommended starting an [place]. He has done well in the interim. He denies chest pain or sob. No edema. He remains active.  No limitation. Allergies  Allergen Reactions  . Oxycodone     Rash/hives  . Shellfish Allergy   . Latanoprost Other (See Comments)    Ineffective   . Lumigan [Bimatoprost] Other (See Comments)    Ineffective     Current Outpatient Medications  Medication Sig Dispense Refill  . apixaban (ELIQUIS) 5 MG TABS tablet Take 1 tablet (5 mg total) by mouth 2 (two) times daily. 60 tablet 6  . ARTIFICIAL TEAR OP Place 1 drop into both eyes daily.     [name] Kitchen b complex vitamins tablet Take 1 tablet by mouth at bedtime.    . carbamide peroxide (DEBROX) 6.5 % OTIC solution Place 5 drops into the left ear as needed.    . diclofenac sodium (VOLTAREN) 1 % GEL APPLY 1 APPLICATION TOPICALLY 4 (FOUR) TIMES DAILY AS NEEDED (MUSCLE PAIN). 100 g 3  . EPINEPHrine 0.3 mg/0.3 mL IJ SOAJ injection Inject 1 mL into the skin as needed for allergies.    [name] Kitchen ibuprofen (ADVIL,MOTRIN) 400 MG tablet Take 400 mg by mouth as needed.    . Multiple Vitamin (MULTIVITAMIN) capsule Take 1 capsule by mouth daily.    . Saw Palmetto 500 MG CAPS Take 1 capsule by mouth daily.    . simvastatin (ZOCOR) 20 MG tablet Take 20 mg by mouth at bedtime.     . tamsulosin (FLOMAX) 0.4 MG CAPS capsule Take 1 capsule by mouth daily.    . traMADol (ULTRAM) 50 MG tablet Take 1 tablet (50 mg total) by mouth every 6 (six) hours as needed. 15 tablet 0   No current facility-administered medications for this visit.      Past Medical History:  Diagnosis Date  . A-fib ([place])   . Arthritis    "left knee" (04/11/2016)  . Carotid artery occlusion   . Dysrhythmia    afib  . Migraine equivalent    "none in years" (04/11/2016)   . PFO (patent foramen ovale)     dx 2008; closed 03/2016 by Dr Burt Knack  . Stroke [facility]) 2008   denies residual on 04/11/2016  . UTI (urinary tract infection)     ROS:   All systems reviewed and negative except as noted in the HPI.   Past Surgical History:  Procedure Laterality Date  . INGUINAL HERNIA REPAIR Right 08/15/2018   Procedure: RIGHT INGUINAL HERNIA REPAIR WITH MESH;  Surgeon: Erroll Luna, MD;  Location: [facility];  Service: General;  Laterality: Right;  . LOOP RECORDER INSERTION N/A 07/16/2017   Procedure: LOOP RECORDER INSERTION;  Surgeon: Evans Lance, MD;  Location: [facility];  Service: Cardiovascular;  Laterality: N/A;  . MASS EXCISION Right 2017   "precancerous growth on my leg"  . MOUTH SURGERY     tooth extraction and root extraction   . PATENT FORAMEN OVALE CLOSURE  04/11/2016  . PATENT FORAMEN OVALE CLOSURE N/A 04/11/2016   Procedure: Patent [place](PFO) Closure;  Surgeon: Sherren Mocha, MD;  Location: [facility];  Service: Cardiovascular;  Laterality: N/A;  . TEE WITHOUT CARDIOVERSION N/A 07/16/2017   Procedure: TRANSESOPHAGEAL  ECHOCARDIOGRAM (TEE);  Surgeon: Lelon Perla, MD;  Location: [facility];  Service: Cardiovascular;  Laterality: N/A;  . TONSILLECTOMY    . TOOTH EXTRACTION    . TOTAL KNEE ARTHROPLASTY Left 08/22/2016   Procedure: LEFT TOTAL KNEE ARTHROPLASTY;  Surgeon: Meredith Pel, MD;  Location: [place] [facility];  Service: Orthopedics;  Laterality: Left;     Family History  Problem Relation Age of Onset  . Heart disease Mother        Heart Disease before age 84  . Varicose Veins Mother   . Hyperlipidemia Father      Social History   Socioeconomic History  . Marital status: Married    Spouse name: Not on file  . Number of children: Not on file  . Years of education: Not on file  . Highest education level: Not on file  Occupational History  . Not on file  Social Needs  . Financial resource  strain: Not on file  . Food insecurity    Worry: Not on file    Inability: Not on file  . Transportation needs    Medical: Not on file    Non-medical: Not on file  Tobacco Use  . Smoking status: Former Smoker    Packs/day: 1.00    Years: 6.00    Pack years: 6.00    Quit date: 03/13/1969    Years since quitting: 49.5  . Smokeless tobacco: Never Used  Substance and Sexual Activity  . Alcohol use: Yes    Alcohol/week: 14.0 standard drinks    Types: 14 Cans of beer per week    Comment: 2 drinks per day  . Drug use: No  . Sexual activity: Yes  Lifestyle  . Physical activity    Days per week: Not on file    Minutes per session: Not on file  . Stress: Not on file  Relationships  . Social Herbalist on phone: Not on file    Gets together: Not on file    Attends religious service: Not on file    Active member of club or organization: Not on file    Attends meetings of clubs or organizations: Not on file    Relationship status: Not on file  . Intimate partner violence    Fear of current or ex partner: Not on file    Emotionally abused: Not on file    Physically abused: Not on file    Forced sexual activity: Not on file  Other Topics Concern  . Not on file  Social History Narrative  . Not on file     BP 106/68   Pulse 65   Ht 5\' 10"  (1.778 m)   Wt 178 lb 9.6 oz (81 kg)   SpO2 97%   BMI 25.63 kg/m   Physical Exam:  Well appearing 73 yo man, NAD HEENT: Unremarkable Neck:  6 cm JVD, no thyromegally Lymphatics:  No adenopathy Back:  No CVA tenderness Lungs:  Clear with no wheezes HEART:  Regular rate rhythm, no murmurs, no rubs, no clicks Abd:  soft, positive bowel sounds, no organomegally, no rebound, no guarding Ext:  2 plus pulses, no edema, no cyanosis, no clubbing Skin:  No rashes no nodules Neuro:  CN II through XII intact, motor grossly intact  EKG - nsr  DEVICE  Normal device function.  See PaceArt for details. PAF vs MAT  Assess/Plan: 1.  Cryptogenic stroke - he will continue his [place].  2. Probable PAF -  he is minimally symptomatic. He will continue his current meds.  3. Dyslipidemia - he will continue his statin.  Mikle Bosworth.D.

## 2018-09-16 NOTE — Patient Instructions (Addendum)
Medication Instructions:  Your physician recommends that you continue on your current medications as directed. Please refer to the Current Medication list given to you today.  Labwork: None ordered.  Testing/Procedures: None ordered.  Follow-Up: Your physician wants you to follow-up in: 6 months with Dr. Lovena Le. You will receive a reminder letter in the mail two months in advance. If you don't receive a letter, please call our office to schedule the follow-up appointment.  Remote monitoring is used to monitor your loop recorder monthly.   Any Other Special Instructions Will Be Listed Below (If Applicable).  If you need a refill on your cardiac medications before your next appointment, please call your pharmacy.

## 2018-09-18 ENCOUNTER — Other Ambulatory Visit: Payer: Self-pay | Admitting: Internal Medicine

## 2018-09-20 DIAGNOSIS — Z1283 Encounter for screening for malignant neoplasm of skin: Secondary | ICD-10-CM | POA: Diagnosis not present

## 2018-09-20 DIAGNOSIS — L820 Inflamed seborrheic keratosis: Secondary | ICD-10-CM | POA: Diagnosis not present

## 2018-09-20 DIAGNOSIS — D225 Melanocytic nevi of trunk: Secondary | ICD-10-CM | POA: Diagnosis not present

## 2018-09-27 DIAGNOSIS — N3000 Acute cystitis without hematuria: Secondary | ICD-10-CM | POA: Diagnosis not present

## 2018-09-29 LAB — CUP PACEART REMOTE DEVICE CHECK
Date Time Interrogation Session: 20200719092425
Implantable Pulse Generator Implant Date: 20190506
Pulse Gen Serial Number: 9374188

## 2018-09-30 ENCOUNTER — Ambulatory Visit (INDEPENDENT_AMBULATORY_CARE_PROVIDER_SITE_OTHER): Payer: Medicare Other | Admitting: *Deleted

## 2018-09-30 DIAGNOSIS — I639 Cerebral infarction, unspecified: Secondary | ICD-10-CM | POA: Diagnosis not present

## 2018-10-09 DIAGNOSIS — Z0000 Encounter for general adult medical examination without abnormal findings: Secondary | ICD-10-CM | POA: Diagnosis not present

## 2018-10-09 DIAGNOSIS — E7800 Pure hypercholesterolemia, unspecified: Secondary | ICD-10-CM | POA: Diagnosis not present

## 2018-10-09 DIAGNOSIS — R972 Elevated prostate specific antigen [PSA]: Secondary | ICD-10-CM | POA: Diagnosis not present

## 2018-10-09 DIAGNOSIS — I4891 Unspecified atrial fibrillation: Secondary | ICD-10-CM | POA: Diagnosis not present

## 2018-10-09 DIAGNOSIS — Z79899 Other long term (current) drug therapy: Secondary | ICD-10-CM | POA: Diagnosis not present

## 2018-10-11 DIAGNOSIS — N3000 Acute cystitis without hematuria: Secondary | ICD-10-CM | POA: Diagnosis not present

## 2018-10-11 DIAGNOSIS — N410 Acute prostatitis: Secondary | ICD-10-CM | POA: Diagnosis not present

## 2018-10-15 NOTE — Progress Notes (Signed)
Loop Recorder  

## 2018-10-29 ENCOUNTER — Ambulatory Visit (INDEPENDENT_AMBULATORY_CARE_PROVIDER_SITE_OTHER): Payer: Medicare Other | Admitting: *Deleted

## 2018-10-29 DIAGNOSIS — I639 Cerebral infarction, unspecified: Secondary | ICD-10-CM

## 2018-10-29 DIAGNOSIS — I480 Paroxysmal atrial fibrillation: Secondary | ICD-10-CM

## 2018-10-30 LAB — CUP PACEART REMOTE DEVICE CHECK
Date Time Interrogation Session: 20200818071748
Implantable Pulse Generator Implant Date: 20190506
Pulse Gen Serial Number: 9374188

## 2018-11-03 IMAGING — CR DG CHEST 2V
2 series · 2 of 2 positions shown · non-contrast
Comparison: None.

CLINICAL DATA: 70-year-old male with a history of knee surgery

EXAM:
CHEST  2 VIEW

[w chest pa]
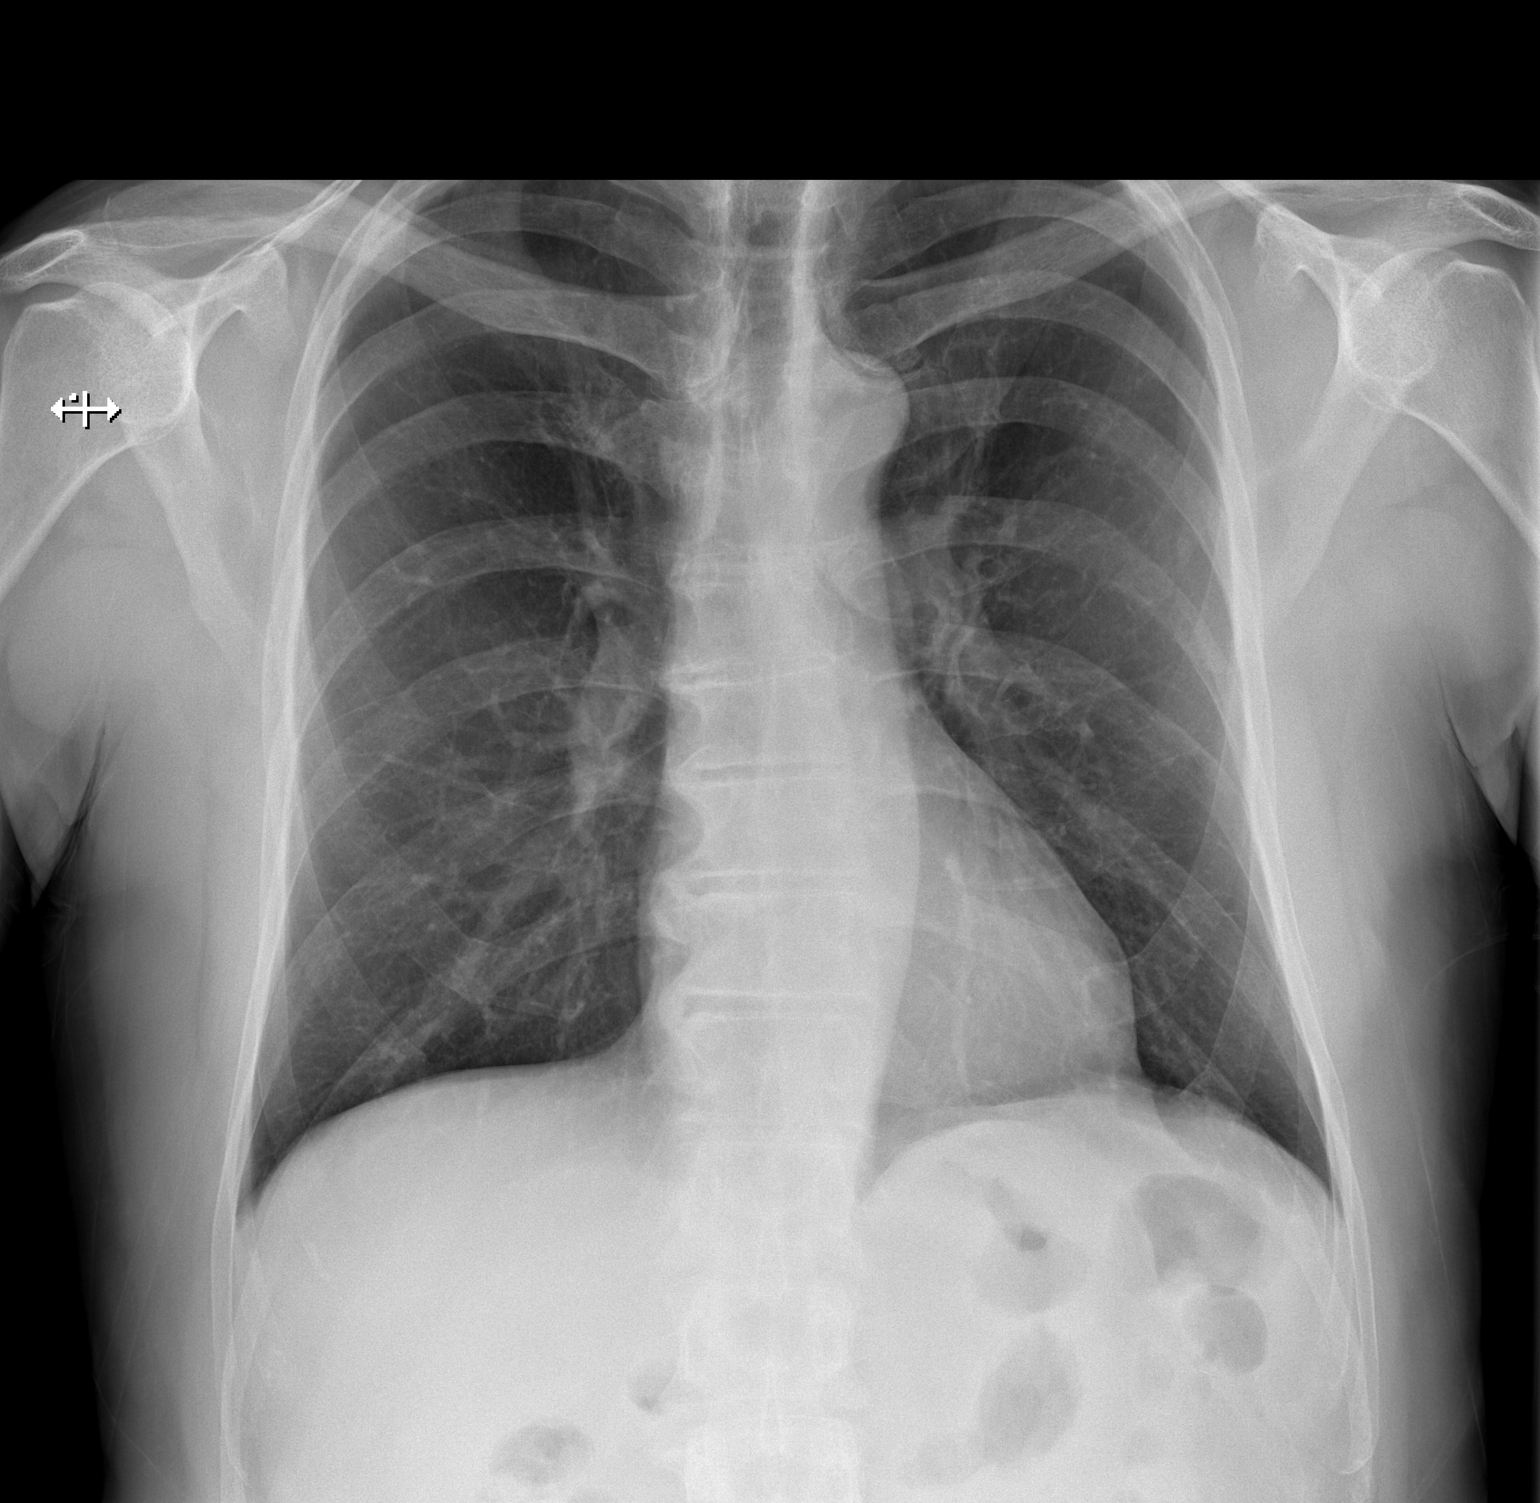

[w chest lat]
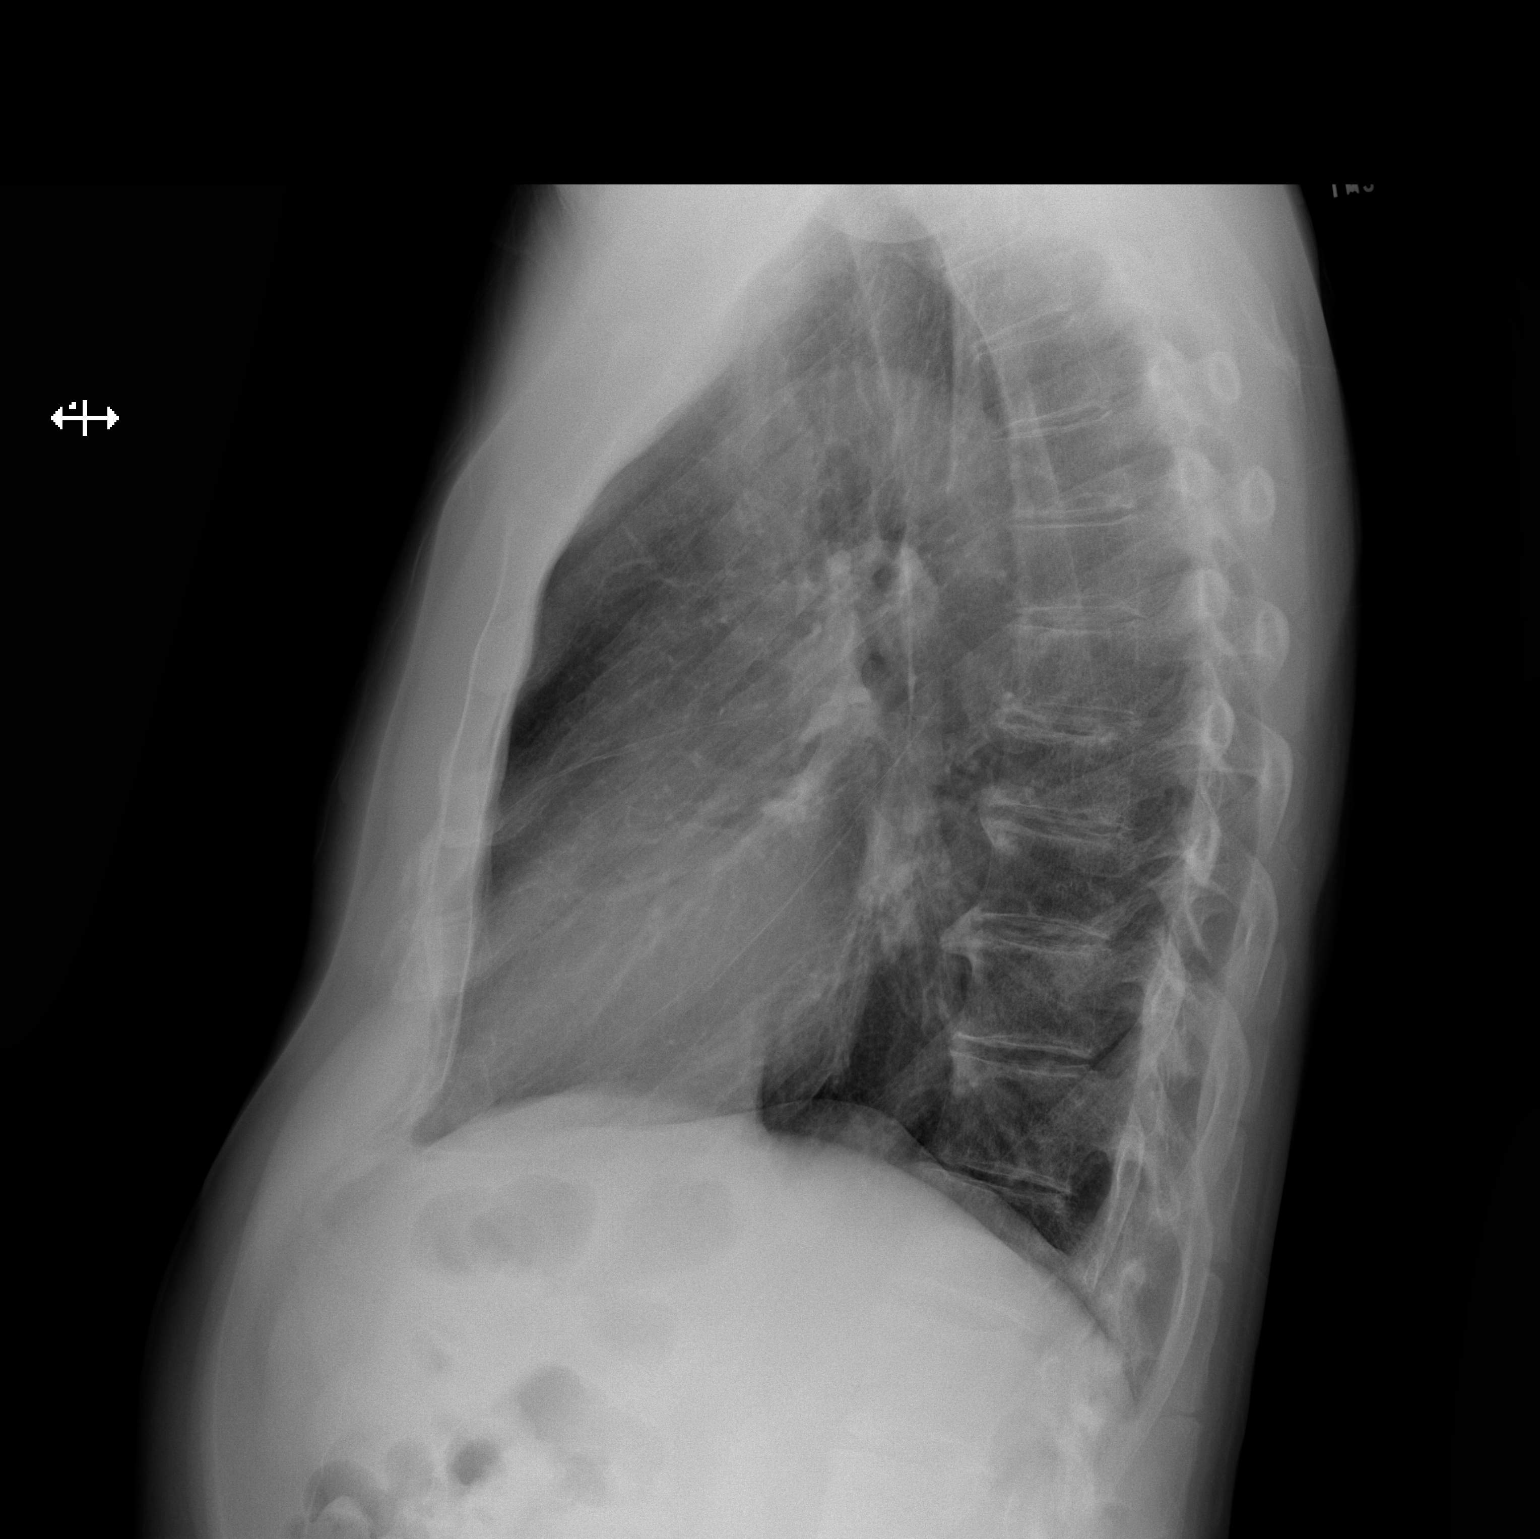

[2 of 2 positions shown; findings below may reference images not displayed]

FINDINGS: The heart size and mediastinal contours are within normal limits.
Both lungs are clear. The visualized skeletal structures are
unremarkable.
IMPRESSION: No radiographic evidence of acute cardiopulmonary disease.

## 2018-11-04 DIAGNOSIS — H5310 Unspecified subjective visual disturbances: Secondary | ICD-10-CM | POA: Diagnosis not present

## 2018-11-04 DIAGNOSIS — H401131 Primary open-angle glaucoma, bilateral, mild stage: Secondary | ICD-10-CM | POA: Diagnosis not present

## 2018-11-06 NOTE — Progress Notes (Signed)
ILR

## 2018-11-28 ENCOUNTER — Ambulatory Visit (INDEPENDENT_AMBULATORY_CARE_PROVIDER_SITE_OTHER): Payer: Medicare Other | Admitting: *Deleted

## 2018-11-28 DIAGNOSIS — I639 Cerebral infarction, unspecified: Secondary | ICD-10-CM

## 2018-11-28 LAB — CUP PACEART REMOTE DEVICE CHECK
Date Time Interrogation Session: 20200917121246
Implantable Pulse Generator Implant Date: 20190506
Pulse Gen Serial Number: 9374188

## 2018-12-02 NOTE — Progress Notes (Signed)
Loop Recorder  

## 2018-12-04 ENCOUNTER — Other Ambulatory Visit: Payer: Self-pay | Admitting: Internal Medicine

## 2018-12-04 NOTE — Telephone Encounter (Signed)
Prescription refill request for Eliquis received.  Last office visit: Robert Mclaughlin (09-16-2018) Scr: 1.10 (08-12-2018) Age: 73 y.o. Weight: 81 kg  Prescription refill sent.

## 2018-12-06 DIAGNOSIS — Z23 Encounter for immunization: Secondary | ICD-10-CM | POA: Diagnosis not present

## 2018-12-13 DIAGNOSIS — Z79899 Other long term (current) drug therapy: Secondary | ICD-10-CM | POA: Diagnosis not present

## 2018-12-13 DIAGNOSIS — Z0000 Encounter for general adult medical examination without abnormal findings: Secondary | ICD-10-CM | POA: Diagnosis not present

## 2018-12-13 DIAGNOSIS — R972 Elevated prostate specific antigen [PSA]: Secondary | ICD-10-CM | POA: Diagnosis not present

## 2018-12-13 DIAGNOSIS — E7800 Pure hypercholesterolemia, unspecified: Secondary | ICD-10-CM | POA: Diagnosis not present

## 2018-12-31 ENCOUNTER — Ambulatory Visit (INDEPENDENT_AMBULATORY_CARE_PROVIDER_SITE_OTHER): Payer: Medicare Other | Admitting: *Deleted

## 2018-12-31 DIAGNOSIS — I480 Paroxysmal atrial fibrillation: Secondary | ICD-10-CM

## 2018-12-31 DIAGNOSIS — I639 Cerebral infarction, unspecified: Secondary | ICD-10-CM

## 2018-12-31 LAB — CUP PACEART REMOTE DEVICE CHECK
Date Time Interrogation Session: 20201020081048
Implantable Pulse Generator Implant Date: 20190506
Pulse Gen Serial Number: 9374188

## 2019-01-10 DIAGNOSIS — R3121 Asymptomatic microscopic hematuria: Secondary | ICD-10-CM | POA: Diagnosis not present

## 2019-01-10 DIAGNOSIS — R351 Nocturia: Secondary | ICD-10-CM | POA: Diagnosis not present

## 2019-01-10 DIAGNOSIS — N401 Enlarged prostate with lower urinary tract symptoms: Secondary | ICD-10-CM | POA: Diagnosis not present

## 2019-01-10 DIAGNOSIS — R972 Elevated prostate specific antigen [PSA]: Secondary | ICD-10-CM | POA: Diagnosis not present

## 2019-01-16 NOTE — Progress Notes (Signed)
ILR

## 2019-01-27 DIAGNOSIS — L308 Other specified dermatitis: Secondary | ICD-10-CM | POA: Diagnosis not present

## 2019-02-12 DIAGNOSIS — N401 Enlarged prostate with lower urinary tract symptoms: Secondary | ICD-10-CM | POA: Diagnosis not present

## 2019-02-12 DIAGNOSIS — R351 Nocturia: Secondary | ICD-10-CM | POA: Diagnosis not present

## 2019-02-12 DIAGNOSIS — R3911 Hesitancy of micturition: Secondary | ICD-10-CM | POA: Diagnosis not present

## 2019-02-20 ENCOUNTER — Telehealth: Payer: Self-pay | Admitting: Internal Medicine

## 2019-02-20 NOTE — Telephone Encounter (Signed)
Left message to call back  

## 2019-02-20 NOTE — Telephone Encounter (Signed)
   [facility] Pre-operative Risk Assessment    Request for surgical clearance:  1. What type of surgery is being performed? Resume for BPH (in office procedure)  2. When is this surgery scheduled? 03-24-19  3. What type of clearance is required (medical clearance vs. Pharmacy clearance to hold med vs. Both)? Both  4. Are there any medications that need to be held prior to surgery and how long? Eliquis for 2 days prior to procedure  5. Practice name and name of physician performing surgery? Dr. Link Snuffer, [facility]  6. What is your office phone number: [phone]   7.   What is your office fax number: [phone]  8.   Anesthesia type (None, local, MAC, general) ? [place]   Johnna Acosta 02/20/2019, 3:30 PM  _________________________________________________________________   (provider comments below)

## 2019-02-20 NOTE — Telephone Encounter (Signed)
Pharmacy please comment on Eliquis and then I'll contact the patient.  Kerin Ransom PA-C 02/20/2019 3:44 PM

## 2019-02-20 NOTE — Telephone Encounter (Signed)
Patient with diagnosis of afib on Eliquis for anticoagulation.    Procedure: Resume for BPH  Date of procedure: 03/24/19  CHADS2-VASc score of  3 (AGE, stroke/tia x 2)  CrCl 68 ml/min  Due to patients history of multiple strokes he is high risk off anticoagulation. Would recommend patient hold Eliquis only one day prior to procedure.

## 2019-02-21 ENCOUNTER — Ambulatory Visit (INDEPENDENT_AMBULATORY_CARE_PROVIDER_SITE_OTHER): Payer: Medicare Other | Admitting: *Deleted

## 2019-02-21 DIAGNOSIS — I639 Cerebral infarction, unspecified: Secondary | ICD-10-CM

## 2019-02-21 LAB — CUP PACEART REMOTE DEVICE CHECK
Date Time Interrogation Session: 20201211041640
Implantable Pulse Generator Implant Date: 20190506
Pulse Gen Serial Number: 9374188

## 2019-02-21 NOTE — Telephone Encounter (Signed)
Left VM

## 2019-02-21 NOTE — Telephone Encounter (Signed)
   Primary Cardiologist: Jenkins Rouge, MD  Chart reviewed as part of pre-operative protocol coverage. Patient was contacted 02/21/2019 in reference to pre-operative risk assessment for pending surgery as outlined below.  Robert Mclaughlin was last seen on 09/16/18 by Dr. Lovena Le.  Since that day, Robert Mclaughlin has done well. He can complete more than 4.0 METS without angina and has no history of MI.   Given his history of stroke, anticoagulation request was sent to our clinical pharmacist:  Patient with diagnosis of afib on Eliquis for anticoagulation.    Procedure: Resumefor BPH Date of procedure: 03/24/19  CHADS2-VASc score of  3 (AGE, stroke/tia x 2)  CrCl 68 ml/min  Due to patients history of multiple strokes he is high risk off anticoagulation. Would recommend patient hold Eliquis only one day prior to procedure.   Therefore, based on ACC/AHA guidelines, the patient would be at acceptable risk for the planned procedure without further cardiovascular testing.   I will route this recommendation to the requesting party via Epic fax function and remove from pre-op pool.  Please call with questions.  Tami Lin Walfred Bettendorf, PA 02/21/2019, 10:35 AM

## 2019-02-21 NOTE — Telephone Encounter (Signed)
Follow up ° ° ° ° °Please return call to patient °

## 2019-03-24 ENCOUNTER — Ambulatory Visit (INDEPENDENT_AMBULATORY_CARE_PROVIDER_SITE_OTHER): Payer: Medicare Other | Admitting: *Deleted

## 2019-03-24 DIAGNOSIS — I639 Cerebral infarction, unspecified: Secondary | ICD-10-CM

## 2019-03-24 LAB — CUP PACEART REMOTE DEVICE CHECK
Date Time Interrogation Session: 20210111043343
Implantable Pulse Generator Implant Date: 20190506
Pulse Gen Serial Number: 9374188

## 2019-03-28 DIAGNOSIS — R351 Nocturia: Secondary | ICD-10-CM | POA: Diagnosis not present

## 2019-03-28 DIAGNOSIS — R350 Frequency of micturition: Secondary | ICD-10-CM | POA: Diagnosis not present

## 2019-03-28 DIAGNOSIS — N401 Enlarged prostate with lower urinary tract symptoms: Secondary | ICD-10-CM | POA: Diagnosis not present

## 2019-03-29 NOTE — Progress Notes (Signed)
ILR remote 

## 2019-04-09 ENCOUNTER — Ambulatory Visit: Payer: Medicare Other

## 2019-04-17 ENCOUNTER — Telehealth: Payer: Self-pay

## 2019-04-17 NOTE — Telephone Encounter (Signed)
Patient gave consent to virtual visit and will be prepared for visit with BP and HR.

## 2019-04-18 ENCOUNTER — Telehealth (INDEPENDENT_AMBULATORY_CARE_PROVIDER_SITE_OTHER): Payer: Medicare Other | Admitting: Cardiovascular Disease

## 2019-04-18 ENCOUNTER — Encounter: Payer: Self-pay | Admitting: Cardiovascular Disease

## 2019-04-18 ENCOUNTER — Other Ambulatory Visit: Payer: Self-pay

## 2019-04-18 ENCOUNTER — Ambulatory Visit: Payer: Medicare Other | Attending: Internal Medicine

## 2019-04-18 VITALS — BP 122/80 | HR 82

## 2019-04-18 DIAGNOSIS — I480 Paroxysmal atrial fibrillation: Secondary | ICD-10-CM

## 2019-04-18 DIAGNOSIS — I639 Cerebral infarction, unspecified: Secondary | ICD-10-CM

## 2019-04-18 DIAGNOSIS — Z23 Encounter for immunization: Secondary | ICD-10-CM | POA: Insufficient documentation

## 2019-04-18 NOTE — Progress Notes (Signed)
   Covid-19 Vaccination Clinic  Name:  Robert Mclaughlin    MRN: [id number] DOB: May 21, 1945  04/18/2019  [name] was observed post Covid-19 immunization for 15 minutes without incidence. He was provided with Vaccine Information Sheet and instruction to access the V-Safe system.   [name] was instructed to call 911 with any severe reactions post vaccine: [name] Kitchen Difficulty breathing  . Swelling of your face and throat  . A fast heartbeat  . A bad rash all over your body  . Dizziness and weakness    Immunizations Administered    Name Date Dose VIS Date Route   Pfizer COVID-19 Vaccine 04/18/2019 10:21 AM 0.3 mL 02/21/2019 Intramuscular   Manufacturer: [place]   Lot: [id number]   [place]: [id number]

## 2019-04-18 NOTE — Progress Notes (Signed)
Cardiology Office Note   Date:  04/18/2019   ID:  Robert Mclaughlin June 24, 1945, MRN [id number]  PCP:  Lawerance Cruel, MD  Cardiologist:   Jenkins Rouge, MD   No chief complaint on file.     History of Present Illness: Robert Mclaughlin is a 74 y.o. male who presents for f/u of PFO closure  January 2018  he was a pre operative evaluation for left TKR. Former smoker with ? CVA in 2008 Rx with plavix noted to have PFO. Subsequently had PFO closure with Dr Burt Knack on 03/22/16 Then he had left TKR with Dr Alvan Dame with no neurologic sequelae. Plavix d/c after 6 months and no longer needs SBE prophylaxis   Admitted 07/16/17 with CVA to the cerebellum and left parietal lobe with vertigo TTE 07/13/17 reviewed and showed normal EF 55% with mild AR/MR TEE showed normal EF with MVP anterior leaflet moderate MR mild AR PFO closure intact. No right to left shunt on bubble. Carotids with no severe stenosis Had ILR placed for cryptogenic stroke r/o PAF.   Down sized house this year and has not traveled as much No residual deficits neurologically  He is from [place] and went to [place]. Retired and travels a lot before [place] procedure went well. Cannot have coffee or alcohol for a month. Dr Gloriann Loan did some sort Of "steam" procedure to help with urination   Past Medical History:  Diagnosis Date  . A-fib ([place])   . Arthritis    "left knee" (04/11/2016)  . Carotid artery occlusion   . Dysrhythmia    afib  . Migraine equivalent    "none in years" (04/11/2016)  . PFO (patent foramen ovale)     dx 2008; closed 03/2016 by Dr Burt Knack  . Stroke [facility]) 2008   denies residual on 04/11/2016  . UTI (urinary tract infection)     Past Surgical History:  Procedure Laterality Date  . INGUINAL HERNIA REPAIR Right 08/15/2018   Procedure: RIGHT INGUINAL HERNIA REPAIR WITH MESH;  Surgeon: Erroll Luna, MD;  Location: [place];  Service: General;  Laterality: Right;  . LOOP RECORDER  INSERTION N/A 07/16/2017   Procedure: LOOP RECORDER INSERTION;  Surgeon: Evans Lance, MD;  Location: [place] [facility];  Service: Cardiovascular;  Laterality: N/A;  . MASS EXCISION Right 2017   "precancerous growth on my leg"  . MOUTH SURGERY     tooth extraction and root extraction   . PATENT FORAMEN OVALE CLOSURE  04/11/2016  . PATENT FORAMEN OVALE CLOSURE N/A 04/11/2016   Procedure: Patent Forament Ovale(PFO) Closure;  Surgeon: Robert Mocha, MD;  Location: [place] [facility];  Service: Cardiovascular;  Laterality: N/A;  . TEE WITHOUT CARDIOVERSION N/A 07/16/2017   Procedure: TRANSESOPHAGEAL ECHOCARDIOGRAM (TEE);  Surgeon: Lelon Perla, MD;  Location: [facility];  Service: Cardiovascular;  Laterality: N/A;  . TONSILLECTOMY    . TOOTH EXTRACTION    . TOTAL KNEE ARTHROPLASTY Left 08/22/2016   Procedure: LEFT TOTAL KNEE ARTHROPLASTY;  Surgeon: Meredith Pel, MD;  Location: [place];  Service: Orthopedics;  Laterality: Left;     Current Outpatient Medications  Medication Sig Dispense Refill  . ARTIFICIAL TEAR OP Place 1 drop into both eyes daily.     [name] Kitchen b complex vitamins tablet Take 1 tablet by mouth at bedtime.    . carbamide peroxide (DEBROX) 6.5 % OTIC solution Place 5 drops into the left ear as needed.    [name] Kitchen  diclofenac sodium (VOLTAREN) 1 % GEL APPLY 1 APPLICATION TOPICALLY 4 (FOUR) TIMES DAILY AS NEEDED (MUSCLE PAIN). 100 g 3  . ELIQUIS 5 MG TABS tablet TAKE 1 TABLET BY MOUTH TWICE A DAY 60 tablet 5  . EPINEPHrine 0.3 mg/0.3 mL IJ SOAJ injection Inject 1 mL into the skin as needed for allergies.    [name] Kitchen ibuprofen (ADVIL,MOTRIN) 400 MG tablet Take 400 mg by mouth as needed.    . Multiple Vitamin (MULTIVITAMIN) capsule Take 1 capsule by mouth daily.    . Saw Palmetto 500 MG CAPS Take 1 capsule by mouth daily.    . simvastatin (ZOCOR) 20 MG tablet Take 20 mg by mouth at bedtime.     . tamsulosin (FLOMAX) 0.4 MG CAPS capsule Take 1 capsule by mouth daily.    . traMADol (ULTRAM)  50 MG tablet Take 1 tablet (50 mg total) by mouth every 6 (six) hours as needed. 15 tablet 0   No current facility-administered medications for this visit.    Allergies:   Oxycodone, Shellfish allergy, Latanoprost, and Lumigan [bimatoprost]    Social History:  The patient  reports that he quit smoking about 50 years ago. He has a 6.00 pack-year smoking history. He has never used smokeless tobacco. He reports current alcohol use of about 14.0 standard drinks of alcohol per week. He reports that he does not use drugs.   Family History:  The patient's family history includes Heart disease in his mother; Hyperlipidemia in his father; Varicose Veins in his mother.    ROS:  Please see the history of present illness.   Otherwise, review of systems are positive for none.   All other systems are reviewed and negative.    PHYSICAL EXAM: VS:  There were no vitals taken for this visit. , BMI There is no height or weight on file to calculate BMI. Affect appropriate Healthy:  appears stated age 20: normal Neck supple with no adenopathy JVP normal no bruits no thyromegaly Lungs clear with no wheezing and good diaphragmatic motion Heart:  S1/S2 no murmur, no rub, gallop or click PMI normal ILR under left pectoral area  Abdomen: benighn, BS positve, no tenderness, no AAA no bruit.  No HSM or HJR Distal pulses intact with no bruits No edema Neuro non-focal Skin warm and dry Post left TKR      EKG:  07/12/17 SR rate 65 ICRBBB PVC    Recent Labs: 08/12/2018: ALT 24; BUN 18; Creatinine, Ser 1.10; Hemoglobin 14.8; Platelets 183; Potassium 4.8; Sodium 137    Lipid Panel    Component Value Date/Time   CHOL 108 07/13/2017 0515   TRIG 51 07/13/2017 0515   HDL 46 07/13/2017 0515   CHOLHDL 2.3 07/13/2017 0515   VLDL 10 07/13/2017 0515   LDLCALC 52 07/13/2017 0515      Wt Readings from Last 3 Encounters:  09/16/18 178 lb 9.6 oz (81 kg)  08/15/18 177 lb 14.6 oz (80.7 kg)  03/29/18 173 lb  2 oz (78.5 kg)      Other studies Reviewed: Additional studies/ records that were reviewed today include: Notes from Dr Lovena Le and Burt Knack Notes from cardiology 2018 on. Labs, ECG TTE , TEE MRI brain .    ASSESSMENT AND PLAN:  1.  PFO closure:  Intact by TEE done 07/16/17  Continue ASA no need for plavix or SBE at this time 2. CVA: recurrent after PFO closure carotids ok ILR no PAF f/u with Dr Leonie Man Stroke study 3. HLD:  Continue statin labs with primary  4. Ortho: post left TKR f/u Dr Alvan Dame Improvied    Current medicines are reviewed at length with the patient today.  The patient does not have concerns regarding medicines.  The following changes have been made:  no change  Labs/ tests ordered today include: None  No orders of the defined types were placed in this encounter.    Disposition:   FU with me in a year ILR monitoring through EP     Signed, Jenkins Rouge, MD  04/18/2019 8:22 AM    [facility] [place], [place], [place]  [address] Phone: [phone]; Fax: [phone]

## 2019-04-18 NOTE — Patient Instructions (Signed)
Medication Instructions:   *If you need a refill on your cardiac medications before your next appointment, please call your pharmacy*  Lab Work:  If you have labs (blood work) drawn today and your tests are completely normal, you will receive your results only by: [name] Kitchen MyChart Message (if you have MyChart) OR . A paper copy in the mail If you have any lab test that is abnormal or we need to change your treatment, we will call you to review the results.  Follow-Up: At [facility], you and your health needs are our priority.  As part of our continuing mission to provide you with exceptional heart care, we have created designated Provider Care Teams.  These Care Teams include your primary Cardiologist (physician) and Advanced Practice Providers (APPs -  Physician Assistants and Nurse Practitioners) who all work together to provide you with the care you need, when you need it.  Your next appointment:   1 year(s)  The format for your next appointment:   In Person  Provider:   You may see Jenkins Rouge, MD or one of the following Advanced Practice Providers on your designated Care Team:    Truitt Merle, NP  Cecilie Kicks, NP  Kathyrn Drown, NP

## 2019-04-20 ENCOUNTER — Ambulatory Visit: Payer: Medicare Other

## 2019-04-22 DIAGNOSIS — L304 Erythema intertrigo: Secondary | ICD-10-CM | POA: Diagnosis not present

## 2019-04-24 ENCOUNTER — Other Ambulatory Visit: Payer: Self-pay

## 2019-04-24 ENCOUNTER — Encounter: Payer: Self-pay | Admitting: Internal Medicine

## 2019-04-24 ENCOUNTER — Ambulatory Visit (INDEPENDENT_AMBULATORY_CARE_PROVIDER_SITE_OTHER): Payer: Medicare Other | Admitting: Internal Medicine

## 2019-04-24 VITALS — BP 134/72 | HR 76 | Ht 70.0 in | Wt 190.8 lb

## 2019-04-24 DIAGNOSIS — I480 Paroxysmal atrial fibrillation: Secondary | ICD-10-CM

## 2019-04-24 DIAGNOSIS — I639 Cerebral infarction, unspecified: Secondary | ICD-10-CM | POA: Diagnosis not present

## 2019-04-24 NOTE — Patient Instructions (Signed)
Medication Instructions:  °Your physician recommends that you continue on your current medications as directed. Please refer to the Current Medication list given to you today. ° °Labwork: °None ordered. ° °Testing/Procedures: °None ordered. ° °Follow-Up: °Your physician wants you to follow-up in: one year with Dr. Taylor.   You will receive a reminder letter in the mail two months in advance. If you don't receive a letter, please call our office to schedule the follow-up appointment. ° °Remote monthly monitoring is used to monitor your loop recorder. ° °Any Other Special Instructions Will Be Listed Below (If Applicable). ° °If you need a refill on your cardiac medications before your next appointment, please call your pharmacy.  ° °

## 2019-04-24 NOTE — Progress Notes (Signed)
HPI [name] returns today for followup of his ILR and PAF and cryptogenic stroke. He is a pleasant 74 yo man with the above who underwent ILR insertion and was then found to have PAF. He has done well in the interim. He denies chest pain, palpitations or sob. He has brief episodes of atrial fib. No bleeding on systemic anti-coagulation. His ILR is approaching ERI. Allergies  Allergen Reactions  . Oxycodone     Rash/hives  . Shellfish Allergy   . Latanoprost Other (See Comments)    Ineffective   . Lumigan [Bimatoprost] Other (See Comments)    Ineffective     Current Outpatient Medications  Medication Sig Dispense Refill  . ARTIFICIAL TEAR OP Place 1 drop into both eyes daily.     [name] Kitchen b complex vitamins tablet Take 1 tablet by mouth at bedtime.    . carbamide peroxide (DEBROX) 6.5 % OTIC solution Place 5 drops into the left ear as needed.    . diclofenac sodium (VOLTAREN) 1 % GEL APPLY 1 APPLICATION TOPICALLY 4 (FOUR) TIMES DAILY AS NEEDED (MUSCLE PAIN). 100 g 3  . ELIQUIS 5 MG TABS tablet TAKE 1 TABLET BY MOUTH TWICE A DAY 60 tablet 5  . EPINEPHrine 0.3 mg/0.3 mL IJ SOAJ injection Inject 1 mL into the skin as needed for allergies.    . Multiple Vitamin (MULTIVITAMIN) capsule Take 1 capsule by mouth daily.    . Saw Palmetto 500 MG CAPS Take 1 capsule by mouth daily.    . simvastatin (ZOCOR) 20 MG tablet Take 20 mg by mouth at bedtime.     . tamsulosin (FLOMAX) 0.4 MG CAPS capsule Take 1 capsule by mouth daily.     No current facility-administered medications for this visit.     Past Medical History:  Diagnosis Date  . A-fib ([place])   . Arthritis    "left knee" (04/11/2016)  . Carotid artery occlusion   . Dysrhythmia    afib  . Migraine equivalent    "none in years" (04/11/2016)  . PFO (patent foramen ovale)     dx 2008; closed 03/2016 by Dr Burt Knack  . Stroke [facility]) 2008   denies residual on 04/11/2016  . UTI (urinary tract infection)     ROS:   All systems  reviewed and negative except as noted in the HPI.   Past Surgical History:  Procedure Laterality Date  . INGUINAL HERNIA REPAIR Right 08/15/2018   Procedure: RIGHT INGUINAL HERNIA REPAIR WITH MESH;  Surgeon: Erroll Luna, MD;  Location: [place];  Service: General;  Laterality: Right;  . LOOP RECORDER INSERTION N/A 07/16/2017   Procedure: LOOP RECORDER INSERTION;  Surgeon: Evans Lance, MD;  Location: [place] [facility];  Service: Cardiovascular;  Laterality: N/A;  . MASS EXCISION Right 2017   "precancerous growth on my leg"  . MOUTH SURGERY     tooth extraction and root extraction   . PATENT FORAMEN OVALE CLOSURE  04/11/2016  . PATENT FORAMEN OVALE CLOSURE N/A 04/11/2016   Procedure: Patent Forament Ovale(PFO) Closure;  Surgeon: Sherren Mocha, MD;  Location: [place] [facility];  Service: Cardiovascular;  Laterality: N/A;  . TEE WITHOUT CARDIOVERSION N/A 07/16/2017   Procedure: TRANSESOPHAGEAL ECHOCARDIOGRAM (TEE);  Surgeon: Lelon Perla, MD;  Location: [facility] ENDOSCOPY;  Service: Cardiovascular;  Laterality: N/A;  . TONSILLECTOMY    . TOOTH EXTRACTION    . TOTAL KNEE ARTHROPLASTY Left 08/22/2016   Procedure: LEFT TOTAL KNEE ARTHROPLASTY;  Surgeon: Meredith Pel, MD;  Location: [place];  Service: Orthopedics;  Laterality: Left;     Family History  Problem Relation Age of Onset  . Heart disease Mother        Heart Disease before age 36  . Varicose Veins Mother   . Hyperlipidemia Father      Social History   Socioeconomic History  . Marital status: Married    Spouse name: Not on file  . Number of children: Not on file  . Years of education: Not on file  . Highest education level: Not on file  Occupational History  . Not on file  Tobacco Use  . Smoking status: Former Smoker    Packs/day: 1.00    Years: 6.00    Pack years: 6.00    Quit date: 03/13/1969    Years since quitting: 50.1  . Smokeless tobacco: Never Used  Substance and Sexual Activity  .  Alcohol use: Yes    Alcohol/week: 14.0 standard drinks    Types: 14 Cans of beer per week    Comment: 2 drinks per day  . Drug use: No  . Sexual activity: Yes  Other Topics Concern  . Not on file  Social History Narrative  . Not on file   Social Determinants of Health   Financial Resource Strain:   . Difficulty of Paying Living Expenses: Not on file  Food Insecurity:   . Worried About Charity fundraiser in the Last Year: Not on file  . Ran Out of Food in the Last Year: Not on file  Transportation Needs:   . Lack of Transportation (Medical): Not on file  . Lack of Transportation (Non-Medical): Not on file  Physical Activity:   . Days of Exercise per Week: Not on file  . Minutes of Exercise per Session: Not on file  Stress:   . Feeling of Stress : Not on file  Social Connections:   . Frequency of Communication with Friends and Family: Not on file  . Frequency of Social Gatherings with Friends and Family: Not on file  . Attends Religious Services: Not on file  . Active Member of Clubs or Organizations: Not on file  . Attends Archivist Meetings: Not on file  . Marital Status: Not on file  Intimate Partner Violence:   . Fear of Current or Ex-Partner: Not on file  . Emotionally Abused: Not on file  . Physically Abused: Not on file  . Sexually Abused: Not on file     BP 134/72   Pulse 76   Ht 5\' 10"  (1.778 m)   Wt 190 lb 12.8 oz (86.5 kg)   SpO2 97%   BMI 27.38 kg/m   Physical Exam:  Well appearing NAD HEENT: Unremarkable Neck:  6 cm JVD, no thyromegally Lymphatics:  No adenopathy Back:  No CVA tenderness Lungs:  Clear with no wheezes HEART:  Regular rate rhythm, no murmurs, no rubs, no clicks Abd:  soft, positive bowel sounds, no organomegally, no rebound, no guarding Ext:  2 plus pulses, no edema, no cyanosis, no clubbing Skin:  No rashes no nodules Neuro:  CN II through XII intact, motor grossly intact  EKG - nsr  DEVICE  Normal device  function.  See PaceArt for details. Approaching end of service on the ILR.  Assess/Plan: 1. PAF - he is asymptomatic. He will continue systemic anti-coagulation. ILR interrogation suggests he is rarely in atrial fib. 2. HTN - his bp is controlled.  3. ILR - his device is approaching end of service. I discussed the possibility of removing the device vs leaving it in place. He is reflecting on this.  Robert Mclaughlin.D.

## 2019-04-25 ENCOUNTER — Ambulatory Visit (INDEPENDENT_AMBULATORY_CARE_PROVIDER_SITE_OTHER): Payer: Medicare Other | Admitting: *Deleted

## 2019-04-25 DIAGNOSIS — I639 Cerebral infarction, unspecified: Secondary | ICD-10-CM

## 2019-04-25 LAB — CUP PACEART REMOTE DEVICE CHECK
Date Time Interrogation Session: 20210212041908
Implantable Pulse Generator Implant Date: 20190506
Pulse Gen Serial Number: 9374188

## 2019-04-25 NOTE — Progress Notes (Signed)
ILR Remote 

## 2019-04-28 NOTE — Addendum Note (Signed)
Addended by: Rose Phi on: 04/28/2019 05:48 PM   Modules accepted: Orders

## 2019-04-30 DIAGNOSIS — I4891 Unspecified atrial fibrillation: Secondary | ICD-10-CM | POA: Diagnosis not present

## 2019-04-30 DIAGNOSIS — E7800 Pure hypercholesterolemia, unspecified: Secondary | ICD-10-CM | POA: Diagnosis not present

## 2019-04-30 DIAGNOSIS — N401 Enlarged prostate with lower urinary tract symptoms: Secondary | ICD-10-CM | POA: Diagnosis not present

## 2019-05-02 DIAGNOSIS — N401 Enlarged prostate with lower urinary tract symptoms: Secondary | ICD-10-CM | POA: Diagnosis not present

## 2019-05-02 DIAGNOSIS — R350 Frequency of micturition: Secondary | ICD-10-CM | POA: Diagnosis not present

## 2019-05-09 DIAGNOSIS — H401131 Primary open-angle glaucoma, bilateral, mild stage: Secondary | ICD-10-CM | POA: Diagnosis not present

## 2019-05-13 ENCOUNTER — Ambulatory Visit: Payer: Medicare Other | Attending: Internal Medicine

## 2019-05-13 DIAGNOSIS — Z23 Encounter for immunization: Secondary | ICD-10-CM | POA: Insufficient documentation

## 2019-05-13 NOTE — Progress Notes (Signed)
   Covid-19 Vaccination Clinic  Name:  IBIN CIPRES    MRN: [id number] DOB: 09/23/1945  05/13/2019  Mr. Summersett was observed post Covid-19 immunization for 15 minutes without incident. He was provided with Vaccine Information Sheet and instruction to access the V-Safe system.   Mr. Orosco was instructed to call 911 with any severe reactions post vaccine: [name] Kitchen Difficulty breathing  . Swelling of face and throat  . A fast heartbeat  . A bad rash all over body  . Dizziness and weakness   Immunizations Administered    Name Date Dose VIS Date Route   Pfizer COVID-19 Vaccine 05/13/2019 11:29 AM 0.3 mL 02/21/2019 Intramuscular   Manufacturer: [place]   Lot: [id number]   [place]: [id number]

## 2019-05-28 ENCOUNTER — Telehealth: Payer: Self-pay

## 2019-05-28 NOTE — Telephone Encounter (Signed)
Spoke with patient to remind of missed remote transmission 

## 2019-06-10 ENCOUNTER — Other Ambulatory Visit: Payer: Self-pay | Admitting: Internal Medicine

## 2019-06-10 NOTE — Telephone Encounter (Signed)
Age 74, weight 86.5kg, SCr 1.1 on 08/12/18, afib indication, last OV Feb 2021

## 2019-06-16 LAB — CUP PACEART REMOTE DEVICE CHECK
Date Time Interrogation Session: 20210405145741
Implantable Pulse Generator Implant Date: 20190506
Pulse Gen Serial Number: 9374188

## 2019-06-17 ENCOUNTER — Ambulatory Visit (INDEPENDENT_AMBULATORY_CARE_PROVIDER_SITE_OTHER): Payer: Medicare Other | Admitting: *Deleted

## 2019-06-17 DIAGNOSIS — I639 Cerebral infarction, unspecified: Secondary | ICD-10-CM

## 2019-06-18 NOTE — Progress Notes (Signed)
ILR Remote 

## 2019-07-10 DIAGNOSIS — N401 Enlarged prostate with lower urinary tract symptoms: Secondary | ICD-10-CM | POA: Diagnosis not present

## 2019-07-10 DIAGNOSIS — I4891 Unspecified atrial fibrillation: Secondary | ICD-10-CM | POA: Diagnosis not present

## 2019-07-10 DIAGNOSIS — E7800 Pure hypercholesterolemia, unspecified: Secondary | ICD-10-CM | POA: Diagnosis not present

## 2019-07-21 ENCOUNTER — Ambulatory Visit (INDEPENDENT_AMBULATORY_CARE_PROVIDER_SITE_OTHER): Payer: Medicare Other | Admitting: *Deleted

## 2019-07-21 DIAGNOSIS — I639 Cerebral infarction, unspecified: Secondary | ICD-10-CM

## 2019-07-22 ENCOUNTER — Telehealth: Payer: Self-pay

## 2019-07-22 LAB — CUP PACEART REMOTE DEVICE CHECK
Date Time Interrogation Session: 20210511120457
Implantable Pulse Generator Implant Date: 20190506
Pulse Gen Serial Number: 9374188

## 2019-07-22 NOTE — Progress Notes (Signed)
Carelink Summary Report / Loop Recorder 

## 2019-07-22 NOTE — Telephone Encounter (Signed)
Spoke with patient to remind of missed remote transmission 

## 2019-07-30 DIAGNOSIS — R351 Nocturia: Secondary | ICD-10-CM | POA: Diagnosis not present

## 2019-07-30 DIAGNOSIS — R972 Elevated prostate specific antigen [PSA]: Secondary | ICD-10-CM | POA: Diagnosis not present

## 2019-07-30 DIAGNOSIS — N401 Enlarged prostate with lower urinary tract symptoms: Secondary | ICD-10-CM | POA: Diagnosis not present

## 2019-07-30 DIAGNOSIS — R3915 Urgency of urination: Secondary | ICD-10-CM | POA: Diagnosis not present

## 2019-08-08 ENCOUNTER — Telehealth: Payer: Self-pay

## 2019-08-08 NOTE — Telephone Encounter (Signed)
Merlin alert received, battery for loop recorder has reached RRT.  Per last office notes, this was discussed with Dr. Lovena Le and pt deciding on whether to remove device or leave in place.  At this time pt does not wish to have removed, he v/u that it is not going to be monitoring anything and he will need appt at least virtual visit to discuss procedure to remove since it cannot be done in office.    Updated Merlin and Paceart to reflect devie no long monitoring.

## 2019-09-11 ENCOUNTER — Other Ambulatory Visit: Payer: Self-pay | Admitting: *Deleted

## 2019-09-11 DIAGNOSIS — I6529 Occlusion and stenosis of unspecified carotid artery: Secondary | ICD-10-CM

## 2019-09-24 ENCOUNTER — Ambulatory Visit (HOSPITAL_COMMUNITY)
Admission: RE | Admit: 2019-09-24 | Discharge: 2019-09-24 | Disposition: A | Discharge status: Home / Self Care | Payer: Medicare Other | Source: Ambulatory Visit | Attending: Vascular Surgery | Admitting: Vascular Surgery

## 2019-09-24 ENCOUNTER — Ambulatory Visit (INDEPENDENT_AMBULATORY_CARE_PROVIDER_SITE_OTHER): Payer: Medicare Other | Admitting: Physician Assistant

## 2019-09-24 ENCOUNTER — Other Ambulatory Visit: Payer: Self-pay

## 2019-09-24 VITALS — BP 125/78 | HR 67 | Temp 97.6°F | Resp 20 | Ht 70.0 in | Wt 185.8 lb

## 2019-09-24 DIAGNOSIS — I6529 Occlusion and stenosis of unspecified carotid artery: Secondary | ICD-10-CM

## 2019-09-24 DIAGNOSIS — I639 Cerebral infarction, unspecified: Secondary | ICD-10-CM

## 2019-09-24 NOTE — Progress Notes (Signed)
HISTORY AND PHYSICAL     CC:  follow up. Requesting Provider:  Lawerance Cruel, MD  HPI: This is a 74 y.o. male here for follow up for carotid artery stenosis.  Pt has hx of CVA in 2008.  When he was seen in 2019, he had an MRI showing a left cerebellar infarct.  At the time of the CVA, he said he was stumbling around and could not find his balance, which was 2 weeks prior to his office visit.  His sx had resolved at that visit.  He did not have any one sided weakness, speech changes or visual changes.  During that hospitalization, he had a CTA and that did not demonstrate any large vessel acute occlusion involving the carotids.  He had an echo revealing EF of 55% with mild AR/MR TEE showed normal EF with MVP anterior leaflet moderate MR mild AR PFO closure intact. No right to left shunt on bubble. Carotids with no severe stenosis Had ILR placed for cryptogenic stroke r/o PAF.   Pt returns today for follow up.  Pt denies any amaurosis fugax, speech difficulties, weakness, numbness, paralysis or clumsiness.  He states that since his last visit, he had the loop recorder placed and was found to have afib and it was felt this was probably the cause of the previous stroke.    He does not have any pain in his legs or non healing wounds.  He has hx of left knee surgery.  He has hx of right inguinal hernia repair.    He enjoys playing golf weekly and enjoys [place].   The pt is on a statin for cholesterol management.  The pt is not on a daily aspirin.   Other AC:  Eliquis The pt is not on meds for hypertension.   The pt is not diabetic.   Tobacco hx:  Former  Pt does not have family hx of AAA.  Past Medical History:  Diagnosis Date  . A-fib ([place])   . Arthritis    "left knee" (04/11/2016)  . Carotid artery occlusion   . Dysrhythmia    afib  . Migraine equivalent    "none in years" (04/11/2016)  . PFO (patent foramen ovale)     dx 2008; closed 03/2016 by Dr Burt Knack  . Stroke  [facility]) 2008   denies residual on 04/11/2016  . UTI (urinary tract infection)     Past Surgical History:  Procedure Laterality Date  . INGUINAL HERNIA REPAIR Right 08/15/2018   Procedure: RIGHT INGUINAL HERNIA REPAIR WITH MESH;  Surgeon: Erroll Luna, MD;  Location: [place];  Service: General;  Laterality: Right;  . LOOP RECORDER INSERTION N/A 07/16/2017   Procedure: LOOP RECORDER INSERTION;  Surgeon: Evans Lance, MD;  Location: [place] [facility];  Service: Cardiovascular;  Laterality: N/A;  . MASS EXCISION Right 2017   "precancerous growth on my leg"  . MOUTH SURGERY     tooth extraction and root extraction   . PATENT FORAMEN OVALE CLOSURE  04/11/2016  . PATENT FORAMEN OVALE CLOSURE N/A 04/11/2016   Procedure: Patent [place](PFO) Closure;  Surgeon: Sherren Mocha, MD;  Location: [place] [facility];  Service: Cardiovascular;  Laterality: N/A;  . TEE WITHOUT CARDIOVERSION N/A 07/16/2017   Procedure: TRANSESOPHAGEAL ECHOCARDIOGRAM (TEE);  Surgeon: Lelon Perla, MD;  Location: [facility] ENDOSCOPY;  Service: Cardiovascular;  Laterality: N/A;  . TONSILLECTOMY    . TOOTH EXTRACTION    . TOTAL KNEE ARTHROPLASTY Left  08/22/2016   Procedure: LEFT TOTAL KNEE ARTHROPLASTY;  Surgeon: Meredith Pel, MD;  Location: [place];  Service: Orthopedics;  Laterality: Left;    Allergies  Allergen Reactions  . Oxycodone     Rash/hives  . Shellfish Allergy   . Latanoprost Other (See Comments)    Ineffective   . Lumigan [Bimatoprost] Other (See Comments)    Ineffective    Current Outpatient Medications  Medication Sig Dispense Refill  . ARTIFICIAL TEAR OP Place 1 drop into both eyes daily.     [name] Kitchen b complex vitamins tablet Take 1 tablet by mouth at bedtime.    . carbamide peroxide (DEBROX) 6.5 % OTIC solution Place 5 drops into the left ear as needed.    . diclofenac sodium (VOLTAREN) 1 % GEL APPLY 1 APPLICATION TOPICALLY 4 (FOUR) TIMES DAILY AS NEEDED (MUSCLE PAIN). 100 g 3  .  ELIQUIS 5 MG TABS tablet TAKE 1 TABLET BY MOUTH TWICE A DAY 60 tablet 5  . EPINEPHrine 0.3 mg/0.3 mL IJ SOAJ injection Inject 1 mL into the skin as needed for allergies.    . Multiple Vitamin (MULTIVITAMIN) capsule Take 1 capsule by mouth daily.    . Saw Palmetto 500 MG CAPS Take 1 capsule by mouth daily.    . simvastatin (ZOCOR) 20 MG tablet Take 20 mg by mouth at bedtime.     . tamsulosin (FLOMAX) 0.4 MG CAPS capsule Take 1 capsule by mouth daily.     No current facility-administered medications for this visit.    Family History  Problem Relation Age of Onset  . Heart disease Mother        Heart Disease before age 80  . Varicose Veins Mother   . Hyperlipidemia Father     Social History   Socioeconomic History  . Marital status: Married    Spouse name: Not on file  . Number of children: Not on file  . Years of education: Not on file  . Highest education level: Not on file  Occupational History  . Not on file  Tobacco Use  . Smoking status: Former Smoker    Packs/day: 1.00    Years: 6.00    Pack years: 6.00    Quit date: 03/13/1969    Years since quitting: 50.5  . Smokeless tobacco: Never Used  Vaping Use  . Vaping Use: Never used  Substance and Sexual Activity  . Alcohol use: Yes    Alcohol/week: 14.0 standard drinks    Types: 14 Cans of beer per week    Comment: 2 drinks per day  . Drug use: No  . Sexual activity: Yes  Other Topics Concern  . Not on file  Social History Narrative  . Not on file   Social Determinants of Health   Financial Resource Strain:   . Difficulty of Paying Living Expenses:   Food Insecurity:   . Worried About Charity fundraiser in the Last Year:   . Arboriculturist in the Last Year:   Transportation Needs:   . Film/video editor (Medical):   [name] Kitchen Lack of Transportation (Non-Medical):   Physical Activity:   . Days of Exercise per Week:   . Minutes of Exercise per Session:   Stress:   . Feeling of Stress :   Social Connections:     . Frequency of Communication with Friends and Family:   . Frequency of Social Gatherings with Friends and Family:   . Attends Religious Services:   .  Active Member of Clubs or Organizations:   . Attends Archivist Meetings:   [name] Kitchen Marital Status:   Intimate Partner Violence:   . Fear of Current or Ex-Partner:   . Emotionally Abused:   [name] Kitchen Physically Abused:   . Sexually Abused:      REVIEW OF SYSTEMS:   [X]  denotes positive finding, [ ]  denotes negative finding Cardiac  Comments:  Chest pain or chest pressure:    Shortness of breath upon exertion:    Short of breath when lying flat:    Irregular heart rhythm: x afib on Eliquis      Vascular    Pain in calf, thigh, or hip brought on by ambulation:    Pain in feet at night that wakes you up from your sleep:     Blood clot in your veins:    Leg swelling:         Pulmonary    Oxygen at home:    Productive cough:     Wheezing:         Neurologic    Sudden weakness in arms or legs:     Sudden numbness in arms or legs:     Sudden onset of difficulty speaking or slurred speech:    Temporary loss of vision in one eye:     Problems with dizziness:         Gastrointestinal    Blood in stool:     Vomited blood:         Genitourinary    Burning when urinating:     Blood in urine:        Psychiatric    Major depression:         Hematologic    Bleeding problems:    Problems with blood clotting too easily:        Skin    Rashes or ulcers:        Constitutional    Fever or chills:      PHYSICAL EXAMINATION:  Today's Vitals   09/24/19 1029  BP: 129/83  Pulse: 67  Resp: 20  Temp: 97.6 F (36.4 C)  TempSrc: Temporal  SpO2: 98%  Weight: 185 lb 12.8 oz (84.3 kg)  Height: 5\' 10"  (1.778 m)   Body mass index is 26.66 kg/m.   General:  WDWN in NAD; vital signs documented above Gait: Not observed HENT: WNL, normocephalic Pulmonary: normal non-labored breathing Cardiac: regular HR, without murmur  without carotid bruits Abdomen: soft, NT, no masses Skin: without rashes Vascular Exam/Pulses:  Right Left  Radial 2+ (normal) 2+ (normal)  Popliteal Unable to palpate  Unable to palpate   DP 2+ (normal) 2+ (normal)  PT 2+ (normal) 2+ (normal)   Extremities: without ischemic changes, without Gangrene , without cellulitis; without open wounds;  Musculoskeletal: no muscle wasting or atrophy  Neurologic: A&O X 3 Psychiatric:  The pt has Normal affect.   Non-Invasive Vascular Imaging:   Carotid Duplex on 09/24/2019: Right:  1-39% ICA stenosis Left:  1-39% ICA stenosis Vertebrals: Bilateral vertebral arteries demonstrate antegrade flow.  Subclavians: Normal flow hemodynamics were seen in bilateral subclavian arteries.  Previous Carotid duplex on 07/23/2017: Right: 1-39% ICA stenosis Left:   1-39% ICA stenosis  CTA head/neck 07/12/2017: CTA neck: 1. Patent carotid and vertebral arteries. No dissection, aneurysm, or hemodynamically significant stenosis utilizing NASCET criteria.  CTA head: 1. Patent anterior and posterior intracranial circulation. No large vessel occlusion, aneurysm, or significant stenosis   ASSESSMENT/PLAN:: 74 y.o.  male here for follow up carotid artery stenosis.  -pt doing well and remains asymptomatic.  His carotid duplex is 1-39% bilaterally and essentially unchanged.  Continue statin.  He is not on aspirin due to being on Eliquis for afib.  -discussed s/s of stroke with pt and he understands should he develop any of these sx, he will go to the nearest ER. -pt will f/u in 3 years with PA given he has been asymptomatic and his duplex is unchanged.  He is in agreement with this plan.  -pt will call sooner should they have any issues.   Leontine Locket, [facility] [phone]  Clinic MD:  Oneida Alar

## 2019-10-29 DIAGNOSIS — Z125 Encounter for screening for malignant neoplasm of prostate: Secondary | ICD-10-CM | POA: Diagnosis not present

## 2019-10-29 DIAGNOSIS — R351 Nocturia: Secondary | ICD-10-CM | POA: Diagnosis not present

## 2019-10-29 DIAGNOSIS — R972 Elevated prostate specific antigen [PSA]: Secondary | ICD-10-CM | POA: Diagnosis not present

## 2019-10-29 DIAGNOSIS — N401 Enlarged prostate with lower urinary tract symptoms: Secondary | ICD-10-CM | POA: Diagnosis not present

## 2019-11-07 DIAGNOSIS — H524 Presbyopia: Secondary | ICD-10-CM | POA: Diagnosis not present

## 2019-11-07 DIAGNOSIS — H401131 Primary open-angle glaucoma, bilateral, mild stage: Secondary | ICD-10-CM | POA: Diagnosis not present

## 2019-11-10 DIAGNOSIS — I4891 Unspecified atrial fibrillation: Secondary | ICD-10-CM | POA: Diagnosis not present

## 2019-11-10 DIAGNOSIS — N401 Enlarged prostate with lower urinary tract symptoms: Secondary | ICD-10-CM | POA: Diagnosis not present

## 2019-11-10 DIAGNOSIS — E7800 Pure hypercholesterolemia, unspecified: Secondary | ICD-10-CM | POA: Diagnosis not present

## 2019-11-24 ENCOUNTER — Ambulatory Visit: Payer: Self-pay | Admitting: Surgery

## 2019-11-24 ENCOUNTER — Telehealth: Payer: Self-pay | Admitting: *Deleted

## 2019-11-24 DIAGNOSIS — K4090 Unilateral inguinal hernia, without obstruction or gangrene, not specified as recurrent: Secondary | ICD-10-CM | POA: Diagnosis not present

## 2019-11-24 NOTE — Telephone Encounter (Signed)
Patient with diagnosis of afib on Eliquis for anticoagulation.    Procedure: left inguinal hernia repair with mesh Date of procedure: TBD  CHADS2-VASc score of 3 (age, CAD, stroke)  CrCl 14mL/min Platelet count 183K  Per office protocol, patient can hold Eliquis for 1 day prior to procedure. If > 24 hour hold is required, will need MD input.

## 2019-11-24 NOTE — Telephone Encounter (Signed)
° °  [facility] Pre-operative Risk Assessment    HEARTCARE STAFF: - Please ensure there is not already an duplicate clearance open for this procedure. - Under Visit Info/Reason for Call, type in Other and utilize the format Clearance MM/DD/YY or Clearance TBD. Do not use dashes or single digits. - If request is for dental extraction, please clarify the # of teeth to be extracted.  Request for surgical clearance:  1. What type of surgery is being performed?  LEFT INGUINAL HERNIA REPAIR WITH MESH   2. When is this surgery scheduled?  TBD   3. What type of clearance is required (medical clearance vs. Pharmacy clearance to hold med vs. Both)?  BOTH  4. Are there any medications that need to be held prior to surgery and how long? ELIQUIS   5. Practice name and name of physician performing surgery?  [facility] / DR. Brantley Stage  6. What is the office phone number?  [phone]   5.   What is the office fax number?  [phone]  8.   Anesthesia type (None, local, MAC, general) ?  GENERAL   Jeanann Lewandowsky 11/24/2019, 4:23 PM  _________________________________________________________________   (provider comments below)

## 2019-11-24 NOTE — H&P (View-Only) (Signed)
Robert Mclaughlin Appointment: 11/24/2019 11:00 AM Location: [facility] Patient #: [id number] DOB: Jan 20, 1946 Married / Language: Robert Mclaughlin / Race: White Male  History of Present Illness Robert Mclaughlin Weed MD; 11/24/2019 1:05 PM) Patient words: Patient presents for evaluation of left groin bulge. He has noticed that over the last 3 months or so. Location his left groin and he's noticed a bulge pop in and pop out. Its mildly tender but nothing severe. No nausea or vomiting. No change in bowel or bladder function.  The patient is a 74 year old male.   Allergies (Chanel Teressa Senter, CMA; 11/24/2019 11:02 AM) No Known Drug Allergies [05/27/2018]: Allergies Reconciled  Medication History (Chanel Teressa Senter, CMA; 11/24/2019 11:02 AM) Simvastatin (20MG  Tablet, Oral) Active. Eliquis (5MG  Tablet, Oral) Active. Medications Reconciled    Vitals (Chanel Nolan CMA; 11/24/2019 11:02 AM) 11/24/2019 11:02 AM Weight: 183.13 lb Height: 70in Body Surface Area: 2.01 m Body Mass Index: 26.28 kg/m  Temp.: 97.75F  Pulse: 80 (Regular)  BP: 130/74(Sitting, Left Arm, Standard)        Physical Exam (Kyonna Frier A. Hero Mccathern MD; 11/24/2019 1:08 PM)  General Mental Status-Alert. General Appearance-Consistent with stated age. Hydration-Well hydrated. Voice-Normal.  Cardiovascular Note: NSR  Male Genitourinary Note: Reducible left inguinal hernia. Scar in right groin looks well-healed.  Neurologic Note: Grossly normal  Musculoskeletal Normal Exam - Left-Upper Extremity Strength Normal and Lower Extremity Strength Normal. Normal Exam - Right-Upper Extremity Strength Normal and Lower Extremity Strength Normal.    Assessment & Plan (Creedence Kunesh A. Niya Behler MD; 11/24/2019 1:09 PM)  LEFT INGUINAL HERNIA (K40.90) Impression: Recommend repair of left inguinal hernia with mesh. We'll obtain cardiac clearance given complex history of patent foramen ovale that's been fixed and  recent history of stroke with no major neurological sequela. He is on anticoagulation as well and we will have to defer to his cardiologist. The risk of hernia repair include bleeding, infection, organ injury, bowel injury, bladder injury, nerve injury recurrent hernia, blood clots, worsening of underlying condition, chronic pain, mesh use, open surgery, death, and the need for other operations. Pt agrees to proceed Total time 30 minutes  Current Plans You are being scheduled for surgery- Our schedulers will call you.  You should hear from our office's scheduling department within 5 working days about the location, date, and time of surgery. We try to make accommodations for patient's preferences in scheduling surgery, but sometimes the OR schedule or the surgeon's schedule prevents [place] from making those accommodations.  If you have not heard from our office [phone]) in 5 working days, call the office and ask for your surgeon's nurse.  If you have other questions about your diagnosis, plan, or surgery, call the office and ask for your surgeon's nurse.  Pt Education - Consent for inguinal hernia - Kinsinger: discussed with patient and provided information. Pt Education - CCS Mesh education: discussed with patient and provided information. Pt Education - Consent for inguinal hernia - Kinsinger: discussed with patient and provided information.

## 2019-11-24 NOTE — H&P (Signed)
Robert Mclaughlin Appointment: 11/24/2019 11:00 AM Location: [facility] Patient #: [id number] DOB: February 06, 1946 Married / Language: Robert Mclaughlin / Race: White Male  History of Present Illness Robert Moores A. [name] MD; 11/24/2019 1:05 PM) Patient words: Patient presents for evaluation of left groin bulge. He has noticed that over the last 3 months or so. Location his left groin and he's noticed a bulge pop in and pop out. Its mildly tender but nothing severe. No nausea or vomiting. No change in bowel or bladder function.  The patient is a 74 year old male.   Allergies (Robert Mclaughlin, Mclaughlin; 11/24/2019 11:02 AM) No Known Drug Allergies [05/27/2018]: Allergies Reconciled  Medication History (Robert Mclaughlin, Mclaughlin; 11/24/2019 11:02 AM) Simvastatin (20MG  Tablet, Oral) Active. Eliquis (5MG  Tablet, Oral) Active. Medications Reconciled    Vitals (Robert Mclaughlin; 11/24/2019 11:02 AM) 11/24/2019 11:02 AM Weight: 183.13 lb Height: 70in Body Surface Area: 2.01 m Body Mass Index: 26.28 kg/m  Temp.: 97.20F  Pulse: 80 (Regular)  BP: 130/74(Sitting, Left Arm, Standard)        Physical Exam (Robert Squillace A. Bethanee Redondo MD; 11/24/2019 1:08 PM)  General Mental Status-Alert. General Appearance-Consistent with stated age. Hydration-Well hydrated. Voice-Normal.  Cardiovascular Note: NSR  Male Genitourinary Note: Reducible left inguinal hernia. Scar in right groin looks well-healed.  Neurologic Note: Grossly normal  Musculoskeletal Normal Exam - Left-Upper Extremity Strength Normal and Lower Extremity Strength Normal. Normal Exam - Right-Upper Extremity Strength Normal and Lower Extremity Strength Normal.    Assessment & Plan (Robert Tappan A. Chadrick Sprinkle MD; 11/24/2019 1:09 PM)  LEFT INGUINAL HERNIA (K40.90) Impression: Recommend repair of left inguinal hernia with mesh. We'll obtain cardiac clearance given complex history of patent foramen ovale that's been fixed and  recent history of stroke with no major neurological sequela. He is on anticoagulation as well and we will have to defer to his cardiologist. The risk of hernia repair include bleeding, infection, organ injury, bowel injury, bladder injury, nerve injury recurrent hernia, blood clots, worsening of underlying condition, chronic pain, mesh use, open surgery, death, and the need for other operations. Pt agrees to proceed Total time 30 minutes  Current Plans You are being scheduled for surgery- Our schedulers will call you.  You should hear from our office's scheduling department within 5 working days about the location, date, and time of surgery. We try to make accommodations for patient's preferences in scheduling surgery, but sometimes the OR schedule or the surgeon's schedule prevents [place] from making those accommodations.  If you have not heard from our office [phone]) in 5 working days, call the office and ask for your surgeon's nurse.  If you have other questions about your diagnosis, plan, or surgery, call the office and ask for your surgeon's nurse.  Pt Education - Consent for inguinal hernia - Kinsinger: discussed with patient and provided information. Pt Education - CCS Mesh education: discussed with patient and provided information. Pt Education - Consent for inguinal hernia - Kinsinger: discussed with patient and provided information.

## 2019-11-24 NOTE — Telephone Encounter (Signed)
Clinical pharmacist to review Eliquis 

## 2019-11-25 NOTE — Telephone Encounter (Signed)
   Primary Cardiologist: Jenkins Rouge, MD  Chart reviewed as part of pre-operative protocol coverage. Patient was contacted 11/25/2019 in reference to pre-operative risk assessment for pending surgery as outlined below.  Robert Mclaughlin was last seen on 04/24/2019 by Dr. Lovena Le.  Since that day, Robert Mclaughlin has done well without chest pain or significant dyspnea. He is quite active and clearly able to accomplish more than 4 METS of activity on a daily basis.   Therefore, based on ACC/AHA guidelines, the patient would be at acceptable risk for the planned procedure without further cardiovascular testing.   The patient was advised that if he develops new symptoms prior to surgery to contact our office to arrange for a follow-up visit, and he verbalized understanding.  I will route this recommendation to the requesting party via Epic fax function and remove from pre-op pool. Please call with questions.  [place], [place] 11/25/2019, 6:24 PM

## 2019-12-05 DIAGNOSIS — Z79899 Other long term (current) drug therapy: Secondary | ICD-10-CM | POA: Diagnosis not present

## 2019-12-05 DIAGNOSIS — I4891 Unspecified atrial fibrillation: Secondary | ICD-10-CM | POA: Diagnosis not present

## 2019-12-05 DIAGNOSIS — Z0000 Encounter for general adult medical examination without abnormal findings: Secondary | ICD-10-CM | POA: Diagnosis not present

## 2019-12-05 DIAGNOSIS — E7800 Pure hypercholesterolemia, unspecified: Secondary | ICD-10-CM | POA: Diagnosis not present

## 2019-12-05 DIAGNOSIS — Z23 Encounter for immunization: Secondary | ICD-10-CM | POA: Diagnosis not present

## 2019-12-05 DIAGNOSIS — D6869 Other thrombophilia: Secondary | ICD-10-CM | POA: Diagnosis not present

## 2019-12-05 DIAGNOSIS — R251 Tremor, unspecified: Secondary | ICD-10-CM | POA: Diagnosis not present

## 2019-12-13 ENCOUNTER — Other Ambulatory Visit (HOSPITAL_COMMUNITY)
Admission: RE | Admit: 2019-12-13 | Discharge: 2019-12-13 | Disposition: A | Discharge status: Home / Self Care | Payer: Medicare Other | Source: Ambulatory Visit | Attending: Surgery | Admitting: Surgery

## 2019-12-13 DIAGNOSIS — Z01812 Encounter for preprocedural laboratory examination: Secondary | ICD-10-CM | POA: Diagnosis not present

## 2019-12-13 DIAGNOSIS — Z20822 Contact with and (suspected) exposure to covid-19: Secondary | ICD-10-CM | POA: Diagnosis not present

## 2019-12-13 LAB — SARS CORONAVIRUS 2 (TAT 6-24 HRS): SARS Coronavirus 2: NEGATIVE

## 2019-12-14 ENCOUNTER — Other Ambulatory Visit: Payer: Self-pay | Admitting: Internal Medicine

## 2019-12-15 ENCOUNTER — Other Ambulatory Visit: Payer: Self-pay

## 2019-12-15 ENCOUNTER — Encounter (HOSPITAL_BASED_OUTPATIENT_CLINIC_OR_DEPARTMENT_OTHER)
Admission: RE | Admit: 2019-12-15 | Discharge: 2019-12-15 | Disposition: A | Discharge status: Home / Self Care | Payer: Medicare Other | Source: Ambulatory Visit | Attending: Surgery | Admitting: Surgery

## 2019-12-15 ENCOUNTER — Encounter (HOSPITAL_BASED_OUTPATIENT_CLINIC_OR_DEPARTMENT_OTHER): Payer: Self-pay | Admitting: Surgery

## 2019-12-15 DIAGNOSIS — Z01812 Encounter for preprocedural laboratory examination: Secondary | ICD-10-CM | POA: Diagnosis not present

## 2019-12-15 LAB — CBC WITH DIFFERENTIAL/PLATELET
Abs Immature Granulocytes: 0.03 10*3/uL (ref 0.00–0.07)
Basophils Absolute: 0.1 10*3/uL (ref 0.0–0.1)
Basophils Relative: 1 %
Eosinophils Absolute: 0.1 10*3/uL (ref 0.0–0.5)
Eosinophils Relative: 2 %
HCT: 43.3 % (ref 39.0–52.0)
Hemoglobin: 14.3 g/dL (ref 13.0–17.0)
Immature Granulocytes: 0 %
Lymphocytes Relative: 23 %
Lymphs Abs: 1.7 10*3/uL (ref 0.7–4.0)
MCH: 30.2 pg (ref 26.0–34.0)
MCHC: 33 g/dL (ref 30.0–36.0)
MCV: 91.5 fL (ref 80.0–100.0)
Monocytes Absolute: 0.6 10*3/uL (ref 0.1–1.0)
Monocytes Relative: 8 %
Neutro Abs: 5 10*3/uL (ref 1.7–7.7)
Neutrophils Relative %: 66 %
Platelets: 188 10*3/uL (ref 150–400)
RBC: 4.73 MIL/uL (ref 4.22–5.81)
RDW: 12.8 % (ref 11.5–15.5)
WBC: 7.6 10*3/uL (ref 4.0–10.5)
nRBC: 0 % (ref 0.0–0.2)

## 2019-12-15 LAB — COMPREHENSIVE METABOLIC PANEL
ALT: 22 U/L (ref 0–44)
AST: 26 U/L (ref 15–41)
Albumin: 4 g/dL (ref 3.5–5.0)
Alkaline Phosphatase: 43 U/L (ref 38–126)
Anion gap: 8 (ref 5–15)
BUN: 19 mg/dL (ref 8–23)
CO2: 25 mmol/L (ref 22–32)
Calcium: 9.1 mg/dL (ref 8.9–10.3)
Chloride: 104 mmol/L (ref 98–111)
Creatinine, Ser: 1.19 mg/dL (ref 0.61–1.24)
GFR calc Af Amer: >60 mL/min (ref >60)
GFR calc non Af Amer: 60 mL/min — ABNORMAL LOW (ref >60)
Glucose, Bld: 91 mg/dL (ref 70–99)
Potassium: 5.3 mmol/L — ABNORMAL HIGH (ref 3.5–5.1)
Sodium: 137 mmol/L (ref 135–145)
Total Bilirubin: 0.9 mg/dL (ref 0.3–1.2)
Total Protein: 6.9 g/dL (ref 6.5–8.1)

## 2019-12-15 NOTE — Telephone Encounter (Signed)
Prescription refill request for Eliquis received.  Last office visit: 04/24/2019, Lovena Le Scr: 1.08, 12/05/2019 Age: 74 y.o. Weight: 84.3 kg   Prescription refill sent.

## 2019-12-15 NOTE — Progress Notes (Signed)

## 2019-12-16 NOTE — Progress Notes (Signed)
K+5.3, Dr. Marcie Bal aware, will proceed with surgery as scheduled.

## 2019-12-17 ENCOUNTER — Encounter (HOSPITAL_BASED_OUTPATIENT_CLINIC_OR_DEPARTMENT_OTHER)
Admission: RE | Disposition: A | Discharge status: Home / Self Care | Payer: Self-pay | Source: Home / Self Care | Attending: Surgery

## 2019-12-17 ENCOUNTER — Ambulatory Visit (HOSPITAL_BASED_OUTPATIENT_CLINIC_OR_DEPARTMENT_OTHER)
Admission: RE | Admit: 2019-12-17 | Discharge: 2019-12-17 | Disposition: A | Discharge status: Home / Self Care | Payer: Medicare Other | Attending: Surgery | Admitting: Surgery

## 2019-12-17 ENCOUNTER — Ambulatory Visit (HOSPITAL_BASED_OUTPATIENT_CLINIC_OR_DEPARTMENT_OTHER): Payer: Medicare Other | Admitting: Anesthesiology

## 2019-12-17 ENCOUNTER — Encounter (HOSPITAL_BASED_OUTPATIENT_CLINIC_OR_DEPARTMENT_OTHER): Payer: Self-pay | Admitting: Surgery

## 2019-12-17 ENCOUNTER — Other Ambulatory Visit: Payer: Self-pay

## 2019-12-17 DIAGNOSIS — Z87891 Personal history of nicotine dependence: Secondary | ICD-10-CM | POA: Diagnosis not present

## 2019-12-17 DIAGNOSIS — Z8774 Personal history of (corrected) congenital malformations of heart and circulatory system: Secondary | ICD-10-CM | POA: Diagnosis not present

## 2019-12-17 DIAGNOSIS — Z7901 Long term (current) use of anticoagulants: Secondary | ICD-10-CM | POA: Diagnosis not present

## 2019-12-17 DIAGNOSIS — G8918 Other acute postprocedural pain: Secondary | ICD-10-CM | POA: Diagnosis not present

## 2019-12-17 DIAGNOSIS — I480 Paroxysmal atrial fibrillation: Secondary | ICD-10-CM | POA: Diagnosis not present

## 2019-12-17 DIAGNOSIS — Z79899 Other long term (current) drug therapy: Secondary | ICD-10-CM | POA: Diagnosis not present

## 2019-12-17 DIAGNOSIS — K4090 Unilateral inguinal hernia, without obstruction or gangrene, not specified as recurrent: Secondary | ICD-10-CM | POA: Insufficient documentation

## 2019-12-17 DIAGNOSIS — Z8673 Personal history of transient ischemic attack (TIA), and cerebral infarction without residual deficits: Secondary | ICD-10-CM | POA: Diagnosis not present

## 2019-12-17 DIAGNOSIS — I739 Peripheral vascular disease, unspecified: Secondary | ICD-10-CM | POA: Diagnosis not present

## 2019-12-17 DIAGNOSIS — M1712 Unilateral primary osteoarthritis, left knee: Secondary | ICD-10-CM | POA: Diagnosis not present

## 2019-12-17 HISTORY — DX: Hyperlipidemia, unspecified: E78.5

## 2019-12-17 HISTORY — PX: INGUINAL HERNIA REPAIR: SHX194

## 2019-12-17 SURGERY — REPAIR, HERNIA, INGUINAL, ADULT
Anesthesia: General | Site: Groin | Laterality: Left

## 2019-12-17 MED ORDER — MIDAZOLAM HCL 2 MG/2ML IJ SOLN
INTRAMUSCULAR | Status: AC
  Filled 2019-12-17: qty 2

## 2019-12-17 MED ORDER — CEFAZOLIN SODIUM-DEXTROSE 2-4 GM/100ML-% IV SOLN
INTRAVENOUS | Status: AC
  Filled 2019-12-17: qty 100

## 2019-12-17 MED ORDER — ROCURONIUM BROMIDE 100 MG/10ML IV SOLN
INTRAVENOUS | Status: DC | PRN
  Administered 2019-12-17: 50 mg via INTRAVENOUS

## 2019-12-17 MED ORDER — IBUPROFEN 800 MG PO TABS: 800.0000 mg | ORAL_TABLET | Freq: Three times a day (TID) | ORAL | 0 refills | Status: DC | PRN

## 2019-12-17 MED ORDER — FENTANYL CITRATE (PF) 100 MCG/2ML IJ SOLN
INTRAMUSCULAR | Status: AC
  Filled 2019-12-17: qty 2

## 2019-12-17 MED ORDER — ACETAMINOPHEN 500 MG PO TABS
ORAL_TABLET | ORAL | Status: AC
  Filled 2019-12-17: qty 2

## 2019-12-17 MED ORDER — TRAMADOL HCL 50 MG PO TABS: 50.0000 mg | ORAL_TABLET | Freq: Four times a day (QID) | ORAL | 0 refills | Status: DC | PRN

## 2019-12-17 MED ORDER — LIDOCAINE HCL (CARDIAC) PF 100 MG/5ML IV SOSY
PREFILLED_SYRINGE | INTRAVENOUS | Status: DC | PRN
  Administered 2019-12-17: 60 mg via INTRAVENOUS

## 2019-12-17 MED ORDER — LIDOCAINE 2% (20 MG/ML) 5 ML SYRINGE
INTRAMUSCULAR | Status: AC
  Filled 2019-12-17: qty 5

## 2019-12-17 MED ORDER — CEFAZOLIN SODIUM-DEXTROSE 2-4 GM/100ML-% IV SOLN
2.0000 g | INTRAVENOUS | Status: AC
  Administered 2019-12-17: 2 g via INTRAVENOUS

## 2019-12-17 MED ORDER — PROPOFOL 10 MG/ML IV BOLUS
INTRAVENOUS | Status: DC | PRN
  Administered 2019-12-17: 150 mg via INTRAVENOUS

## 2019-12-17 MED ORDER — FENTANYL CITRATE (PF) 100 MCG/2ML IJ SOLN
25.0000 ug | INTRAMUSCULAR | Status: DC | PRN
  Administered 2019-12-17 (×2): 50 ug via INTRAVENOUS

## 2019-12-17 MED ORDER — BUPIVACAINE HCL (PF) 0.5 % IJ SOLN
INTRAMUSCULAR | Status: DC | PRN
  Administered 2019-12-17: 15 mL

## 2019-12-17 MED ORDER — ONDANSETRON HCL 4 MG/2ML IJ SOLN
INTRAMUSCULAR | Status: DC | PRN
  Administered 2019-12-17: 4 mg via INTRAVENOUS

## 2019-12-17 MED ORDER — ONDANSETRON HCL 4 MG/2ML IJ SOLN: 4.0000 mg | Freq: Once | INTRAMUSCULAR | Status: DC | PRN

## 2019-12-17 MED ORDER — ONDANSETRON HCL 4 MG/2ML IJ SOLN
INTRAMUSCULAR | Status: AC
  Filled 2019-12-17: qty 2

## 2019-12-17 MED ORDER — BUPIVACAINE LIPOSOME 1.3 % IJ SUSP
INTRAMUSCULAR | Status: DC | PRN
  Administered 2019-12-17: 10 mL

## 2019-12-17 MED ORDER — PROPOFOL 10 MG/ML IV BOLUS
INTRAVENOUS | Status: AC
  Filled 2019-12-17: qty 20

## 2019-12-17 MED ORDER — DEXAMETHASONE SODIUM PHOSPHATE 10 MG/ML IJ SOLN
INTRAMUSCULAR | Status: AC
  Filled 2019-12-17: qty 1

## 2019-12-17 MED ORDER — SUGAMMADEX SODIUM 200 MG/2ML IV SOLN
INTRAVENOUS | Status: DC | PRN
  Administered 2019-12-17: 200 mg via INTRAVENOUS

## 2019-12-17 MED ORDER — APIXABAN 5 MG PO TABS: 5.0000 mg | ORAL_TABLET | Freq: Two times a day (BID) | ORAL | 5 refills | Status: DC

## 2019-12-17 MED ORDER — CHLORHEXIDINE GLUCONATE CLOTH 2 % EX PADS: 6.0000 | MEDICATED_PAD | Freq: Once | CUTANEOUS | Status: DC

## 2019-12-17 MED ORDER — FENTANYL CITRATE (PF) 100 MCG/2ML IJ SOLN
INTRAMUSCULAR | Status: DC | PRN
  Administered 2019-12-17 (×2): 50 ug via INTRAVENOUS

## 2019-12-17 MED ORDER — LACTATED RINGERS IV SOLN: INTRAVENOUS | Status: DC

## 2019-12-17 MED ORDER — EPHEDRINE SULFATE 50 MG/ML IJ SOLN
INTRAMUSCULAR | Status: DC | PRN
  Administered 2019-12-17: 10 mg via INTRAVENOUS

## 2019-12-17 MED ORDER — GABAPENTIN 300 MG PO CAPS
ORAL_CAPSULE | ORAL | Status: AC
  Filled 2019-12-17: qty 1

## 2019-12-17 MED ORDER — ACETAMINOPHEN 500 MG PO TABS
1000.0000 mg | ORAL_TABLET | ORAL | Status: AC
  Administered 2019-12-17: 1000 mg via ORAL

## 2019-12-17 MED ORDER — FENTANYL CITRATE (PF) 100 MCG/2ML IJ SOLN
100.0000 ug | Freq: Once | INTRAMUSCULAR | Status: AC
  Administered 2019-12-17: 50 ug via INTRAVENOUS

## 2019-12-17 MED ORDER — BUPIVACAINE HCL (PF) 0.25 % IJ SOLN
INTRAMUSCULAR | Status: DC | PRN
  Administered 2019-12-17: 5.5 mL

## 2019-12-17 MED ORDER — GABAPENTIN 300 MG PO CAPS
300.0000 mg | ORAL_CAPSULE | ORAL | Status: AC
  Administered 2019-12-17: 300 mg via ORAL

## 2019-12-17 MED ORDER — 0.9 % SODIUM CHLORIDE (POUR BTL) OPTIME
TOPICAL | Status: DC | PRN
  Administered 2019-12-17: 100 mL

## 2019-12-17 MED ORDER — DEXAMETHASONE SODIUM PHOSPHATE 4 MG/ML IJ SOLN
INTRAMUSCULAR | Status: DC | PRN
  Administered 2019-12-17: 5 mg via INTRAVENOUS

## 2019-12-17 MED ORDER — MIDAZOLAM HCL 2 MG/2ML IJ SOLN: 2.0000 mg | Freq: Once | INTRAMUSCULAR | Status: DC

## 2019-12-17 SURGICAL SUPPLY — 52 items
ADH SKN CLS APL DERMABOND .7 (GAUZE/BANDAGES/DRESSINGS) ×1
APL PRP STRL LF DISP 70% ISPRP (MISCELLANEOUS) ×1
BLADE CLIPPER SURG (BLADE) IMPLANT
BLADE SURG 15 STRL LF DISP TIS (BLADE) ×1 IMPLANT
BLADE SURG 15 STRL SS (BLADE) ×3
CANISTER SUCT 1200ML W/VALVE (MISCELLANEOUS) IMPLANT
CHLORAPREP W/TINT 26 (MISCELLANEOUS) ×3 IMPLANT
CLOSURE WOUND 1/2 X4 (GAUZE/BANDAGES/DRESSINGS)
COVER BACK TABLE 60X90IN (DRAPES) ×3 IMPLANT
COVER MAYO STAND STRL (DRAPES) ×3 IMPLANT
COVER WAND RF STERILE (DRAPES) IMPLANT
DECANTER SPIKE VIAL GLASS SM (MISCELLANEOUS) IMPLANT
DERMABOND ADVANCED (GAUZE/BANDAGES/DRESSINGS) ×2
DERMABOND ADVANCED .7 DNX12 (GAUZE/BANDAGES/DRESSINGS) ×1 IMPLANT
DRAIN PENROSE 1/2X12 LTX STRL (WOUND CARE) ×3 IMPLANT
DRAPE LAPAROTOMY TRNSV 102X78 (DRAPES) ×3 IMPLANT
DRAPE UTILITY XL STRL (DRAPES) ×3 IMPLANT
ELECT COATED BLADE 2.86 ST (ELECTRODE) ×3 IMPLANT
ELECT REM PT RETURN 9FT ADLT (ELECTROSURGICAL) ×3
ELECTRODE REM PT RTRN 9FT ADLT (ELECTROSURGICAL) ×1 IMPLANT
GAUZE 4X4 16PLY RFD (DISPOSABLE) IMPLANT
GAUZE SPONGE 4X4 12PLY STRL LF (GAUZE/BANDAGES/DRESSINGS) IMPLANT
GLOVE BIOGEL PI IND STRL 8 (GLOVE) ×1 IMPLANT
GLOVE BIOGEL PI INDICATOR 8 (GLOVE) ×2
GLOVE ECLIPSE 8.0 STRL XLNG CF (GLOVE) ×3 IMPLANT
GOWN STRL REUS W/ TWL LRG LVL3 (GOWN DISPOSABLE) ×2 IMPLANT
GOWN STRL REUS W/TWL LRG LVL3 (GOWN DISPOSABLE) ×6
MESH ULTRAPRO 3X6 7.6X15CM (Mesh General) ×2 IMPLANT
NDL HYPO 25X1 1.5 SAFETY (NEEDLE) ×1 IMPLANT
NEEDLE HYPO 25X1 1.5 SAFETY (NEEDLE) ×3 IMPLANT
NS IRRIG 1000ML POUR BTL (IV SOLUTION) IMPLANT
PACK BASIN DAY SURGERY FS (CUSTOM PROCEDURE TRAY) ×3 IMPLANT
PENCIL SMOKE EVACUATOR (MISCELLANEOUS) ×3 IMPLANT
SLEEVE SCD COMPRESS KNEE MED (MISCELLANEOUS) ×3 IMPLANT
SPONGE LAP 4X18 RFD (DISPOSABLE) ×3 IMPLANT
STRIP CLOSURE SKIN 1/2X4 (GAUZE/BANDAGES/DRESSINGS) IMPLANT
SUT MON AB 4-0 PC3 18 (SUTURE) ×3 IMPLANT
SUT NOVA 0 T19/GS 22DT (SUTURE) IMPLANT
SUT NOVA NAB DX-16 0-1 5-0 T12 (SUTURE) ×6 IMPLANT
SUT VIC AB 2-0 SH 27 (SUTURE) ×3
SUT VIC AB 2-0 SH 27XBRD (SUTURE) ×1 IMPLANT
SUT VIC AB 3-0 54X BRD REEL (SUTURE) IMPLANT
SUT VIC AB 3-0 BRD 54 (SUTURE)
SUT VICRYL 3-0 CR8 SH (SUTURE) ×3 IMPLANT
SUT VICRYL AB 2 0 TIE (SUTURE) IMPLANT
SUT VICRYL AB 2 0 TIES (SUTURE)
SYR BULB EAR ULCER 3OZ GRN STR (SYRINGE) ×2 IMPLANT
SYR CONTROL 10ML LL (SYRINGE) ×3 IMPLANT
TOWEL GREEN STERILE FF (TOWEL DISPOSABLE) ×3 IMPLANT
TUBE CONNECTING 20'X1/4 (TUBING)
TUBE CONNECTING 20X1/4 (TUBING) IMPLANT
YANKAUER SUCT BULB TIP NO VENT (SUCTIONS) IMPLANT

## 2019-12-17 NOTE — Discharge Instructions (Signed)
Next dose of Tylenol can be given at 7:00pm if needed. Resume Eliquis in 48 hours   Post Anesthesia Home Care Instructions  Activity: Get plenty of rest for the remainder of the day. A responsible individual must stay with you for 24 hours following the procedure.  For the next 24 hours, DO NOT: -Drive a car -Paediatric nurse -Drink alcoholic beverages -Take any medication unless instructed by your physician -Make any legal decisions or sign important papers.  Meals: Start with liquid foods such as gelatin or soup. Progress to regular foods as tolerated. Avoid greasy, spicy, heavy foods. If nausea and/or vomiting occur, drink only clear liquids until the nausea and/or vomiting subsides. Call your physician if vomiting continues.  Special Instructions/Symptoms: Your throat may feel dry or sore from the anesthesia or the breathing tube placed in your throat during surgery. If this causes discomfort, gargle with warm salt water. The discomfort should disappear within 24 hours.    Regional Anesthesia Blocks  1. Numbness or the inability to move the "blocked" extremity may last from 3-48 hours after placement. The length of time depends on the medication injected and your individual response to the medication. If the numbness is not going away after 48 hours, call your surgeon.  2. The extremity that is blocked will need to be protected until the numbness is gone and the  Strength has returned. Because you cannot feel it, you will need to take extra care to avoid injury. Because it may be weak, you may have difficulty moving it or using it. You may not know what position it is in without looking at it while the block is in effect.  3. For blocks in the legs and feet, returning to weight bearing and walking needs to be done carefully. You will need to wait until the numbness is entirely gone and the strength has returned. You should be able to move your leg and foot normally before you try  and bear weight or walk. You will need someone to be with you when you first try to ensure you do not fall and possibly risk injury.  4. Bruising and tenderness at the needle site are common side effects and will resolve in a few days.  5. Persistent numbness or new problems with movement should be communicated to the surgeon or the [facility] [phone] [place] [phone]).Information for Discharge Teaching: EXPAREL (bupivacaine liposome injectable suspension)   Your surgeon or anesthesiologist gave you EXPAREL(bupivacaine) to help control your pain after surgery.   EXPAREL is a local anesthetic that provides pain relief by numbing the tissue around the surgical site.  EXPAREL is designed to release pain medication over time and can control pain for up to 72 hours.  Depending on how you respond to EXPAREL, you may require less pain medication during your recovery.  Possible side effects:  Temporary loss of sensation or ability to move in the area where bupivacaine was injected.  Nausea, vomiting, constipation  Rarely, numbness and tingling in your mouth or lips, lightheadedness, or anxiety may occur.  Call your doctor right away if you think you may be experiencing any of these sensations, or if you have other questions regarding possible side effects.  Follow all other discharge instructions given to you by your surgeon or nurse. Eat a healthy diet and drink plenty of water or other fluids.  If you return to the hospital for any reason within 96 hours following the administration of EXPAREL,  it is important for health care providers to know that you have received this anesthetic. A teal colored band has been placed on your arm with the date, time and amount of EXPAREL you have received in order to alert and inform your health care providers. Please leave this armband in place for the full 96 hours following administration, and then you may remove the  band.Information for Discharge Teaching:        [facility] _______[facility]  UMBILICAL OR INGUINAL HERNIA REPAIR: POST OP INSTRUCTIONS  Always review your discharge instruction sheet given to you by the facility where your surgery was performed. IF YOU HAVE DISABILITY OR FAMILY LEAVE FORMS, YOU MUST BRING THEM TO THE OFFICE FOR PROCESSING.   DO NOT GIVE THEM TO YOUR DOCTOR.  1. A  prescription for pain medication may be given to you upon discharge.  Take your pain medication as prescribed, if needed.  If narcotic pain medicine is not needed, then you may take acetaminophen (Tylenol) or ibuprofen (Advil) as needed. 2. Take your usually prescribed medications unless otherwise directed. If you need a refill on your pain medication, please contact your pharmacy.  They will contact our office to request authorization. Prescriptions will not be filled after 5 pm or on week-ends. 3. You should follow a light diet the first 24 hours after arrival home, such as soup and crackers, etc.  Be sure to include lots of fluids daily.  Resume your normal diet the day after surgery. 4.Most patients will experience some swelling and bruising around the umbilicus or in the groin and scrotum.  Ice packs and reclining will help.  Swelling and bruising can take several days to resolve.  6. It is common to experience some constipation if taking pain medication after surgery.  Increasing fluid intake and taking a stool softener (such as Colace) will usually help or prevent this problem from occurring.  A mild laxative (Milk of Magnesia or Miralax) should be taken according to package directions if there are no bowel movements after 48 hours. 7. Unless discharge instructions indicate otherwise, you may remove your bandages 24-48 hours after surgery, and you may shower at that time.  You may have steri-strips (small skin tapes) in place directly over the incision.  These strips should be left on the skin for  7-10 days.  If your surgeon used skin glue on the incision, you may shower in 24 hours.  The glue will flake off over the next 2-3 weeks.  Any sutures or staples will be removed at the office during your follow-up visit. 8. ACTIVITIES:  You may resume regular (light) daily activities beginning the next day--such as daily self-care, walking, climbing stairs--gradually increasing activities as tolerated.  You may have sexual intercourse when it is comfortable.  Refrain from any heavy lifting or straining until approved by your doctor.  a.You may drive when you are no longer taking prescription pain medication, you can comfortably wear a seatbelt, and you can safely maneuver your car and apply brakes. b.RETURN TO WORK:   _____________________________________________  9.You should see your doctor in the office for a follow-up appointment approximately 2-3 weeks after your surgery.  Make sure that you call for this appointment within a day or two after you arrive home to insure a convenient appointment time. 10.OTHER INSTRUCTIONS: _________________________    _____________________________________  WHEN TO CALL YOUR DOCTOR: 1. Fever over 101.0 2. Inability to urinate 3. Nausea and/or vomiting 4. Extreme swelling or bruising 5.  Continued bleeding from incision. 6. Increased pain, redness, or drainage from the incision  The clinic staff is available to answer your questions during regular business hours.  Please don't hesitate to call and ask to speak to one of the nurses for clinical concerns.  If you have a medical emergency, go to the nearest emergency room or call 911.  A surgeon from [facility] is always on call at the hospital   [address], [place], [place], [place]  [address] ?  [address] [place], [place]   [address] [phone] ? [phone] ? FAX [phone] Web site: [email or web address]

## 2019-12-17 NOTE — Interval H&P Note (Signed)
History and Physical Interval Note:  12/17/2019 1:23 PM  Robert Mclaughlin  has presented today for surgery, with the diagnosis of LEFT INGUINAL HERNIA.  The various methods of treatment have been discussed with the patient and family. After consideration of risks, benefits and other options for treatment, the patient has consented to  Procedure(s): HERNIA REPAIR LEFT INGUINAL ADULT (Left) as a surgical intervention.  The patient's history has been reviewed, patient examined, no change in status, stable for surgery.  I have reviewed the patient's chart and labs.  Questions were answered to the patient's satisfaction.     Turner Daniels MD

## 2019-12-17 NOTE — Anesthesia Postprocedure Evaluation (Signed)
Anesthesia Post Note  Patient: Robert Mclaughlin  Procedure(s) Performed: HERNIA REPAIR LEFT INGUINAL ADULT (Left Groin)     Patient location during evaluation: PACU Anesthesia Type: General Level of consciousness: awake and alert Pain management: pain level controlled Vital Signs Assessment: post-procedure vital signs reviewed and stable Respiratory status: spontaneous breathing, nonlabored ventilation and respiratory function stable Cardiovascular status: blood pressure returned to baseline and stable Postop Assessment: no apparent nausea or vomiting Anesthetic complications: no   No complications documented.  Last Vitals:  Vitals:   12/17/19 1600 12/17/19 1622  BP: 129/78 136/79  Pulse: 77 71  Resp: 15 16  Temp:  36.6 C  SpO2: 95% 98%    Last Pain:  Vitals:   12/17/19 1622  TempSrc: Oral  PainSc:                  [name]

## 2019-12-17 NOTE — Anesthesia Procedure Notes (Signed)
Anesthesia Regional Block: TAP block   Pre-Anesthetic Checklist: ,, timeout performed, Correct Patient, Correct Site, Correct Laterality, Correct Procedure, Correct Position, site marked, Risks and benefits discussed,  Surgical consent,  Pre-op evaluation,  At surgeon's request and post-op pain management  Laterality: Left  Prep: chloraprep       Needles:  Injection technique: Single-shot  Needle Type: Echogenic Needle     Needle Length: 10cm  Needle Gauge: 21     Additional Needles:   Narrative:  Start time: 12/17/2019 1:33 PM End time: 12/17/2019 1:36 PM Injection made incrementally with aspirations every 5 mL.  Performed by: Personally  Anesthesiologist: [name], MD  Additional Notes: No pain on injection. No increased resistance to injection. Injection made in 5cc increments. Good needle visualization. Patient tolerated the procedure well.

## 2019-12-17 NOTE — Anesthesia Preprocedure Evaluation (Addendum)
Anesthesia Evaluation  Patient identified by MRN, date of birth, ID band Patient awake    Reviewed: Allergy & Precautions, NPO status , Patient's Chart, lab work & pertinent test results  History of Anesthesia Complications Negative for: history of anesthetic complications  Airway Mallampati: II  TM Distance: >3 FB Neck ROM: Full    Dental  (+) Dental Advisory Given   Pulmonary former smoker,    Pulmonary exam normal        Cardiovascular + Peripheral Vascular Disease  Normal cardiovascular exam+ dysrhythmias Atrial Fibrillation + Valvular Problems/Murmurs MR    '21 Carotid US - 1-39% b/l ICAS  '19 TTE - EF 55% to 60%. Mild AI. Mild MV prolapse, involving the anterior leaflet with moderate MR. An Amplatzer closure device was present. There was no atrial level shunt.     Neuro/Psych  Headaches, CVA, No Residual Symptoms negative psych ROS   GI/Hepatic negative GI ROS, (+)     substance abuse  alcohol use,   Endo/Other   K 5.3   Renal/GU negative Renal ROS     Musculoskeletal  (+) Arthritis ,   Abdominal   Peds  Hematology  On eliquis    Anesthesia Other Findings Covid test negative   Reproductive/Obstetrics                            Anesthesia Physical Anesthesia Plan  ASA: III  Anesthesia Plan: General   Post-op Pain Management:  Regional for Post-op pain   Induction: Intravenous  PONV Risk Score and Plan: 2 and Treatment may vary due to age or medical condition, Ondansetron and Dexamethasone  Airway Management Planned: LMA  Additional Equipment: None  Intra-op Plan:   Post-operative Plan: Extubation in OR  Informed Consent: I have reviewed the patients History and Physical, chart, labs and discussed the procedure including the risks, benefits and alternatives for the proposed anesthesia with the patient or authorized representative who has indicated his/her  understanding and acceptance.     Dental advisory given  Plan Discussed with: CRNA and Anesthesiologist  Anesthesia Plan Comments:        Anesthesia Quick Evaluation

## 2019-12-17 NOTE — Progress Notes (Signed)
Assisted Dr. Fransisco Beau with left, ultrasound guided, transabdominal plane block. Side rails up, monitors on throughout procedure. See vital signs in flow sheet. Tolerated Procedure well.

## 2019-12-17 NOTE — Anesthesia Procedure Notes (Signed)
Procedure Name: Intubation Date/Time: 12/17/2019 2:05 PM Performed by: Maryella Shivers, CRNA Pre-anesthesia Checklist: Patient identified, Emergency Drugs available, Suction available and Patient being monitored Patient Re-evaluated:Patient Re-evaluated prior to induction Oxygen Delivery Method: Circle system utilized Preoxygenation: Pre-oxygenation with 100% oxygen Induction Type: IV induction Ventilation: Mask ventilation without difficulty Laryngoscope Size: Mac and 4 Grade View: Grade II Tube type: Oral Tube size: 8.0 mm Number of attempts: 1 Airway Equipment and Method: Stylet and Oral airway Placement Confirmation: ETT inserted through vocal cords under direct vision,  positive ETCO2 and breath sounds checked- equal and bilateral Secured at: 22 cm Tube secured with: Tape Dental Injury: Teeth and Oropharynx as per pre-operative assessment

## 2019-12-17 NOTE — Transfer of Care (Signed)
Immediate Anesthesia Transfer of Care Note  Patient: Robert Mclaughlin  Procedure(s) Performed: HERNIA REPAIR LEFT INGUINAL ADULT (Left Groin)  Patient Location: PACU  Anesthesia Type:GA combined with regional for post-op pain  Level of Consciousness: sedated  Airway & Oxygen Therapy: Patient Spontanous Breathing and Patient connected to nasal cannula oxygen  Post-op Assessment: Report given to RN and Post -op Vital signs reviewed and stable  Post vital signs: Reviewed and stable  Last Vitals:  Vitals Value Taken Time  BP    Temp    Pulse    Resp    SpO2      Last Pain:  Vitals:   12/17/19 1242  TempSrc: Oral  PainSc: 0-No pain      Patients Stated Pain Goal: 6 (96/72/27 7375)  Complications: No complications documented.

## 2019-12-17 NOTE — Op Note (Addendum)
Robert Mclaughlin [birth year] [id number] 12/17/2019  Preoperative diagnosis: Reducible left inguinal hernia  Postoperative diagnosis: Same  Procedure: Repair of left inguinal hernia with ultra Pro mesh  Surgeon: Erroll Luna, MD, FACS  Assistant: Dr Eliot Ford MD   Anesthesia:General and Regional  Clinical History and Indications: Patient presents for repair of symptomatic left inguinal hernia.  He has a history of a previous right inguinal hernia repair.  We discussed laparoscopic versus open techniques.  He agreed for open repair of left inguinal hernia with mesh.  Risks and benefits were discussed of surgery.  Nonoperative management discussed as well.  He was having significant pain therefore desired repair.The risk of hernia repair include bleeding,  Infection,   Recurrence of the hernia,  Mesh use, chronic pain,  Organ injury,  Bowel injury,  Bladder injury,   nerve injury with numbness around the incision,blood vessel injury, testicle pain/ loss ,   Death,  and worsening of preexisting  medical problems.  The alternatives to surgery have been discussed as well..  Long term expectations of both operative and non operative treatments have been discussed.   The patient agrees to proceed.  Description of Procedure:The patient was seen in the holding area and the plans for the procedure as noted above confirmed with the patient. We reviewed again the risks and complications and the patient had no further questions. I then marked the left  as the operative side. This was confirmed with the patient. He wishes to proceed.  The patient was taken to the operating room and after satisfactory general anesthesia was obtained the left  inguinal area was prepped and draped as a sterile field. A time out was done.  I used an equal mixture of 0.5% plain Marcaine and 1% Xylocaine with epinephrine for local anesthesia and to help with postoperative pain management. The area of the incision was  infiltrated first as well as a field block going medially and inferiorly. I also injected some below the external oblique aponeurosis at the level of the anterior superior iliac spine.  An oblique incision was made and deepened to the external oblique aponeurosis. Bleeders were either cauterized or tied with 3-0 Vicryl. The external oblique aponeurosis was opened in the line of its fibers and elevated off of the underlying tissue. The ilioinguinal nerve was noted and protected.  The spermatic cord was then dissected up off of the inguinal floor and surrounded with a drain.  There is a small indirect sac that was dissected off the cord and placed in the preperitoneal space.  He had significant weakening of the floor the inguinal canal.  This is where most of his bulging was coming from.  I then took some Ultrapro 3 inch x 6 inch  mesh and cut it to keyhole shape. It was anchored at the pubic tubercle and interrupted  1-0 Prolene used to suture it to the edge of the inferior shelving edge of the external oblique. It was split laterally to go around the cord and then laid gently over the internal oblique medially. Several more sutures of 1-0 Prolene were used to anchor the mesh to the internal oblique. The tails of the mesh were crossed lateral to the cord structures and deep ring and tacked together.  Hemostasis achieved with cautery and suture ligation as needed.  This appeared to produce a nice coverage and repair with no tension. There was adequate space for the cord structures to exit through the mesh and deep ring.  I checked  to make sure everything was dry. Additional local had been infiltrated as I was working to be sure we had complete anesthesia of the entire operative field. Ilioinguinal nerve divided as it exited the muscle to prevent attachment.   The incision was then closed with a running 2-0 Vicryl on the external oblique, closing it over the repair. Scarpa's fascia was closed with a running  3-0 Vicryl and the skin with a running 4-0 Monocryl subcuticular and Dermabond on the skin.  The patient tolerated the procedure well. There were no operative complications. There was minimal blood loss. All counts were correct.   I was personally present during the key and critical portions of this procedure and immediately available throughout the entire procedure, as documented in my operative note.  Erroll Luna, MD, FACS 12/17/2019 3:11 PM

## 2019-12-18 ENCOUNTER — Encounter (HOSPITAL_BASED_OUTPATIENT_CLINIC_OR_DEPARTMENT_OTHER): Payer: Self-pay | Admitting: Surgery

## 2019-12-23 DIAGNOSIS — N401 Enlarged prostate with lower urinary tract symptoms: Secondary | ICD-10-CM | POA: Diagnosis not present

## 2019-12-23 DIAGNOSIS — I4891 Unspecified atrial fibrillation: Secondary | ICD-10-CM | POA: Diagnosis not present

## 2019-12-23 DIAGNOSIS — E7800 Pure hypercholesterolemia, unspecified: Secondary | ICD-10-CM | POA: Diagnosis not present

## 2019-12-31 DIAGNOSIS — Z23 Encounter for immunization: Secondary | ICD-10-CM | POA: Diagnosis not present

## 2020-01-10 ENCOUNTER — Ambulatory Visit: Payer: Medicare Other

## 2020-01-23 DIAGNOSIS — R972 Elevated prostate specific antigen [PSA]: Secondary | ICD-10-CM | POA: Diagnosis not present

## 2020-01-30 DIAGNOSIS — R351 Nocturia: Secondary | ICD-10-CM | POA: Diagnosis not present

## 2020-01-30 DIAGNOSIS — R972 Elevated prostate specific antigen [PSA]: Secondary | ICD-10-CM | POA: Diagnosis not present

## 2020-01-30 DIAGNOSIS — N401 Enlarged prostate with lower urinary tract symptoms: Secondary | ICD-10-CM | POA: Diagnosis not present

## 2020-01-30 DIAGNOSIS — R3121 Asymptomatic microscopic hematuria: Secondary | ICD-10-CM | POA: Diagnosis not present

## 2020-03-15 IMAGING — CT CT ANGIO NECK
1 of 12 series · 5 of 33 positions shown · IV contrast (iopamidol)
Comparison: 07/12/2017 MRI head.  04/18/2006 MRA of head and neck.

CLINICAL DATA: 72 y/o  M; stroke for follow-up.

EXAM:
CT ANGIOGRAPHY HEAD AND NECK
TECHNIQUE: Multidetector CT imaging of the head and neck was performed using
the standard protocol during bolus administration of intravenous
contrast. Multiplanar CT image reconstructions and MIPs were
obtained to evaluate the vascular anatomy. Carotid stenosis
measurements (when applicable) are obtained utilizing NASCET
criteria, using the distal internal carotid diameter as the
denominator.
CONTRAST:  50mL [id number] IOPAMIDOL ([id number]-6HY) INJECTION 76%

[Series 12: ax thins · axial · 0.39mm/px · z∈[-337,-82]mm · 5 of 383 slices shown]
[im 64/383  soft-tissue]
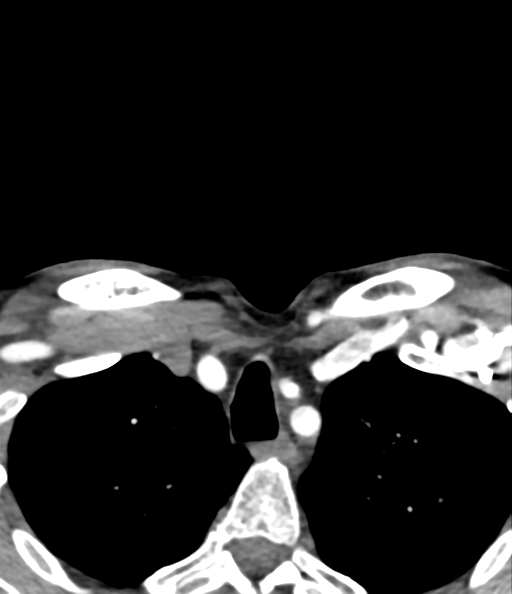
[im 128/383  bone]
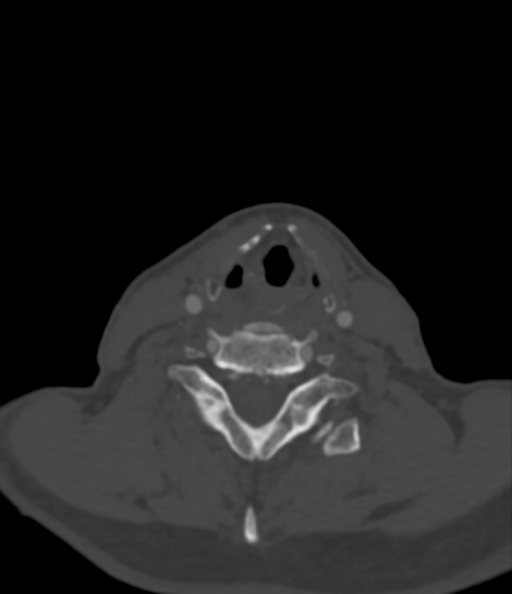
[im 192/383  soft-tissue]
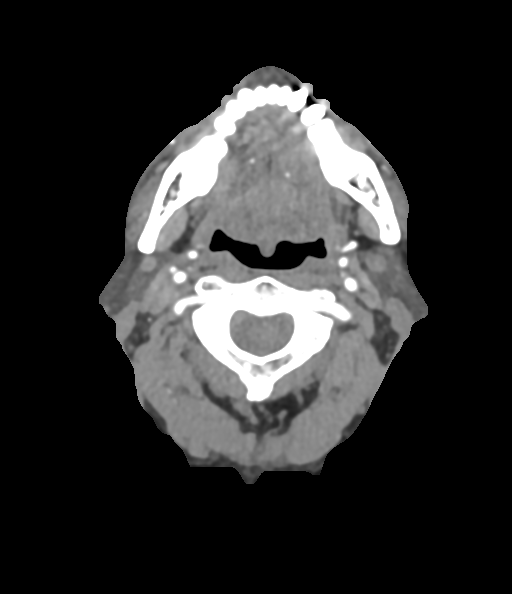
[im 255/383  bone]
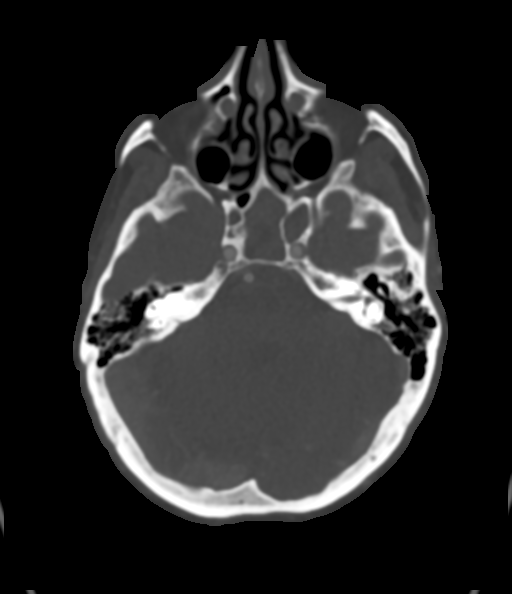
[im 319/383  soft-tissue]
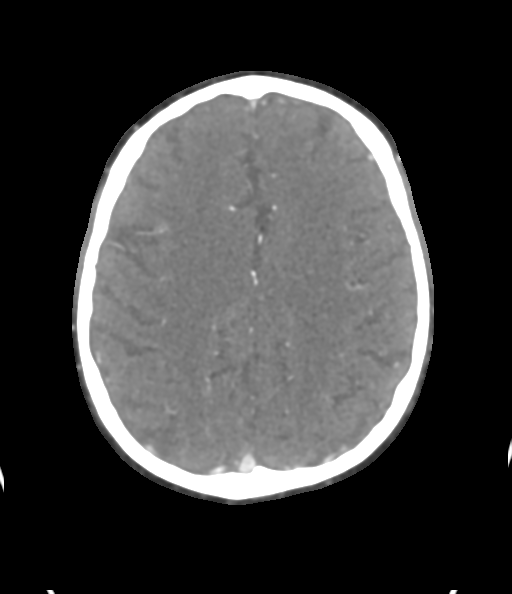

[5 of 33 positions shown; findings below may reference images not displayed]

FINDINGS: CT HEAD FINDINGS

Brain: Small acute infarct within left paramedian superior
cerebellum again seen. Infarct in left parietal lobe poorly
visualized on CT. Small chronic infarct in left posterior inferior
temporal lobe. Small chronic lacunar infarcts in left cerebellum.
Punctate density in right inferior cerebellum (series 7, image 18).
Otherwise no new acute stroke, hemorrhage, or focal mass effect.

Vascular: As below.

Skull: Normal. Negative for fracture or focal lesion.

Sinuses: Opacification of the sphenoid sinus.  Otherwise negative.

Orbits: No acute finding.

Review of the MIP images confirms the above findings

CTA NECK FINDINGS

Aortic arch: Standard branching. Imaged portion shows no evidence of
aneurysm or dissection. No significant stenosis of the major arch
vessel origins.

Right carotid system: No evidence of dissection, stenosis (50% or
greater) or occlusion.

Left carotid system: No evidence of dissection, stenosis (50% or
greater) or occlusion. Mild calcific atherosclerosis of left carotid
bifurcation with minimal less than 30% proximal ICA stenosis.

Vertebral arteries: Left dominant. No evidence of dissection,
stenosis (50% or greater) or occlusion.

Skeleton: Mild cervical spondylosis. No high-grade bony canal
stenosis.

Other neck: Negative.

Upper chest: Negative.

Review of the MIP images confirms the above findings

CTA HEAD FINDINGS

Anterior circulation: No significant stenosis, proximal occlusion,
aneurysm, or vascular malformation. Mild calcific atherosclerosis of
the carotid siphons without stenosis.

Posterior circulation: No significant stenosis, proximal occlusion,
aneurysm, or vascular malformation.

Venous sinuses: As permitted by contrast timing, patent.

Anatomic variants: Complete circle-of-Willis.

Delayed phase: No abnormal intracranial enhancement.

Review of the MIP images confirms the above findings
IMPRESSION: CT head:

1. Small acute infarct in left paramedian superior cerebellar
hemisphere again seen. Punctate left parietal infarct not visualized
on CT.
2. Punctate density in right inferior cerebellum without signal
abnormality on MR, possibly mineralization or petechial hemorrhage.
3. Otherwise no acute infarct, hemorrhage, or mass effect
identified.

CTA neck:

1. Patent carotid and vertebral arteries. No dissection, aneurysm,
or hemodynamically significant stenosis utilizing NASCET criteria.

CTA head:

1. Patent anterior and posterior intracranial circulation. No large
vessel occlusion, aneurysm, or significant stenosis.

These results will be called to the ordering clinician or
representative by the Radiologist Assistant, and communication
documented in the PACS or zVision Dashboard.

By: Tais Nomelin M.D.

## 2020-04-01 DIAGNOSIS — E7800 Pure hypercholesterolemia, unspecified: Secondary | ICD-10-CM | POA: Diagnosis not present

## 2020-04-01 DIAGNOSIS — N401 Enlarged prostate with lower urinary tract symptoms: Secondary | ICD-10-CM | POA: Diagnosis not present

## 2020-04-01 DIAGNOSIS — I4891 Unspecified atrial fibrillation: Secondary | ICD-10-CM | POA: Diagnosis not present

## 2020-04-14 NOTE — [place] Notes (Signed)
CARDIOLOGY OFFICE NOTE  Date:  04/28/2020    Robert Mclaughlin: 10/09/1945 Medical Record [id number]  PCP:  Robert Cruel, MD  Cardiologist:  Robert Mclaughlin & Robert Mclaughlin   Chief Complaint  Patient presents with  . Follow-up    Seen for Robert. Johnsie Mclaughlin and Robert. Lovena Mclaughlin    History of Present Illness: Robert Mclaughlin is a 75 y.o. male who presents today for a follow up visit. Seen for Robert. Lovena Mclaughlin and Robert Mclaughlin.   He has had prior PFO closure in January of 2018, former smoker, ?CVA in 2008 - treated with Plavix and then found to have PFO. No longer needs SBE.   Admitted 07/16/17 with CVA to the cerebellum and left parietal lobe with vertigo TTE 07/13/17 reviewed and showed normal EF 55% with mild AR/MR TEE showed normal EF with MVP anterior leaflet moderate MR mild AR PFO closure intact. No right to left shunt on bubble. Carotids with no severe stenosis Had ILR placed for cryptogenic stroke r/o PAF.   Last seen by Robert. Johnsie Mclaughlin by a virtual visit a year ago - felt to be doing ok. Had had some GU procedures by Robert. Gloriann Mclaughlin. Seen by Robert Mclaughlin earlier this week as well. EKG fine. Rare AF noted on his device. He is on anticoagulation. Device is nearing EOL - he was reflecting on explanting.    Comes in today. Here alone. He is doing well. No chest pain. Breathing is good. Not short of breath. Playing golf and walking the dog. Hopeful to get back to traveling out of the country later this year. Has had some intermittent eye issues - has seen his eye doctor. Had carotid doppler check last year that was stable. He feels like he is doing well. No problems with Eliquis - just had labs on Monday - these were good.   Past Medical History:  Diagnosis Date  . A-fib ([place])   . Arthritis    "left knee" (04/11/2016)  . Carotid artery occlusion   . Dysrhythmia    afib  . Hyperlipidemia   . Migraine equivalent    "none in years" (04/11/2016)  . PFO (patent foramen ovale)     dx 2008; closed 03/2016 by  Robert Mclaughlin  . Stroke [facility]) 2008   denies residual on 04/11/2016  . UTI (urinary tract infection)     Past Surgical History:  Procedure Laterality Date  . INGUINAL HERNIA REPAIR Right 08/15/2018   Procedure: RIGHT INGUINAL HERNIA REPAIR WITH MESH;  Surgeon: Erroll Luna, MD;  Location: [place];  Service: General;  Laterality: Right;  . INGUINAL HERNIA REPAIR Left 12/17/2019   Procedure: HERNIA REPAIR LEFT INGUINAL ADULT;  Surgeon: Erroll Luna, MD;  Location: [place];  Service: General;  Laterality: Left;  . JOINT REPLACEMENT    . LOOP RECORDER INSERTION N/A 07/16/2017   Procedure: LOOP RECORDER INSERTION;  Surgeon: Evans Lance, MD;  Location: [place] [facility];  Service: Cardiovascular;  Laterality: N/A;  . MASS EXCISION Right 2017   "precancerous growth on my leg"  . MOUTH SURGERY     tooth extraction and root extraction   . PATENT FORAMEN OVALE CLOSURE  04/11/2016  . PATENT FORAMEN OVALE CLOSURE N/A 04/11/2016   Procedure: Patent Forament Ovale(PFO) Closure;  Surgeon: Sherren Mocha, MD;  Location: [place] [facility];  Service: Cardiovascular;  Laterality: N/A;  . TEE WITHOUT CARDIOVERSION N/A 07/16/2017   Procedure: TRANSESOPHAGEAL ECHOCARDIOGRAM (TEE);  Surgeon:  Lelon Perla, MD;  Location: [facility];  Service: Cardiovascular;  Laterality: N/A;  . TONSILLECTOMY    . TOOTH EXTRACTION    . TOTAL KNEE ARTHROPLASTY Left 08/22/2016   Procedure: LEFT TOTAL KNEE ARTHROPLASTY;  Surgeon: Meredith Pel, MD;  Location: [place];  Service: Orthopedics;  Laterality: Left;     Medications: Current Meds  Medication Sig  . apixaban (ELIQUIS) 5 MG TABS tablet Take 1 tablet (5 mg total) by mouth 2 (two) times daily. Restart this medication in 48 hours  . ARTIFICIAL TEAR OP Place 1 drop into both eyes daily.   . diclofenac sodium (VOLTAREN) 1 % GEL APPLY 1 APPLICATION TOPICALLY 4 (FOUR) TIMES DAILY AS NEEDED (MUSCLE PAIN).  [name] Kitchen EPINEPHrine 0.3 mg/0.3  mL IJ SOAJ injection Inject 1 mL into the skin as needed for allergies.  [name] Kitchen ibuprofen (ADVIL) 800 MG tablet Take 1 tablet (800 mg total) by mouth every 8 (eight) hours as needed.  . Multiple Vitamin (MULTIVITAMIN) capsule Take 1 capsule by mouth daily.  . simvastatin (ZOCOR) 20 MG tablet Take 20 mg by mouth at bedtime.      Allergies: Allergies  Allergen Reactions  . Oxycodone     Rash/hives  . Shellfish Allergy   . Latanoprost Other (See Comments)    Ineffective   . Lumigan [Bimatoprost] Other (See Comments)    Ineffective    Social History: The patient  reports that he quit smoking about 51 years ago. He has a 6.00 pack-year smoking history. He has never used smokeless tobacco. He reports current alcohol use of about 14.0 standard drinks of alcohol per week. He reports that he does not use drugs.   Family History: The patient's family history includes Heart disease in his mother; Hyperlipidemia in his father; Varicose Veins in his mother.   Review of Systems: Please see the history of present illness.   All other systems are reviewed and negative.   Physical Exam: VS:  BP 138/74   Pulse 77   Ht 5\' 10"  (1.778 m)   Wt 183 lb 3.2 oz (83.1 kg)   SpO2 99%   BMI 26.29 kg/m  .  BMI Body mass index is 26.29 kg/m.  Wt Readings from Last 3 Encounters:  04/28/20 183 lb 3.2 oz (83.1 kg)  04/26/20 183 lb 9.6 oz (83.3 kg)  12/17/19 179 lb 3.7 oz (81.3 kg)    General: Pleasant. Well developed, well nourished and in no acute distress.   Neck: Supple, no JVD, carotid bruits, or masses noted.  Cardiac: Regular rate and rhythm. No murmurs, rubs, or gallops. No edema.  Respiratory:  Lungs are clear to auscultation bilaterally with normal work of breathing.  GI: Soft and nontender.  MS: No deformity or atrophy. Gait and ROM intact.  Skin: Warm and dry. Color is normal.  Neuro:  Strength and sensation are intact and no gross focal deficits noted.  Psych: Alert, appropriate and with  normal affect.   LABORATORY DATA:  EKG:  EKG is not ordered today.    Lab Results  Component Value Date   WBC 6.7 04/26/2020   HGB 13.9 04/26/2020   HCT 41.1 04/26/2020   PLT 177 04/26/2020   GLUCOSE 91 04/26/2020   CHOL 108 07/13/2017   TRIG 51 07/13/2017   HDL 46 07/13/2017   LDLCALC 52 07/13/2017   ALT 22 12/15/2019   AST 26 12/15/2019   NA 138 04/26/2020   K 4.5 04/26/2020   CL 104 04/26/2020  CREATININE 1.12 04/26/2020   BUN 16 04/26/2020   CO2 23 04/26/2020   INR 1.12 07/12/2017   HGBA1C 5.4 07/13/2017       BNP (last 3 results) No results for input(s): BNP in the last 8760 hours.  ProBNP (last 3 results) No results for input(s): PROBNP in the last 8760 hours.   Other Studies Reviewed Today:     ASSESSMENT AND PLAN:   1. Prior PFO closure - 2019 - only on aspirin - no longer needing SBE.   2. Prior CVA - recurrent after PFO closure - has had PAF - has ILR in place.   3. HLD - on statin  4. Rare PAF- on anticoagulation - no problems noted. Low burden reported.   5. Underlying ILR -nearing EOL.   6. HTN - BP is fine.    Current medicines are reviewed with the patient today.  The patient does not have concerns regarding medicines other than what has been noted above.  The following changes have been made:  See above.  Labs/ tests ordered today include:   No orders of the defined types were placed in this encounter.    Disposition:   FU with EP as planned. See Robert. Johnsie Mclaughlin in one year.    Patient is agreeable to this plan and will call if any problems develop in the interim.   SignedTruitt Merle, NP  04/28/2020 11:21 AM  [place] [address] [place], [place]  [address] Phone: [phone] Fax: [phone]

## 2020-04-23 DIAGNOSIS — R972 Elevated prostate specific antigen [PSA]: Secondary | ICD-10-CM | POA: Diagnosis not present

## 2020-04-26 ENCOUNTER — Other Ambulatory Visit: Payer: Self-pay

## 2020-04-26 ENCOUNTER — Ambulatory Visit (INDEPENDENT_AMBULATORY_CARE_PROVIDER_SITE_OTHER): Payer: Medicare Other | Admitting: Nurse Practitioner

## 2020-04-26 ENCOUNTER — Encounter: Payer: Self-pay | Admitting: Nurse Practitioner

## 2020-04-26 ENCOUNTER — Telehealth: Payer: Self-pay | Admitting: *Deleted

## 2020-04-26 VITALS — BP 122/66 | HR 67 | Ht 70.0 in | Wt 183.6 lb

## 2020-04-26 DIAGNOSIS — I480 Paroxysmal atrial fibrillation: Secondary | ICD-10-CM

## 2020-04-26 DIAGNOSIS — D696 Thrombocytopenia, unspecified: Secondary | ICD-10-CM | POA: Diagnosis not present

## 2020-04-26 LAB — CBC
Hematocrit: 41.1 % (ref 37.5–51.0)
Hemoglobin: 13.9 g/dL (ref 13.0–17.7)
MCH: 30.8 pg (ref 26.6–33.0)
MCHC: 33.8 g/dL (ref 31.5–35.7)
MCV: 91 fL (ref 79–97)
Platelets: 177 10*3/uL (ref 150–450)
RBC: 4.52 x10E6/uL (ref 4.14–5.80)
RDW: 12.5 % (ref 11.6–15.4)
WBC: 6.7 10*3/uL (ref 3.4–10.8)

## 2020-04-26 LAB — BASIC METABOLIC PANEL
BUN/Creatinine Ratio: 14 (ref 10–24)
BUN: 16 mg/dL (ref 8–27)
CO2: 23 mmol/L (ref 20–29)
Calcium: 8.8 mg/dL (ref 8.6–10.2)
Chloride: 104 mmol/L (ref 96–106)
Creatinine, Ser: 1.12 mg/dL (ref 0.76–1.27)
GFR calc Af Amer: 74 mL/min/{1.73_m2} (ref >59)
GFR calc non Af Amer: 64 mL/min/{1.73_m2} (ref >59)
Glucose: 91 mg/dL (ref 65–99)
Potassium: 4.5 mmol/L (ref 3.5–5.2)
Sodium: 138 mmol/L (ref 134–144)

## 2020-04-26 NOTE — Patient Instructions (Signed)
Medication Instructions:  Your physician recommends that you continue on your current medications as directed. Please refer to the Current Medication list given to you today.  *If you need a refill on your cardiac medications before your next appointment, please call your pharmacy*   Lab Work: Today: CBC, BMET  If you have labs (blood work) drawn today and your tests are completely normal, you will receive your results only by: [name] Kitchen MyChart Message (if you have MyChart) OR . A paper copy in the mail If you have any lab test that is abnormal or we need to change your treatment, we will call you to review the results.   Follow-Up: At [facility], you and your health needs are our priority.  As part of our continuing mission to provide you with exceptional heart care, we have created designated Provider Care Teams.  These Care Teams include your primary Cardiologist (physician) and Advanced Practice Providers (APPs -  Physician Assistants and Nurse Practitioners) who all work together to provide you with the care you need, when you need it.  Your next appointment:   1 year(s)  The format for your next appointment:   In Person  Provider:   Cristopher Peru, MD

## 2020-04-26 NOTE — Telephone Encounter (Signed)
   [facility] Pre-operative Risk Assessment    HEARTCARE STAFF: - Please ensure there is not already an duplicate clearance open for this procedure. - Under Visit Info/Reason for Call, type in Other and utilize the format Clearance MM/DD/YY or Clearance TBD. Do not use dashes or single digits. - If request is for dental extraction, please clarify the # of teeth to be extracted.  Request for surgical clearance:  1. What type of surgery is being performed? Extraction of a single tooth    2. When is this surgery scheduled? TBD   3. What type of clearance is required (medical clearance vs. Pharmacy clearance to hold med vs. Both)? BOth  4. Are there any medications that need to be held prior to surgery and how long?Eliquis   5. Practice name and name of physician performing surgery? [facility]   6. What is the office phone number? [phone]    7.   What is the office fax number? [phone]  8.   Anesthesia type (None, local, MAC, general) ? Not listed   Juventino Slovak 04/26/2020, 2:26 PM  _________________________________________________________________   (provider comments below)

## 2020-04-26 NOTE — [place] Notes (Signed)
Electrophysiology Office Note Date: 04/26/2020  ID:  Robert Mclaughlin, DOB 09/28/1945, MRN [id number]  PCP: Lawerance Cruel, MD Electrophysiologist: Lovena Le  CC: AF follow up  Robert Mclaughlin is a 75 y.o. male seen today for Dr Lovena Le.  He presents today for routine electrophysiology followup.  Since last being seen in our clinic, the patient reports doing very well.  He denies chest pain, palpitations, dyspnea, PND, orthopnea, nausea, vomiting, dizziness, syncope, edema, weight gain, or early satiety.  Past Medical History:  Diagnosis Date  . A-fib ([place])   . Arthritis    "left knee" (04/11/2016)  . Carotid artery occlusion   . Dysrhythmia    afib  . Hyperlipidemia   . Migraine equivalent    "none in years" (04/11/2016)  . PFO (patent foramen ovale)     dx 2008; closed 03/2016 by Dr Burt Knack  . Stroke [facility]) 2008   denies residual on 04/11/2016  . UTI (urinary tract infection)    Past Surgical History:  Procedure Laterality Date  . INGUINAL HERNIA REPAIR Right 08/15/2018   Procedure: RIGHT INGUINAL HERNIA REPAIR WITH MESH;  Surgeon: Erroll Luna, MD;  Location: [place];  Service: General;  Laterality: Right;  . INGUINAL HERNIA REPAIR Left 12/17/2019   Procedure: HERNIA REPAIR LEFT INGUINAL ADULT;  Surgeon: Erroll Luna, MD;  Location: [place];  Service: General;  Laterality: Left;  . JOINT REPLACEMENT    . LOOP RECORDER INSERTION N/A 07/16/2017   Procedure: LOOP RECORDER INSERTION;  Surgeon: Evans Lance, MD;  Location: [place] [facility];  Service: Cardiovascular;  Laterality: N/A;  . MASS EXCISION Right 2017   "precancerous growth on my leg"  . MOUTH SURGERY     tooth extraction and root extraction   . PATENT FORAMEN OVALE CLOSURE  04/11/2016  . PATENT FORAMEN OVALE CLOSURE N/A 04/11/2016   Procedure: Patent Forament Ovale(PFO) Closure;  Surgeon: Sherren Mocha, MD;  Location: [place] CV [facility];  Service: Cardiovascular;   Laterality: N/A;  . TEE WITHOUT CARDIOVERSION N/A 07/16/2017   Procedure: TRANSESOPHAGEAL ECHOCARDIOGRAM (TEE);  Surgeon: Lelon Perla, MD;  Location: [facility];  Service: Cardiovascular;  Laterality: N/A;  . TONSILLECTOMY    . TOOTH EXTRACTION    . TOTAL KNEE ARTHROPLASTY Left 08/22/2016   Procedure: LEFT TOTAL KNEE ARTHROPLASTY;  Surgeon: Meredith Pel, MD;  Location: [place];  Service: Orthopedics;  Laterality: Left;    Current Outpatient Medications  Medication Sig Dispense Refill  . apixaban (ELIQUIS) 5 MG TABS tablet Take 1 tablet (5 mg total) by mouth 2 (two) times daily. Restart this medication in 48 hours 60 tablet 5  . ARTIFICIAL TEAR OP Place 1 drop into both eyes daily.     . diclofenac sodium (VOLTAREN) 1 % GEL APPLY 1 APPLICATION TOPICALLY 4 (FOUR) TIMES DAILY AS NEEDED (MUSCLE PAIN). 100 g 3  . EPINEPHrine 0.3 mg/0.3 mL IJ SOAJ injection Inject 1 mL into the skin as needed for allergies.    [name] Kitchen ibuprofen (ADVIL) 800 MG tablet Take 1 tablet (800 mg total) by mouth every 8 (eight) hours as needed. 30 tablet 0  . Multiple Vitamin (MULTIVITAMIN) capsule Take 1 capsule by mouth daily.    . simvastatin (ZOCOR) 20 MG tablet Take 20 mg by mouth at bedtime.      No current facility-administered medications for this visit.    Allergies:   Oxycodone, Shellfish allergy, Latanoprost, and Lumigan [bimatoprost]   Social History: Social  History   Socioeconomic History  . Marital status: Married    Spouse name: Not on file  . Number of children: Not on file  . Years of education: Not on file  . Highest education level: Not on file  Occupational History  . Not on file  Tobacco Use  . Smoking status: Former Smoker    Packs/day: 1.00    Years: 6.00    Pack years: 6.00    Quit date: 03/13/1969    Years since quitting: 51.1  . Smokeless tobacco: Never Used  Vaping Use  . Vaping Use: Never used  Substance and Sexual Activity  . Alcohol use: Yes    Alcohol/week: 14.0  standard drinks    Types: 14 Cans of beer per week    Comment: 2 drinks per day  . Drug use: No  . Sexual activity: Yes  Other Topics Concern  . Not on file  Social History Narrative  . Not on file   Social Determinants of Health   Financial Resource Strain: Not on file  Food Insecurity: Not on file  Transportation Needs: Not on file  Physical Activity: Not on file  Stress: Not on file  Social Connections: Not on file  Intimate Partner Violence: Not on file    Family History: Family History  Problem Relation Age of Onset  . Heart disease Mother        Heart Disease before age 76  . Varicose Veins Mother   . Hyperlipidemia Father     Review of Systems: All other systems reviewed and are otherwise negative except as noted above.   Physical Exam: VS:  BP 122/66   Pulse 67   Ht 5\' 10"  (1.778 m)   Wt 183 lb 9.6 oz (83.3 kg)   SpO2 96%   BMI 26.34 kg/m  , BMI Body mass index is 26.34 kg/m. Wt Readings from Last 3 Encounters:  04/26/20 183 lb 9.6 oz (83.3 kg)  12/17/19 179 lb 3.7 oz (81.3 kg)  09/24/19 185 lb 12.8 oz (84.3 kg)    GEN- The patient is well appearing, alert and oriented x 3 today.   HEENT: normocephalic, atraumatic; sclera clear, conjunctiva pink; hearing intact; oropharynx clear; neck supple  Lungs- Clear to ausculation bilaterally, normal work of breathing.  No wheezes, rales, rhonchi Heart- Regular rate and rhythm  GI- soft, non-tender, non-distended, bowel sounds present  Extremities- no clubbing, cyanosis, or edema  MS- no significant deformity or atrophy Skin- warm and dry, no rash or lesion  Psych- euthymic mood, full affect Neuro- strength and sensation are intact   EKG:  EKG is ordered today. EKG today shows SR, rate 67  Recent Labs: 12/15/2019: ALT 22; BUN 19; Creatinine, Ser 1.19; Hemoglobin 14.3; Platelets 188; Potassium 5.3; Sodium 137    Other studies Reviewed: Additional studies/ records that were reviewed today include: Dr  Tanna Furry office notes  Assessment and Plan:  1.  Paroxysmal atrial fibrillation Burden low by symptoms Continue Eliquis for CHADS2VASC of 4 BMET, CBC today  ILR at EOS - d/w patient, will leave in place for now    Current medicines are reviewed at length with the patient today.   The patient does not have concerns regarding his medicines.  The following changes were made today:  none  Labs/ tests ordered today include:   Orders Placed This Encounter  Procedures  . Basic metabolic panel  . CBC  . EKG 12-Lead     Disposition:   Follow up  with Dr Lovena Le 1 year     Signed, Chanetta Marshall, NP 04/26/2020 10:19 AM   [facility] [address] [place] [address] [phone] (office) [phone] (fax)

## 2020-04-27 NOTE — Telephone Encounter (Signed)
Notes faxed to surgeon. This phone note will be removed from the preop pool. Richardson Dopp, PA-C  04/27/2020 2:00 PM

## 2020-04-27 NOTE — Telephone Encounter (Signed)
   Primary Cardiologist: Jenkins Rouge, MD  Chart reviewed as part of pre-operative protocol coverage.   Simple dental extractions are considered low risk procedures per guidelines and generally do not require any specific cardiac clearance. It is also generally accepted that for simple extractions and dental cleanings, there is no need to interrupt blood thinner therapy.  Recommendations:  Continue Apixaban (Eliquis) without interruption.   SBE prophylaxis is not required for the patient from a cardiac standpoint.  Please call with questions. Richardson Dopp, PA-C 04/27/2020, 1:55 PM

## 2020-04-28 ENCOUNTER — Ambulatory Visit (INDEPENDENT_AMBULATORY_CARE_PROVIDER_SITE_OTHER): Payer: Medicare Other | Admitting: Nurse Practitioner

## 2020-04-28 ENCOUNTER — Other Ambulatory Visit: Payer: Self-pay

## 2020-04-28 ENCOUNTER — Encounter: Payer: Self-pay | Admitting: Nurse Practitioner

## 2020-04-28 VITALS — BP 138/74 | HR 77 | Ht 70.0 in | Wt 183.2 lb

## 2020-04-28 DIAGNOSIS — I480 Paroxysmal atrial fibrillation: Secondary | ICD-10-CM

## 2020-04-28 DIAGNOSIS — Q2112 Patent foramen ovale: Secondary | ICD-10-CM

## 2020-04-28 DIAGNOSIS — E782 Mixed hyperlipidemia: Secondary | ICD-10-CM

## 2020-04-28 DIAGNOSIS — Q211 Atrial septal defect: Secondary | ICD-10-CM

## 2020-04-28 NOTE — Patient Instructions (Addendum)
After Visit Summary:  We will be checking the following labs today - NONE   Medication Instructions:    Continue with your current medicines.    If you need a refill on your cardiac medications before your next appointment, please call your pharmacy.     Testing/Procedures To Be Arranged:  N/A  Follow-Up:   See EP as planned.   See Dr. Johnsie Cancel in 12 months.  You will receive a reminder letter in the mail two months in advance. If you don't receive a letter, please call our office to schedule the follow-up appointment.     At [facility], you and your health needs are our priority.  As part of our continuing mission to provide you with exceptional heart care, we have created designated Provider Care Teams.  These Care Teams include your primary Cardiologist (physician) and Advanced Practice Providers (APPs -  Physician Assistants and Nurse Practitioners) who all work together to provide you with the care you need, when you need it.  Special Instructions:  . Stay safe, wash your hands for at least 20 seconds and wear a mask when needed.  . It was good to talk with you today.  [name] Kitchen Keep up the good work.    Call the [place] office at [phone] if you have any questions, problems or concerns.

## 2020-04-30 DIAGNOSIS — R972 Elevated prostate specific antigen [PSA]: Secondary | ICD-10-CM | POA: Diagnosis not present

## 2020-04-30 DIAGNOSIS — N400 Enlarged prostate without lower urinary tract symptoms: Secondary | ICD-10-CM | POA: Diagnosis not present

## 2020-04-30 DIAGNOSIS — N5201 Erectile dysfunction due to arterial insufficiency: Secondary | ICD-10-CM | POA: Diagnosis not present

## 2020-05-07 DIAGNOSIS — H401131 Primary open-angle glaucoma, bilateral, mild stage: Secondary | ICD-10-CM | POA: Diagnosis not present

## 2020-05-07 DIAGNOSIS — H2513 Age-related nuclear cataract, bilateral: Secondary | ICD-10-CM | POA: Diagnosis not present

## 2020-05-31 DIAGNOSIS — N401 Enlarged prostate with lower urinary tract symptoms: Secondary | ICD-10-CM | POA: Diagnosis not present

## 2020-05-31 DIAGNOSIS — I4891 Unspecified atrial fibrillation: Secondary | ICD-10-CM | POA: Diagnosis not present

## 2020-05-31 DIAGNOSIS — E7800 Pure hypercholesterolemia, unspecified: Secondary | ICD-10-CM | POA: Diagnosis not present

## 2020-07-23 DIAGNOSIS — R972 Elevated prostate specific antigen [PSA]: Secondary | ICD-10-CM | POA: Diagnosis not present

## 2020-07-28 ENCOUNTER — Other Ambulatory Visit: Payer: Self-pay | Admitting: Internal Medicine

## 2020-07-28 NOTE — Telephone Encounter (Signed)
19m, 83.1kg, scr 1.12 04/26/20, lovw/gerhardt 04/28/20

## 2020-07-30 DIAGNOSIS — C44519 Basal cell carcinoma of skin of other part of trunk: Secondary | ICD-10-CM | POA: Diagnosis not present

## 2020-07-30 DIAGNOSIS — R972 Elevated prostate specific antigen [PSA]: Secondary | ICD-10-CM | POA: Diagnosis not present

## 2020-07-30 DIAGNOSIS — Z1283 Encounter for screening for malignant neoplasm of skin: Secondary | ICD-10-CM | POA: Diagnosis not present

## 2020-07-30 DIAGNOSIS — L304 Erythema intertrigo: Secondary | ICD-10-CM | POA: Diagnosis not present

## 2020-07-30 DIAGNOSIS — N400 Enlarged prostate without lower urinary tract symptoms: Secondary | ICD-10-CM | POA: Diagnosis not present

## 2020-07-30 DIAGNOSIS — D225 Melanocytic nevi of trunk: Secondary | ICD-10-CM | POA: Diagnosis not present

## 2020-09-17 DIAGNOSIS — Z85828 Personal history of other malignant neoplasm of skin: Secondary | ICD-10-CM | POA: Diagnosis not present

## 2020-09-17 DIAGNOSIS — Z08 Encounter for follow-up examination after completed treatment for malignant neoplasm: Secondary | ICD-10-CM | POA: Diagnosis not present

## 2020-11-03 DIAGNOSIS — Z23 Encounter for immunization: Secondary | ICD-10-CM | POA: Diagnosis not present

## 2020-11-05 DIAGNOSIS — H401131 Primary open-angle glaucoma, bilateral, mild stage: Secondary | ICD-10-CM | POA: Diagnosis not present

## 2020-11-05 DIAGNOSIS — H5213 Myopia, bilateral: Secondary | ICD-10-CM | POA: Diagnosis not present

## 2020-11-05 DIAGNOSIS — H2513 Age-related nuclear cataract, bilateral: Secondary | ICD-10-CM | POA: Diagnosis not present

## 2020-12-02 DIAGNOSIS — Z20822 Contact with and (suspected) exposure to covid-19: Secondary | ICD-10-CM | POA: Diagnosis not present

## 2020-12-06 DIAGNOSIS — Z79899 Other long term (current) drug therapy: Secondary | ICD-10-CM | POA: Diagnosis not present

## 2020-12-06 DIAGNOSIS — Z0000 Encounter for general adult medical examination without abnormal findings: Secondary | ICD-10-CM | POA: Diagnosis not present

## 2020-12-06 DIAGNOSIS — R251 Tremor, unspecified: Secondary | ICD-10-CM | POA: Diagnosis not present

## 2020-12-06 DIAGNOSIS — D6869 Other thrombophilia: Secondary | ICD-10-CM | POA: Diagnosis not present

## 2020-12-06 DIAGNOSIS — E7800 Pure hypercholesterolemia, unspecified: Secondary | ICD-10-CM | POA: Diagnosis not present

## 2021-01-26 ENCOUNTER — Other Ambulatory Visit: Payer: Self-pay

## 2021-01-26 MED ORDER — APIXABAN 5 MG PO TABS
5.0000 mg | ORAL_TABLET | Freq: Two times a day (BID) | ORAL | 1 refills | Status: DC
Start: 2021-01-26 — End: 2021-07-21

## 2021-01-26 NOTE — Telephone Encounter (Signed)
Prescription refill request for Eliquis received. Indication:Afib  Last office visit:04/28/20 Servando Snare)  Scr:1.12 (04/26/20)  Age: 75 Weight: 83.1kg  Appropriate dose and refill sent to requested pharmacy.

## 2021-04-15 DIAGNOSIS — R208 Other disturbances of skin sensation: Secondary | ICD-10-CM | POA: Diagnosis not present

## 2021-04-15 DIAGNOSIS — Z08 Encounter for follow-up examination after completed treatment for malignant neoplasm: Secondary | ICD-10-CM | POA: Diagnosis not present

## 2021-04-15 DIAGNOSIS — Z85828 Personal history of other malignant neoplasm of skin: Secondary | ICD-10-CM | POA: Diagnosis not present

## 2021-05-09 DIAGNOSIS — H401131 Primary open-angle glaucoma, bilateral, mild stage: Secondary | ICD-10-CM | POA: Diagnosis not present

## 2021-05-13 ENCOUNTER — Encounter: Payer: Self-pay | Admitting: Internal Medicine

## 2021-05-13 ENCOUNTER — Ambulatory Visit (INDEPENDENT_AMBULATORY_CARE_PROVIDER_SITE_OTHER): Payer: Medicare Other | Admitting: Internal Medicine

## 2021-05-13 ENCOUNTER — Other Ambulatory Visit: Payer: Self-pay

## 2021-05-13 VITALS — BP 106/60 | HR 70 | Ht 70.0 in | Wt 184.0 lb

## 2021-05-13 DIAGNOSIS — I639 Cerebral infarction, unspecified: Secondary | ICD-10-CM

## 2021-05-13 DIAGNOSIS — I480 Paroxysmal atrial fibrillation: Secondary | ICD-10-CM | POA: Diagnosis not present

## 2021-05-13 NOTE — Patient Instructions (Addendum)
Medication Instructions:  ?Your physician recommends that you continue on your current medications as directed. Please refer to the Current Medication list given to you today. ? ?Labwork: ?None ordered. ? ?Testing/Procedures: ?None ordered. ? ?Follow-Up: ?Your physician wants you to follow-up in: one year with Cristopher Peru, MD ?You will receive a reminder letter in the mail two months in advance. If you don't receive a letter, please call our office to schedule the follow-up appointment. ? ?Any Other Special Instructions Will Be Listed Below (If Applicable). ? ?If you need a refill on your cardiac medications before your next appointment, please call your pharmacy.  ? ? ? ? ?

## 2021-05-13 NOTE — Progress Notes (Signed)
? ? ? ? ?HPI ?[name] returns today for followup of his ILR and PAF and cryptogenic stroke. He is a pleasant 76 yo man with the above who underwent ILR insertion and was then found to have PAF. He has done well in the interim. He denies chest pain, palpitations or sob. He has brief episodes of atrial fib. No bleeding on systemic anti-coagulation. His ILR is approaching ERI but he is not interested in removal. "I have a lot of stuff in me." ?Allergies  ?Allergen Reactions  ? Oxycodone   ?  Rash/hives  ? Shellfish Allergy   ? Latanoprost Other (See Comments)  ?  Ineffective   ? Lumigan [Bimatoprost] Other (See Comments)  ?  Ineffective  ? ? ? ?Current Outpatient Medications  ?Medication Sig Dispense Refill  ? apixaban (ELIQUIS) 5 MG TABS tablet Take 1 tablet (5 mg total) by mouth 2 (two) times daily. 180 tablet 1  ? ARTIFICIAL TEAR OP Place 1 drop into both eyes daily.     ? diclofenac sodium (VOLTAREN) 1 % GEL APPLY 1 APPLICATION TOPICALLY 4 (FOUR) TIMES DAILY AS NEEDED (MUSCLE PAIN). 100 g 3  ? EPINEPHrine 0.3 mg/0.3 mL IJ SOAJ injection Inject 1 mL into the skin as needed for allergies.    ? ibuprofen (ADVIL) 800 MG tablet Take 1 tablet (800 mg total) by mouth every 8 (eight) hours as needed. 30 tablet 0  ? Multiple Vitamin (MULTIVITAMIN) capsule Take 1 capsule by mouth daily.    ? simvastatin (ZOCOR) 20 MG tablet Take 20 mg by mouth at bedtime.     ? ?No current facility-administered medications for this visit.  ? ? ? ?Past Medical History:  ?Diagnosis Date  ? A-fib ([place])   ? Arthritis   ? "left knee" (04/11/2016)  ? Carotid artery occlusion   ? Dysrhythmia   ? afib  ? Hyperlipidemia   ? Migraine equivalent   ? "none in years" (04/11/2016)  ? PFO (patent foramen ovale)   ?  dx 2008; closed 03/2016 by Dr Burt Knack  ? Stroke [facility]) 2008  ? denies residual on 04/11/2016  ? UTI (urinary tract infection)   ? ? ?ROS: ? ? All systems reviewed and negative except as noted in the HPI. ? ? ?Past Surgical History:   ?Procedure Laterality Date  ? INGUINAL HERNIA REPAIR Right 08/15/2018  ? Procedure: RIGHT INGUINAL HERNIA REPAIR WITH MESH;  Surgeon: Erroll Luna, MD;  Location: [place];  Service: General;  Laterality: Right;  ? INGUINAL HERNIA REPAIR Left 12/17/2019  ? Procedure: HERNIA REPAIR LEFT INGUINAL ADULT;  Surgeon: Erroll Luna, MD;  Location: [place];  Service: General;  Laterality: Left;  ? JOINT REPLACEMENT    ? LOOP RECORDER INSERTION N/A 07/16/2017  ? Procedure: LOOP RECORDER INSERTION;  Surgeon: Evans Lance, MD;  Location: [place] CV LAB;  Service: Cardiovascular;  Laterality: N/A;  ? MASS EXCISION Right 2017  ? "precancerous growth on my leg"  ? MOUTH SURGERY    ? tooth extraction and root extraction   ? PATENT FORAMEN OVALE CLOSURE  04/11/2016  ? PATENT FORAMEN OVALE CLOSURE N/A 04/11/2016  ? Procedure: Patent Forament [place](PFO) Closure;  Surgeon: Sherren Mocha, MD;  Location: [place] CV LAB;  Service: Cardiovascular;  Laterality: N/A;  ? TEE WITHOUT CARDIOVERSION N/A 07/16/2017  ? Procedure: TRANSESOPHAGEAL ECHOCARDIOGRAM (TEE);  Surgeon: Lelon Perla, MD;  Location: [facility] ENDOSCOPY;  Service: Cardiovascular;  Laterality: N/A;  ? TONSILLECTOMY    ?  TOOTH EXTRACTION    ? TOTAL KNEE ARTHROPLASTY Left 08/22/2016  ? Procedure: LEFT TOTAL KNEE ARTHROPLASTY;  Surgeon: Meredith Pel, MD;  Location: [place];  Service: Orthopedics;  Laterality: Left;  ? ? ? ?Family History  ?Problem Relation Age of Onset  ? Heart disease Mother   ?     Heart Disease before age 41  ? Varicose Veins Mother   ? Hyperlipidemia Father   ? ? ? ?Social History  ? ?Socioeconomic History  ? Marital status: Married  ?  Spouse name: Not on file  ? Number of children: Not on file  ? Years of education: Not on file  ? Highest education level: Not on file  ?Occupational History  ? Not on file  ?Tobacco Use  ? Smoking status: Former  ?  Packs/day: 1.00  ?  Years: 6.00  ?  Pack years: 6.00  ?  Types:  Cigarettes  ?  Quit date: 03/13/1969  ?  Years since quitting: 52.2  ? Smokeless tobacco: Never  ?Vaping Use  ? Vaping Use: Never used  ?Substance and Sexual Activity  ? Alcohol use: Yes  ?  Alcohol/week: 14.0 standard drinks  ?  Types: 14 Cans of beer per week  ?  Comment: 2 drinks per day  ? Drug use: No  ? Sexual activity: Yes  ?Other Topics Concern  ? Not on file  ?Social History Narrative  ? Not on file  ? ?Social Determinants of Health  ? ?Financial Resource Strain: Not on file  ?Food Insecurity: Not on file  ?Transportation Needs: Not on file  ?Physical Activity: Not on file  ?Stress: Not on file  ?Social Connections: Not on file  ?Intimate Partner Violence: Not on file  ? ? ? ?BP 106/60   Pulse 70   Ht 5\' 10"  (1.778 m)   Wt 184 lb (83.5 kg)   SpO2 97%   BMI 26.40 kg/m?  ? ?Physical Exam: ? ?Well appearing NAD ?HEENT: Unremarkable ?Neck:  No JVD, no thyromegally ?Lymphatics:  No adenopathy ?Back:  No CVA tenderness ?Lungs:  Clear with no wheezes ?HEART:  Regular rate rhythm, no murmurs, no rubs, no clicks ?Abd:  soft, positive bowel sounds, no organomegally, no rebound, no guarding ?Ext:  2 plus pulses, no edema, no cyanosis, no clubbing ?Skin:  No rashes no nodules ?Neuro:  CN II through XII intact, motor grossly intact ? ?EKG - NSR with PVC's ? ? ?Assess/Plan:  ?1. PAF - he is asymptomatic. He will continue systemic anti-coagulation.  ?2. HTN - his bp is controlled.  ?3. ILR - his device is at end of service. He has decided not to have it removed. ?  ?Jamey Demchak,M.D. ?

## 2021-06-01 DIAGNOSIS — I4891 Unspecified atrial fibrillation: Secondary | ICD-10-CM | POA: Diagnosis not present

## 2021-06-01 DIAGNOSIS — N401 Enlarged prostate with lower urinary tract symptoms: Secondary | ICD-10-CM | POA: Diagnosis not present

## 2021-06-01 DIAGNOSIS — E7800 Pure hypercholesterolemia, unspecified: Secondary | ICD-10-CM | POA: Diagnosis not present

## 2021-07-15 DIAGNOSIS — Z08 Encounter for follow-up examination after completed treatment for malignant neoplasm: Secondary | ICD-10-CM | POA: Diagnosis not present

## 2021-07-15 DIAGNOSIS — L570 Actinic keratosis: Secondary | ICD-10-CM | POA: Diagnosis not present

## 2021-07-15 DIAGNOSIS — X32XXXD Exposure to sunlight, subsequent encounter: Secondary | ICD-10-CM | POA: Diagnosis not present

## 2021-07-15 DIAGNOSIS — L304 Erythema intertrigo: Secondary | ICD-10-CM | POA: Diagnosis not present

## 2021-07-15 DIAGNOSIS — D225 Melanocytic nevi of trunk: Secondary | ICD-10-CM | POA: Diagnosis not present

## 2021-07-15 DIAGNOSIS — Z85828 Personal history of other malignant neoplasm of skin: Secondary | ICD-10-CM | POA: Diagnosis not present

## 2021-07-15 DIAGNOSIS — Z1283 Encounter for screening for malignant neoplasm of skin: Secondary | ICD-10-CM | POA: Diagnosis not present

## 2021-07-19 ENCOUNTER — Other Ambulatory Visit: Payer: Self-pay | Admitting: Cardiovascular Disease

## 2021-07-19 DIAGNOSIS — I480 Paroxysmal atrial fibrillation: Secondary | ICD-10-CM

## 2021-07-19 DIAGNOSIS — Z5181 Encounter for therapeutic drug level monitoring: Secondary | ICD-10-CM

## 2021-07-19 NOTE — Telephone Encounter (Signed)
Pt last saw Dr Lovena Le 05/13/21, last labs 04/26/20 Creat 1.12, pt is overdue for labwork.  Age 76, weight 83.5kg, based on specified criteria pt is on appropriate dosage of Eliquis '5mg'$  BID for afib.  ?Called spoke with pt, pt states he will come into office for labwork tomorrow 07/20/21.  Orders placed in Epic and appt made.  Will await results to refill rx.  Pt states he has enough Eliquis to last until next week.   ?

## 2021-07-20 ENCOUNTER — Other Ambulatory Visit: Payer: Medicare Other | Admitting: *Deleted

## 2021-07-20 DIAGNOSIS — I480 Paroxysmal atrial fibrillation: Secondary | ICD-10-CM

## 2021-07-20 DIAGNOSIS — Z5181 Encounter for therapeutic drug level monitoring: Secondary | ICD-10-CM

## 2021-07-21 LAB — BASIC METABOLIC PANEL
BUN/Creatinine Ratio: 16 (ref 10–24)
BUN: 19 mg/dL (ref 8–27)
CO2: 23 mmol/L (ref 20–29)
Calcium: 9.1 mg/dL (ref 8.6–10.2)
Chloride: 105 mmol/L (ref 96–106)
Creatinine, Ser: 1.22 mg/dL (ref 0.76–1.27)
Glucose: 87 mg/dL (ref 70–99)
Potassium: 4.4 mmol/L (ref 3.5–5.2)
Sodium: 141 mmol/L (ref 134–144)
eGFR: 61 mL/min/{1.73_m2} (ref >59)

## 2021-07-21 LAB — CBC
Hematocrit: 44.3 % (ref 37.5–51.0)
Hemoglobin: 14.9 g/dL (ref 13.0–17.7)
MCH: 30.4 pg (ref 26.6–33.0)
MCHC: 33.6 g/dL (ref 31.5–35.7)
MCV: 90 fL (ref 79–97)
Platelets: 181 10*3/uL (ref 150–450)
RBC: 4.9 x10E6/uL (ref 4.14–5.80)
RDW: 12.5 % (ref 11.6–15.4)
WBC: 7.7 10*3/uL (ref 3.4–10.8)

## 2021-07-21 NOTE — Telephone Encounter (Signed)
Prescription refill request for Eliquis received. ?Indication: Afib  ?Last office visit: 05/13/21 Lovena Le) ?Scr: 1.22 (07/20/21) ?Age: 76 ?Weight: 83.'5mg'$  ? ?Appropriate dose and refill sent to requested pharmacy.  ? ? ? ? ? ?

## 2021-09-14 DIAGNOSIS — R972 Elevated prostate specific antigen [PSA]: Secondary | ICD-10-CM | POA: Diagnosis not present

## 2021-09-21 DIAGNOSIS — R972 Elevated prostate specific antigen [PSA]: Secondary | ICD-10-CM | POA: Diagnosis not present

## 2021-09-21 DIAGNOSIS — N400 Enlarged prostate without lower urinary tract symptoms: Secondary | ICD-10-CM | POA: Diagnosis not present

## 2021-09-21 DIAGNOSIS — N5201 Erectile dysfunction due to arterial insufficiency: Secondary | ICD-10-CM | POA: Diagnosis not present

## 2021-11-07 DIAGNOSIS — H401131 Primary open-angle glaucoma, bilateral, mild stage: Secondary | ICD-10-CM | POA: Diagnosis not present

## 2021-11-07 DIAGNOSIS — H524 Presbyopia: Secondary | ICD-10-CM | POA: Diagnosis not present

## 2021-11-07 DIAGNOSIS — H2513 Age-related nuclear cataract, bilateral: Secondary | ICD-10-CM | POA: Diagnosis not present

## 2021-12-09 DIAGNOSIS — Z23 Encounter for immunization: Secondary | ICD-10-CM | POA: Diagnosis not present

## 2021-12-23 DIAGNOSIS — L258 Unspecified contact dermatitis due to other agents: Secondary | ICD-10-CM | POA: Diagnosis not present

## 2021-12-23 DIAGNOSIS — L570 Actinic keratosis: Secondary | ICD-10-CM | POA: Diagnosis not present

## 2021-12-23 DIAGNOSIS — X32XXXD Exposure to sunlight, subsequent encounter: Secondary | ICD-10-CM | POA: Diagnosis not present

## 2021-12-23 DIAGNOSIS — L820 Inflamed seborrheic keratosis: Secondary | ICD-10-CM | POA: Diagnosis not present

## 2022-01-13 DIAGNOSIS — L720 Epidermal cyst: Secondary | ICD-10-CM | POA: Diagnosis not present

## 2022-01-14 ENCOUNTER — Other Ambulatory Visit: Payer: Self-pay | Admitting: Internal Medicine

## 2022-01-14 DIAGNOSIS — Z5181 Encounter for therapeutic drug level monitoring: Secondary | ICD-10-CM

## 2022-01-14 DIAGNOSIS — I480 Paroxysmal atrial fibrillation: Secondary | ICD-10-CM

## 2022-01-16 NOTE — Telephone Encounter (Signed)
Prescription refill request for Eliquis received. Indication:afib Last office visit:3/23 Scr:1.2 Age: 76 Weight:83.5 kg  Prescription refilled

## 2022-02-01 DIAGNOSIS — E7800 Pure hypercholesterolemia, unspecified: Secondary | ICD-10-CM | POA: Diagnosis not present

## 2022-02-01 DIAGNOSIS — Z79899 Other long term (current) drug therapy: Secondary | ICD-10-CM | POA: Diagnosis not present

## 2022-02-06 DIAGNOSIS — I4891 Unspecified atrial fibrillation: Secondary | ICD-10-CM | POA: Diagnosis not present

## 2022-02-06 DIAGNOSIS — E7800 Pure hypercholesterolemia, unspecified: Secondary | ICD-10-CM | POA: Diagnosis not present

## 2022-02-06 DIAGNOSIS — Z6826 Body mass index (BMI) 26.0-26.9, adult: Secondary | ICD-10-CM | POA: Diagnosis not present

## 2022-02-06 DIAGNOSIS — D6869 Other thrombophilia: Secondary | ICD-10-CM | POA: Diagnosis not present

## 2022-03-06 DIAGNOSIS — R519 Headache, unspecified: Secondary | ICD-10-CM | POA: Diagnosis not present

## 2022-03-06 DIAGNOSIS — J029 Acute pharyngitis, unspecified: Secondary | ICD-10-CM | POA: Diagnosis not present

## 2022-03-06 DIAGNOSIS — R0982 Postnasal drip: Secondary | ICD-10-CM | POA: Diagnosis not present

## 2022-04-14 ENCOUNTER — Ambulatory Visit (INDEPENDENT_AMBULATORY_CARE_PROVIDER_SITE_OTHER): Payer: Medicare Other

## 2022-04-14 ENCOUNTER — Ambulatory Visit (INDEPENDENT_AMBULATORY_CARE_PROVIDER_SITE_OTHER): Payer: Medicare Other | Admitting: Orthopedic Surgery

## 2022-04-14 DIAGNOSIS — M25572 Pain in left ankle and joints of left foot: Secondary | ICD-10-CM

## 2022-04-15 ENCOUNTER — Encounter: Payer: Self-pay | Admitting: Orthopedic Surgery

## 2022-04-15 NOTE — Progress Notes (Signed)
Office Visit Note   Patient: Robert Mclaughlin           Date of Birth: [birth year]           MRN: [id number] Visit Date: 04/14/2022 Requested by: Lawerance Cruel, [place],  [place] [address] PCP: Lawerance Cruel, MD  Subjective: Chief Complaint  Patient presents with   Left Knee - Pain    HPI: Robert Mclaughlin is a 77 y.o. male who presents to the office reporting left foot pain.  Denies any swelling or bruising.  Does a lot of walking for exercise.  Likes to golf.  Doing well from left total knee replacement.  Had some relatively acute onset of pain in December 2023 getting out of a car.  Has done better with the new pair shoes that have good arch support.  Some relief with brace as well.  Takes occasional Voltaren.  Describes lateral and anterior pain. 2 prior strokes.  Does take Eliquis..                ROS: All systems reviewed are negative as they relate to the chief complaint within the history of present illness.  Patient denies fevers or chills.  Assessment & Plan: Visit Diagnoses:  1. 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  Does have some primarily dorsal and lateral pain in the midfoot.  We talked about injections today he was to hold off on that.  I think if he can get shoes that have good arches that could decompress that dorsal side of the talonavicular joint.  Follow-up as needed.  Follow-Up Instructions: No follow-ups on file.   Orders:  Orders Placed This Encounter  Procedures   XR Foot Complete Left   No orders of the defined types were placed in this encounter.     Procedures: No procedures performed   Clinical Data: No additional findings.  Objective: Vital Signs: There were no vitals taken for this visit.  Physical Exam:  Constitutional: Patient appears well-developed HEENT:  Head: Normocephalic Eyes:EOM are normal Neck: Normal range of  motion Cardiovascular: Normal rate Pulmonary/chest: Effort normal Neurologic: Patient is alert Skin: Skin is warm Psychiatric: Patient has normal mood and affect  Ortho Exam: Ortho exam demonstrates normal gait and alignment.  Has palpable pedal pulses.  Palpable intact nontender anterior to posterior to peroneal and Achilles tendons.  Has good ankle dorsiflexion plantarflexion strength.  No real tenderness to palpation of the peroneal tendons or the fifth metatarsal.  Does have a little dorsal tenderness and slight swelling around the midfoot region.  Ankle is stable.  Specialty Comments:  No specialty comments available.  Imaging: No results found.   PMFS History: Patient Active Problem List   Diagnosis Date Noted   Paroxysmal atrial fibrillation ([place]) 09/16/2018   Carotid artery stenosis 07/23/2017   Cryptogenic stroke ([place]) 07/16/2017   Cerebellar stroke ([place]) 07/12/2017    Class: Acute   Orthostatic hypotension 07/12/2017    Class: Acute   PVC (premature ventricular contraction) 07/12/2017   Stroke (cerebrum) ([place]) 07/12/2017   Arthritis of knee 08/22/2016   Primary osteoarthritis of left knee 04/28/2016   PFO (patent foramen ovale) 04/11/2016   Stroke due to embolism of left middle cerebral artery ([place]) 03/15/2005   Past Medical History:  Diagnosis Date   A-fib ([place])    Arthritis    "left knee" (04/11/2016)  Carotid artery occlusion    Dysrhythmia    afib   Hyperlipidemia    Migraine equivalent    "none in years" (04/11/2016)   PFO (patent foramen ovale)     dx 2008; closed 03/2016 by Dr Burt Knack   Stroke [facility]) 2008   denies residual on 04/11/2016   UTI (urinary tract infection)     Family History  Problem Relation Age of Onset   Heart disease Mother        Heart Disease before age 16   Varicose Veins Mother    Hyperlipidemia Father     Past Surgical History:  Procedure Laterality Date   INGUINAL HERNIA REPAIR Right 08/15/2018   Procedure: RIGHT INGUINAL  HERNIA REPAIR WITH MESH;  Surgeon: Erroll Luna, MD;  Location: [place];  Service: General;  Laterality: Right;   INGUINAL HERNIA REPAIR Left 12/17/2019   Procedure: HERNIA REPAIR LEFT INGUINAL ADULT;  Surgeon: Erroll Luna, MD;  Location: [place];  Service: General;  Laterality: Left;   JOINT REPLACEMENT     LOOP RECORDER INSERTION N/A 07/16/2017   Procedure: LOOP RECORDER INSERTION;  Surgeon: Evans Lance, MD;  Location: [place] CV LAB;  Service: Cardiovascular;  Laterality: N/A;   MASS EXCISION Right 2017   "precancerous growth on my leg"   MOUTH SURGERY     tooth extraction and root extraction    PATENT FORAMEN OVALE CLOSURE  04/11/2016   PATENT FORAMEN OVALE CLOSURE N/A 04/11/2016   Procedure: Patent Forament [place](PFO) Closure;  Surgeon: Sherren Mocha, MD;  Location: [place] CV LAB;  Service: Cardiovascular;  Laterality: N/A;   TEE WITHOUT CARDIOVERSION N/A 07/16/2017   Procedure: TRANSESOPHAGEAL ECHOCARDIOGRAM (TEE);  Surgeon: Lelon Perla, MD;  Location: [place];  Service: Cardiovascular;  Laterality: N/A;   TONSILLECTOMY     TOOTH EXTRACTION     TOTAL KNEE ARTHROPLASTY Left 08/22/2016   Procedure: LEFT TOTAL KNEE ARTHROPLASTY;  Surgeon: Meredith Pel, MD;  Location: [place];  Service: Orthopedics;  Laterality: Left;   Social History   Occupational History   Not on file  Tobacco Use   Smoking status: Former    Packs/day: 1.00    Years: 6.00    Total pack years: 6.00    Types: Cigarettes    Quit date: 03/13/1969    Years since quitting: 53.1   Smokeless tobacco: Never  Vaping Use   Vaping Use: Never used  Substance and Sexual Activity   Alcohol use: Yes    Alcohol/week: 14.0 standard drinks of alcohol    Types: 14 Cans of beer per week    Comment: 2 drinks per day   Drug use: No   Sexual activity: Yes

## 2022-09-07 NOTE — Progress Notes (Signed)
  Electrophysiology Office Note:   Date:  09/08/2022  ID:  Robert Mclaughlin, DOB 1945/08/05, MRN [id number]  Primary Cardiologist: Charlton Haws, MD Electrophysiologist: None      History of Present Illness:   Robert Mclaughlin is a 77 y.o. male with h/o cryptogenic stroke s/p ILR & found to have PAF who returns 6/28 for routine electrophysiology followup.    Since last being seen in our clinic the patient reports doing well. He continues to walk daily, goes to the gym and regularly plays golf. States he was never really aware of his AF. He wears a smart watch and has not had any AF in the last year that he is aware. He denies chest pain, palpitations, dyspnea, PND, orthopnea, nausea, vomiting, dizziness, syncope, edema, weight gain, or early satiety.   Review of systems complete and found to be negative unless listed in HPI.   EP Information / Studies Reviewed:    EKG is ordered today. Personal review as below.  EKG Interpretation Date/Time:  Friday September 08 2022 10:27:14 EDT Ventricular Rate:  60 PR Interval:  142 QRS Duration:  98 QT Interval:  440 QTC Calculation: 440 R Axis:   -20  Text Interpretation: Normal sinus rhythm Incomplete right bundle branch block Minimal voltage criteria for LVH, may be normal variant ( R in aVL ) When compared with ECG of 12-Aug-2018 13:30, Premature ventricular complexes are no longer Present T wave inversion more evident in Inferior leads Confirmed by Canary Brim ([id number]) on 09/08/2022 10:32:33 AM     Risk Assessment/Calculations:    CHA2DS2-VASc Score = 4   This indicates a 4.8% annual risk of stroke. The patient's score is based upon: CHF History: 0 HTN History: 0 Diabetes History: 0 Stroke History: 2 Vascular Disease History: 0 Age Score: 2 Gender Score: 0             Physical Exam:   VS:  BP 116/72   Pulse 67   Ht 5\' 10"  (1.778 m)   Wt 186 lb (84.4 kg)   SpO2 96%   BMI 26.69 kg/m    Wt Readings from Last 3 Encounters:   09/08/22 186 lb (84.4 kg)  05/13/21 184 lb (83.5 kg)  04/28/20 183 lb 3.2 oz (83.1 kg)     GEN: Well nourished, well developed in no acute distress NECK: No JVD; No carotid bruits CARDIAC: Regular rate and rhythm, no murmurs, rubs, gallops RESPIRATORY:  Clear to auscultation without rales, wheezing or rhonchi  ABDOMEN: Soft, non-tender, non-distended EXTREMITIES:  No edema; No deformity   ASSESSMENT AND PLAN:    PAF  Asymptomatic  -continue anticoagulation, dosing reviewed and appropriate for Eliquis -check BMP, CBC for annual labs  Secondary Hypercoagulable State  -anticoagulation as above  ILR  Cryptogenic CVA  -device at end of life, pt has elected to not remove it.   HLD -simvastatin per primary    Follow up with Dr. Ladona Ridgel in 12 months or sooner if new needs arise.   Signed, Canary Brim, MSN, APRN, NP-C, AGACNP-BC [facility] - Electrophysiology  09/08/2022, 10:59 AM

## 2022-09-08 ENCOUNTER — Ambulatory Visit: Payer: Medicare Other | Attending: Student | Admitting: Pulmonary Disease

## 2022-09-08 ENCOUNTER — Encounter: Payer: Self-pay | Admitting: Student

## 2022-09-08 VITALS — BP 116/72 | HR 67 | Ht 70.0 in | Wt 186.0 lb

## 2022-09-08 DIAGNOSIS — I639 Cerebral infarction, unspecified: Secondary | ICD-10-CM | POA: Diagnosis not present

## 2022-09-08 DIAGNOSIS — I480 Paroxysmal atrial fibrillation: Secondary | ICD-10-CM

## 2022-09-08 DIAGNOSIS — D6869 Other thrombophilia: Secondary | ICD-10-CM

## 2022-09-08 NOTE — Addendum Note (Signed)
Addended by: Valrie Hart on: 09/08/2022 11:58 AM   Modules accepted: Orders

## 2022-09-08 NOTE — Patient Instructions (Signed)
Medication Instructions:  Your physician recommends that you continue on your current medications as directed. Please refer to the Current Medication list given to you today.  *If you need a refill on your cardiac medications before your next appointment, please call your pharmacy*  Lab Work: BMET, CBC--TODAY If you have labs (blood work) drawn today and your tests are completely normal, you will receive your results only by: MyChart Message (if you have MyChart) OR A paper copy in the mail If you have any lab test that is abnormal or we need to change your treatment, we will call you to review the results.  Follow-Up: At [facility], you and your health needs are our priority.  As part of our continuing mission to provide you with exceptional heart care, we have created designated Provider Care Teams.  These Care Teams include your primary Cardiologist (physician) and Advanced Practice Providers (APPs -  Physician Assistants and Nurse Practitioners) who all work together to provide you with the care you need, when you need it.  Your next appointment:   1 year(s)  Provider:   Gregg Taylor, MD  

## 2022-09-09 LAB — BASIC METABOLIC PANEL
BUN/Creatinine Ratio: 22 (ref 10–24)
BUN: 29 mg/dL — ABNORMAL HIGH (ref 8–27)
CO2: 22 mmol/L (ref 20–29)
Calcium: 8.6 mg/dL (ref 8.6–10.2)
Chloride: 104 mmol/L (ref 96–106)
Creatinine, Ser: 1.31 mg/dL — ABNORMAL HIGH (ref 0.76–1.27)
Glucose: 77 mg/dL (ref 70–99)
Potassium: 3.7 mmol/L (ref 3.5–5.2)
Sodium: 140 mmol/L (ref 134–144)
eGFR: 56 mL/min/{1.73_m2} — ABNORMAL LOW (ref >59)

## 2022-09-09 LAB — CBC
Hematocrit: 42.6 % (ref 37.5–51.0)
Hemoglobin: 13.9 g/dL (ref 13.0–17.7)
MCH: 29.8 pg (ref 26.6–33.0)
MCHC: 32.6 g/dL (ref 31.5–35.7)
MCV: 91 fL (ref 79–97)
Platelets: 192 10*3/uL (ref 150–450)
RBC: 4.67 x10E6/uL (ref 4.14–5.80)
RDW: 13 % (ref 11.6–15.4)
WBC: 10.4 10*3/uL (ref 3.4–10.8)

## 2022-11-07 ENCOUNTER — Other Ambulatory Visit: Payer: Self-pay | Admitting: *Deleted

## 2022-11-07 DIAGNOSIS — I6529 Occlusion and stenosis of unspecified carotid artery: Secondary | ICD-10-CM

## 2022-11-20 ENCOUNTER — Ambulatory Visit (INDEPENDENT_AMBULATORY_CARE_PROVIDER_SITE_OTHER): Payer: Medicare Other | Admitting: Surgery

## 2022-11-20 ENCOUNTER — Encounter: Payer: Self-pay | Admitting: Surgery

## 2022-11-20 ENCOUNTER — Ambulatory Visit (HOSPITAL_COMMUNITY)
Admission: RE | Admit: 2022-11-20 | Discharge: 2022-11-20 | Disposition: A | Discharge status: Home / Self Care | Payer: Medicare Other | Source: Ambulatory Visit | Attending: Surgery | Admitting: Surgery

## 2022-11-20 VITALS — BP 122/76 | HR 73 | Temp 98.0°F | Resp 20 | Ht 70.0 in | Wt 187.0 lb

## 2022-11-20 DIAGNOSIS — I6529 Occlusion and stenosis of unspecified carotid artery: Secondary | ICD-10-CM | POA: Insufficient documentation

## 2022-11-20 DIAGNOSIS — I6523 Occlusion and stenosis of bilateral carotid arteries: Secondary | ICD-10-CM | POA: Diagnosis not present

## 2022-11-20 NOTE — Progress Notes (Signed)
[facility] [place]  Patient name: Robert Mclaughlin MRN: [id number] DOB: December 19, 1945 Sex: male   REASON FOR VISIT:    Follow up  HISOTRY OF PRESENT ILLNESS:    Robert Mclaughlin is a 77 y.o. male who returns today for follow-up of his carotid disease.  He has a history of stroke in 2008.  His symptoms consisted of stumbling and balance.  They resolved within 2 weeks.  CT angiogram did not show any large vessel disease.  He is back today for follow-up.  He denies any new neurologic symptoms.  Specifically he denies numbness or weakness in either extremity.  He denies slurred speech.  He denies amaurosis fugax.  Patient has been diagnosed with atrial fibrillation.  He is on Eliquis.  He takes a statin for hypercholesterolemia.   PAST MEDICAL HISTORY:   Past Medical History:  Diagnosis Date   A-fib [facility])    Arthritis    "left knee" (04/11/2016)   Carotid artery occlusion    Dysrhythmia    afib   Hyperlipidemia    Migraine equivalent    "none in years" (04/11/2016)   PFO (patent foramen ovale)     dx 2008; closed 03/2016 by Dr Excell Seltzer   Stroke [facility]) 2008   denies residual on 04/11/2016   UTI (urinary tract infection)      FAMILY HISTORY:   Family History  Problem Relation Age of Onset   Heart disease Mother        Heart Disease before age 67   Varicose Veins Mother    Hyperlipidemia Father     SOCIAL HISTORY:   Social History   Tobacco Use   Smoking status: Former    Current packs/day: 0.00    Average packs/day: 1 pack/day for 6.0 years (6.0 ttl pk-yrs)    Types: Cigarettes    Start date: 03/14/1963    Quit date: 03/13/1969    Years since quitting: 53.7   Smokeless tobacco: Never  Substance Use Topics   Alcohol use: Yes    Alcohol/week: 14.0 standard drinks of alcohol    Types: 14 Cans of beer per week    Comment: 2 drinks per day     ALLERGIES:   Allergies  Allergen Reactions   Oxycodone      Rash/hives   Shellfish Allergy    Latanoprost Other (See Comments)    Ineffective    Lumigan [Bimatoprost] Other (See Comments)    Ineffective     CURRENT MEDICATIONS:   Current Outpatient Medications  Medication Sig Dispense Refill   ARTIFICIAL TEAR OP Place 1 drop into both eyes daily.      diclofenac sodium (VOLTAREN) 1 % GEL APPLY 1 APPLICATION TOPICALLY 4 (FOUR) TIMES DAILY AS NEEDED (MUSCLE PAIN). 100 g 3   ELIQUIS 5 MG TABS tablet TAKE 1 TABLET BY MOUTH TWICE A DAY 180 tablet 1   EPINEPHrine 0.3 mg/0.3 mL IJ SOAJ injection Inject 1 mL into the skin as needed for allergies.     Multiple Vitamin (MULTIVITAMIN) capsule Take 1 capsule by mouth daily.     simvastatin (ZOCOR) 20 MG tablet Take 20 mg by mouth at bedtime.      No current facility-administered medications for this visit.    REVIEW OF SYSTEMS:   [X]  denotes positive finding, [ ]  denotes negative finding Cardiac  Comments:  Chest pain or chest pressure:    Shortness of breath upon exertion:    Short of breath when lying flat:  Irregular heart rhythm:        Vascular    Pain in calf, thigh, or hip brought on by ambulation:    Pain in feet at night that wakes you up from your sleep:     Blood clot in your veins:    Leg swelling:         Pulmonary    Oxygen at home:    Productive cough:     Wheezing:         Neurologic    Sudden weakness in arms or legs:     Sudden numbness in arms or legs:     Sudden onset of difficulty speaking or slurred speech:    Temporary loss of vision in one eye:     Problems with dizziness:         Gastrointestinal    Blood in stool:     Vomited blood:         Genitourinary    Burning when urinating:     Blood in urine:        Psychiatric    Major depression:         Hematologic    Bleeding problems:    Problems with blood clotting too easily:        Skin    Rashes or ulcers:        Constitutional    Fever or chills:      PHYSICAL EXAM:   Vitals:    11/20/22 1400 11/20/22 1402  BP: 124/76 122/76  Pulse: 73   Resp: 20   Temp: 98 F (36.7 C)   SpO2: 96%   Weight: 187 lb (84.8 kg)   Height: 5\' 10"  (1.778 m)     GENERAL: The patient is a well-nourished male, in no acute distress. The vital signs are documented above. CARDIAC: There is a regular rate and rhythm.  PULMONARY: Non-labored respirations MUSCULOSKELETAL: There are no major deformities or cyanosis. NEUROLOGIC: No focal weakness or paresthesias are detected. SKIN: There are no ulcers or rashes noted. PSYCHIATRIC: The patient has a normal affect.  STUDIES:   I have reviewed the following carotid duplex:  Right Carotid: Velocities in the right ICA are consistent with a 1-39%  stenosis.   Left Carotid: Velocities in the left ICA are consistent with a 1-39%  stenosis.   Vertebrals: Bilateral vertebral arteries demonstrate antegrade flow.   MEDICAL ISSUES:   Carotid: The patient has a history of stroke in 2008.  I suspect this was from A-fib which she is now anticoagulated.  He has also had closure of a PFO.  He has had his carotids evaluated multiple times without any significant disease identified.  Again today he has 1-39% stenosis.  We discussed that I do not think we need to further monitor this.  He is okay with that.  He will follow-up on an as-needed basis.    Charlena Cross, MD, FACS [facility] [phone] Pager [phone]

## 2023-05-30 ENCOUNTER — Telehealth: Payer: Self-pay | Admitting: *Deleted

## 2023-05-30 NOTE — Telephone Encounter (Signed)
   Pre-operative Risk Assessment    Patient Name: Robert Mclaughlin  DOB: Sep 23, 1945 MRN: [id number]   Date of last office visit: 09/08/22 Canary Brim, NP Date of next office visit: NONE   Request for Surgical Clearance    Procedure:   COLONOSCOPY  Date of Surgery:  Clearance 06/14/23                                Surgeon:  DR. Matthias Hughs Surgeon's Group or Practice Name:  [facility] Phone number:  [phone] Fax number:  [phone]   Type of Clearance Requested:   - Medical  - Pharmacy:  Hold Apixaban (Eliquis) (PER FORM: PLEASE ADVISE ON STOP AND START DATE)   Type of Anesthesia:  Not Indicated   Additional requests/questions:    Elpidio Anis   05/30/2023, 5:14 PM

## 2023-05-31 ENCOUNTER — Telehealth: Payer: Self-pay | Admitting: *Deleted

## 2023-05-31 NOTE — Telephone Encounter (Signed)
 Pt has been scheduled tele preop appt 06/06/23. Med rec and consent are done.      Patient Consent for Virtual Visit        JAHAAN VANWAGNER has provided verbal consent on 05/31/2023 for a virtual visit (video or telephone).   CONSENT FOR VIRTUAL VISIT FOR:  Milus Banister  By participating in this virtual visit I agree to the following:  I hereby voluntarily request, consent and authorize [facility] and its employed or contracted physicians, physician assistants, nurse practitioners or other licensed health care professionals (the Practitioner), to provide me with telemedicine health care services (the "Services") as deemed necessary by the treating Practitioner. I acknowledge and consent to receive the Services by the Practitioner via telemedicine. I understand that the telemedicine visit will involve communicating with the Practitioner through live audiovisual communication technology and the disclosure of certain medical information by electronic transmission. I acknowledge that I have been given the opportunity to request an in-person assessment or other available alternative prior to the telemedicine visit and am voluntarily participating in the telemedicine visit.  I understand that I have the right to withhold or withdraw my consent to the use of telemedicine in the course of my care at any time, without affecting my right to future care or treatment, and that the Practitioner or I may terminate the telemedicine visit at any time. I understand that I have the right to inspect all information obtained and/or recorded in the course of the telemedicine visit and may receive copies of available information for a reasonable fee.  I understand that some of the potential risks of receiving the Services via telemedicine include:  Delay or interruption in medical evaluation due to technological equipment failure or disruption; Information transmitted may not be sufficient (e.g. poor  resolution of images) to allow for appropriate medical decision making by the Practitioner; and/or  In rare instances, security protocols could fail, causing a breach of personal health information.  Furthermore, I acknowledge that it is my responsibility to provide information about my medical history, conditions and care that is complete and accurate to the best of my ability. I acknowledge that Practitioner's advice, recommendations, and/or decision may be based on factors not within their control, such as incomplete or inaccurate data provided by me or distortions of diagnostic images or specimens that may result from electronic transmissions. I understand that the practice of medicine is not an exact science and that Practitioner makes no warranties or guarantees regarding treatment outcomes. I acknowledge that a copy of this consent can be made available to me via my patient portal [facility] MyChart), or I can request a printed copy by calling the office of [facility].    I understand that my insurance will be billed for this visit.   I have read or had this consent read to me. I understand the contents of this consent, which adequately explains the benefits and risks of the Services being provided via telemedicine.  I have been provided ample opportunity to ask questions regarding this consent and the Services and have had my questions answered to my satisfaction. I give my informed consent for the services to be provided through the use of telemedicine in my medical care

## 2023-05-31 NOTE — Telephone Encounter (Signed)
   Name: Robert Mclaughlin  DOB: [birth year]  MRN: [id number]  Primary Cardiologist: Charlton Haws, MD   Preoperative team, please contact this patient and set up a phone call appointment for further preoperative risk assessment. Please obtain consent and complete medication review. Thank you for your help.  I confirm that guidance regarding antiplatelet and oral anticoagulation therapy has been completed and, if necessary, noted below.  Due to history of stroke, recommend holding Eliquis for 1 day   Patient /will not need bridging with Lovenox (enoxaparin) around procedure.  Please resume as soon as hemostasis is achieved.  I also confirmed the patient resides in the state of [place]. As per [facility] telemedicine laws, the patient must reside in the state in which the provider is licensed.   Ronney Asters, NP 05/31/2023, 12:51 PM [facility]

## 2023-05-31 NOTE — Telephone Encounter (Signed)
 Pt has been scheduled tele preop appt 06/06/23. Med rec and consent are done.

## 2023-05-31 NOTE — Telephone Encounter (Signed)
 Patient with diagnosis of A Fib on Eliquis for anticoagulation.    Procedure: colonoscopy Date of procedure: 06/14/23   CHA2DS2-VASc Score = 5  This indicates a 7.2% annual risk of stroke. The patient's score is based upon: CHF History: 0 HTN History: 0 Diabetes History: 0 Stroke History: 2 Vascular Disease History: 1 Age Score: 2 Gender Score: 0    CrCl 57 ml/min Platelet count 192K  Due to history of stroke, recommend holding Eliquis for 1 day  Patient /will not need bridging with Lovenox (enoxaparin) around procedure.  **This guidance is not considered finalized until pre-operative APP has relayed final recommendations.**

## 2023-06-06 ENCOUNTER — Ambulatory Visit: Attending: General Practice | Admitting: Nurse Practitioner

## 2023-06-06 DIAGNOSIS — Z01810 Encounter for preprocedural cardiovascular examination: Secondary | ICD-10-CM

## 2023-06-06 NOTE — Progress Notes (Signed)
 Virtual Visit via Telephone Note   Because of Robert Mclaughlin co-morbid illnesses, he is at least at moderate risk for complications without adequate follow up.  This format is felt to be most appropriate for this patient at this time.  Due to technical limitations with video connection Web designer), today's appointment will be conducted as an audio only telehealth visit, and Robert Mclaughlin verbally agreed to proceed in this manner.   All issues noted in this document were discussed and addressed.  No physical exam could be performed with this format.  Evaluation Performed:  Preoperative cardiovascular risk assessment _____________   Date:  06/06/2023   Patient ID:  Robert Mclaughlin, DOB [birth year], MRN [id number] Patient Location:  Home Provider location:   Office  Primary Care Provider:  Daisy Floro, MD Primary Cardiologist:  Charlton Haws, MD  Chief Complaint / Patient Profile   78 y.o. y/o male with a h/o cryptogenic stroke s/p ILR implant found to have atrial fibrillation on chronic anticoagulation for stroke prevention, prior PFO closure in 2019, hyperlipidemia who is pending colonoscopy with Dr. Matthias Mclaughlin on 06/14/23 and presents today for telephonic preoperative cardiovascular risk assessment.  History of Present Illness    Robert Mclaughlin is a 78 y.o. male who presents via audio/video conferencing for a telehealth visit today.  Pt was last seen in cardiology clinic on 09/08/22 by Canary Brim, NP.  At that time Robert Mclaughlin was doing well.  The patient is now pending procedure as outlined above. Since his last visit, he denies chest pain, shortness of breath, lower extremity edema, fatigue, palpitations, melena, hematuria, hemoptysis, diaphoresis, weakness, presyncope, syncope, orthopnea, and PND. He remains very active with regular walking, weight lifting and does not have any concerning cardiac symptoms.   Past Medical History    Past Medical History:   Diagnosis Date   A-fib [facility])    Arthritis    "left knee" (04/11/2016)   Carotid artery occlusion    Dysrhythmia    afib   Hyperlipidemia    Migraine equivalent    "none in years" (04/11/2016)   PFO (patent foramen ovale)     dx 2008; closed 03/2016 by Dr Excell Seltzer   Stroke [facility]) 2008   denies residual on 04/11/2016   UTI (urinary tract infection)    Past Surgical History:  Procedure Laterality Date   INGUINAL HERNIA REPAIR Right 08/15/2018   Procedure: RIGHT INGUINAL HERNIA REPAIR WITH MESH;  Surgeon: Harriette Bouillon, MD;  Location: [facility];  Service: General;  Laterality: Right;   INGUINAL HERNIA REPAIR Left 12/17/2019   Procedure: HERNIA REPAIR LEFT INGUINAL ADULT;  Surgeon: Harriette Bouillon, MD;  Location: [facility];  Service: General;  Laterality: Left;   JOINT REPLACEMENT     LOOP RECORDER INSERTION N/A 07/16/2017   Procedure: LOOP RECORDER INSERTION;  Surgeon: Marinus Maw, MD;  Location: [facility] INVASIVE CV LAB;  Service: Cardiovascular;  Laterality: N/A;   MASS EXCISION Right 2017   "precancerous growth on my leg"   MOUTH SURGERY     tooth extraction and root extraction    PATENT FORAMEN OVALE CLOSURE  04/11/2016   PATENT FORAMEN OVALE CLOSURE N/A 04/11/2016   Procedure: Patent [place]ament [place](PFO) Closure;  Surgeon: Tonny Bollman, MD;  Location: [facility] INVASIVE CV LAB;  Service: Cardiovascular;  Laterality: N/A;   TEE WITHOUT CARDIOVERSION N/A 07/16/2017   Procedure: TRANSESOPHAGEAL ECHOCARDIOGRAM (TEE);  Surgeon: Lewayne Bunting, MD;  Location: [facility] ENDOSCOPY;  Service: Cardiovascular;  Laterality: N/A;   TONSILLECTOMY     TOOTH EXTRACTION     TOTAL KNEE ARTHROPLASTY Left 08/22/2016   Procedure: LEFT TOTAL KNEE ARTHROPLASTY;  Surgeon: Cammy Copa, MD;  Location: [facility] OR;  Service: Orthopedics;  Laterality: Left;    Allergies  Allergies  Allergen Reactions   Oxycodone     Rash/hives   Shellfish Allergy    Latanoprost Other (See Comments)     Ineffective    Lumigan [Bimatoprost] Other (See Comments)    Ineffective    Home Medications    Prior to Admission medications   Medication Sig Start Date End Date Taking? Authorizing Provider  ARTIFICIAL TEAR OP Place 1 drop into both eyes daily.     [provider]  diclofenac sodium (VOLTAREN) 1 % GEL APPLY 1 APPLICATION TOPICALLY 4 (FOUR) TIMES DAILY AS NEEDED (MUSCLE PAIN). Patient not taking: Reported on 05/31/2023 11/30/16   Cammy Copa, MD  ELIQUIS 5 MG TABS tablet TAKE 1 TABLET BY MOUTH TWICE A DAY 01/16/22   Marinus Maw, MD  EPINEPHrine 0.3 mg/0.3 mL IJ SOAJ injection Inject 1 mL into the skin as needed for allergies. Patient not taking: Reported on 05/31/2023 04/08/18   [provider]  Multiple Vitamin (MULTIVITAMIN) capsule Take 1 capsule by mouth daily.    [provider]  simvastatin (ZOCOR) 20 MG tablet Take 20 mg by mouth at bedtime.     [provider]    Physical Exam    Vital Signs:  Robert Mclaughlin does not have vital signs available for review today.  Given telephonic nature of communication, physical exam is limited. AAOx3. NAD. Normal affect.  Speech and respirations are unlabored.  Accessory Clinical Findings    None  Assessment & Plan    1.  Preoperative Cardiovascular Risk Assessment: According to the Revised Cardiac Risk Index (RCRI), his Perioperative Risk of Major Cardiac Event is (%): 0.9. His Functional Capacity in METs is: 7.77 according to the Duke Activity Status Index (DASI). The patient is doing well from a cardiac perspective. Therefore, based on ACC/AHA guidelines, the patient would be at acceptable risk for the planned procedure without further cardiovascular testing.   The patient was advised that if he develops new symptoms prior to surgery to contact our office to arrange for a follow-up visit, and he verbalized understanding.  Due to history of stroke, recommend holding Eliquis for 1  day Patient /will not need bridging with Lovenox (enoxaparin) around procedure.  A copy of this note will be routed to requesting surgeon.  Time:   Today, I have spent 10 minutes with the patient with telehealth technology discussing medical history, symptoms, and management plan.    Levi Aland, NP-C  06/06/2023, 1:44 PM [address] Office [phone] Fax [phone]

## 2024-06-06 ENCOUNTER — Ambulatory Visit: Admitting: Pulmonary Disease
